# Patient Record
Sex: Male | Born: 1940 | Race: White | Hispanic: No | Smoking: Never smoker
Health system: Southern US, Community
[De-identification: ages and names within clinical notes are randomized; demographics above are authoritative.]

## PROBLEM LIST (undated history)

## (undated) DIAGNOSIS — N1831 Chronic kidney disease, stage 3a: Secondary | ICD-10-CM

## (undated) DIAGNOSIS — D485 Neoplasm of uncertain behavior of skin: Secondary | ICD-10-CM

## (undated) DIAGNOSIS — G40909 Epilepsy, unspecified, not intractable, without status epilepticus: Secondary | ICD-10-CM

## (undated) DIAGNOSIS — L03032 Cellulitis of left toe: Secondary | ICD-10-CM

## (undated) DIAGNOSIS — S46919A Strain of unspecified muscle, fascia and tendon at shoulder and upper arm level, unspecified arm, initial encounter: Secondary | ICD-10-CM

## (undated) DIAGNOSIS — E1140 Type 2 diabetes mellitus with diabetic neuropathy, unspecified: Secondary | ICD-10-CM

## (undated) DIAGNOSIS — E7800 Pure hypercholesterolemia, unspecified: Secondary | ICD-10-CM

## (undated) DIAGNOSIS — S239XXA Sprain of unspecified parts of thorax, initial encounter: Secondary | ICD-10-CM

## (undated) DIAGNOSIS — M1990 Unspecified osteoarthritis, unspecified site: Secondary | ICD-10-CM

## (undated) DIAGNOSIS — N183 Chronic kidney disease, stage 3 (moderate): Secondary | ICD-10-CM

## (undated) DIAGNOSIS — I10 Essential (primary) hypertension: Secondary | ICD-10-CM

## (undated) DIAGNOSIS — R0602 Shortness of breath: Secondary | ICD-10-CM

## (undated) DIAGNOSIS — N200 Calculus of kidney: Secondary | ICD-10-CM

## (undated) DIAGNOSIS — N393 Stress incontinence (female) (male): Secondary | ICD-10-CM

## (undated) DIAGNOSIS — M549 Dorsalgia, unspecified: Secondary | ICD-10-CM

## (undated) DIAGNOSIS — Z8546 Personal history of malignant neoplasm of prostate: Secondary | ICD-10-CM

## (undated) DIAGNOSIS — I2510 Atherosclerotic heart disease of native coronary artery without angina pectoris: Secondary | ICD-10-CM

## (undated) DIAGNOSIS — N3941 Urge incontinence: Secondary | ICD-10-CM

## (undated) DIAGNOSIS — C679 Malignant neoplasm of bladder, unspecified: Secondary | ICD-10-CM

## (undated) DIAGNOSIS — K219 Gastro-esophageal reflux disease without esophagitis: Secondary | ICD-10-CM

## (undated) DIAGNOSIS — S139XXA Sprain of joints and ligaments of unspecified parts of neck, initial encounter: Secondary | ICD-10-CM

## (undated) DIAGNOSIS — G4730 Sleep apnea, unspecified: Secondary | ICD-10-CM

## (undated) DIAGNOSIS — L600 Ingrowing nail: Secondary | ICD-10-CM

## (undated) HISTORY — DX: Atherosclerotic heart disease of native coronary artery without angina pectoris: I25.10

## (undated) HISTORY — DX: Cellulitis of left toe: L03.032

## (undated) HISTORY — DX: Chronic kidney disease, stage 3 (moderate): N18.3

## (undated) HISTORY — PX: URETEROSCOPY WITH HOLMIUM LASER LITHOTRIPSY: SHX6645

## (undated) HISTORY — DX: Malignant neoplasm of bladder, unspecified: C67.9

## (undated) HISTORY — DX: Ingrowing nail: L60.0

## (undated) HISTORY — DX: Pure hypercholesterolemia, unspecified: E78.00

## (undated) HISTORY — DX: Sprain of joints and ligaments of unspecified parts of neck, initial encounter: S13.9XXA

## (undated) HISTORY — DX: Sleep apnea, unspecified: G47.30

## (undated) HISTORY — DX: Type 2 diabetes mellitus with diabetic neuropathy, unspecified: E11.40

## (undated) HISTORY — DX: Essential (primary) hypertension: I10

## (undated) HISTORY — DX: Neoplasm of uncertain behavior of skin: D48.5

## (undated) HISTORY — DX: Personal history of malignant neoplasm of prostate: Z85.46

## (undated) HISTORY — DX: Calculus of kidney: N20.0

## (undated) HISTORY — DX: Sprain of unspecified parts of thorax, initial encounter: S23.9XXA

## (undated) HISTORY — DX: Stress incontinence (female) (male): N39.3

## (undated) HISTORY — PX: TRANSURETHRAL RESECTION OF BLADDER TUMOR WITH GYRUS (TURBT-GYRUS): SHX6458

## (undated) HISTORY — DX: Dorsalgia, unspecified: M54.9

## (undated) HISTORY — DX: Epilepsy, unspecified, not intractable, without status epilepticus: G40.909

## (undated) HISTORY — DX: Strain of unspecified muscle, fascia and tendon at shoulder and upper arm level, unspecified arm, initial encounter: S46.919A

## (undated) HISTORY — DX: Shortness of breath: R06.02

## (undated) HISTORY — DX: Urge incontinence: N39.41

---

## 2002-11-30 HISTORY — PX: PROSTATECTOMY: SHX69

## 2004-11-30 HISTORY — PX: CARDIAC SURGERY: SHX584

## 2014-11-30 HISTORY — PX: REPLACEMENT TOTAL KNEE: SUR1224

## 2017-05-17 ENCOUNTER — Ambulatory Visit (INDEPENDENT_AMBULATORY_CARE_PROVIDER_SITE_OTHER): Payer: Medicare Other | Admitting: Podiatry

## 2017-05-17 DIAGNOSIS — E1142 Type 2 diabetes mellitus with diabetic polyneuropathy: Secondary | ICD-10-CM

## 2017-05-17 DIAGNOSIS — M2041 Other hammer toe(s) (acquired), right foot: Secondary | ICD-10-CM

## 2017-05-17 DIAGNOSIS — M2042 Other hammer toe(s) (acquired), left foot: Secondary | ICD-10-CM

## 2017-05-17 NOTE — Progress Notes (Signed)
Subjective:     Patient ID: Elijah Oliver, male   DOB: [birth year], 76 y.o.   MRN: [id number]  HPI this patient presents the office for an evaluation of his diabetic feet. This patient is interested in obtaining diabetic footgear. He says he has moved from [place] and it is time for him to receive a new pair of diabetic shoes   Review of Systems     Objective:   Physical Exam GENERAL APPEARANCE: Alert, conversant. Appropriately groomed. No acute distress.  VASCULAR: Pedal pulses are  palpable at  [facility] and PT bilateral.  Capillary refill time is immediate to all digits,  Normal temperature gradient.  Digital hair growth is present bilateral  NEUROLOGIC: sensation is absent  to 5.07 monofilament at 5/5 sites bilateral.  Light touch is absent  bilateral, Muscle strength normal.  MUSCULOSKELETAL: acceptable muscle strength, tone and stability bilateral.  Hallux malleus  B/L  Hammer toes  B/l  DERMATOLOGIC: skin color, texture, and turgor are within normal limits.  No preulcerative lesions or ulcers  are seen, no interdigital maceration noted.  No open lesions present.  Digital nails are asymptomatic. No drainage noted.      Assessment:    Diabetic neuropathy      Plan:     Initial exam tissue was examined and is to obtain diabetic shoes. Patient is diagnosed with TPN and hammertoes and hallux malleus.  Patient to be called when the shoes arrive   Gardiner Barefoot DPM

## 2017-06-17 ENCOUNTER — Ambulatory Visit (INDEPENDENT_AMBULATORY_CARE_PROVIDER_SITE_OTHER): Payer: Medicare Other | Admitting: Podiatry

## 2017-06-17 DIAGNOSIS — M2041 Other hammer toe(s) (acquired), right foot: Secondary | ICD-10-CM | POA: Diagnosis not present

## 2017-06-17 DIAGNOSIS — M2042 Other hammer toe(s) (acquired), left foot: Secondary | ICD-10-CM | POA: Diagnosis not present

## 2017-06-17 DIAGNOSIS — E1142 Type 2 diabetes mellitus with diabetic polyneuropathy: Secondary | ICD-10-CM | POA: Diagnosis not present

## 2017-06-17 NOTE — Progress Notes (Signed)
Patient ID: Elijah Oliver, male   DOB: [birth year], 75 y.o.   MRN: [id number] Patient presents for diabetic shoe pick up, shoes are tried on for good fit.  Patient received 1 Pair and 3 pairs custom molded diabetic inserts.  Verbal and written break in and wear instructions given.  Patient will follow up for scheduled routine care  This patient presents the office to pick up his diabetic shoes.   GENERAL APPEARANCE: Alert, conversant. Appropriately groomed. No acute distress.  VASCULAR: Pedal pulses are  palpable at  [facility] and PT bilateral.  Capillary refill time is immediate to all digits,  Normal temperature gradient.  Digital hair growth is present bilateral  NEUROLOGIC: sensation is normal to 5.07 monofilament at 5/5 sites bilateral.  Light touch is intact bilateral, Muscle strength normal.  MUSCULOSKELETAL: acceptable muscle strength, tone and stability bilateral.  Hallux malleus  Hammer toes  B/L  DERMATOLOGIC: skin color, texture, and turgor are within normal limits.  No preulcerative lesions or ulcers  are seen, no interdigital maceration noted.  No open lesions present.  Digital nails are asymptomatic. No drainage noted.  DPN  Hallux malleus  Hammer toes  ROV  Dispense shoes.  Patient presents today and was dispensed 0ne pair ( two units) of medically necessary extra depth shoes with three pair( six units) of custom molded multiple density inserts. The shoes and the inserts are fitted to the patients ' feet and are noted to fit well and are free of defect.  Length and width of the shoes are also acceptable.  Patient was given written and verbal  instructions for wearing.  If any concerns arrive with the shoes or inserts, the patient is to call the office.Patient is to follow up with doctor in six weeks.   Gardiner Barefoot DPM

## 2017-06-17 NOTE — Patient Instructions (Signed)

## 2017-07-02 DIAGNOSIS — I25810 Atherosclerosis of coronary artery bypass graft(s) without angina pectoris: Secondary | ICD-10-CM | POA: Insufficient documentation

## 2017-07-02 DIAGNOSIS — G40909 Epilepsy, unspecified, not intractable, without status epilepticus: Secondary | ICD-10-CM

## 2017-07-02 DIAGNOSIS — Z8546 Personal history of malignant neoplasm of prostate: Secondary | ICD-10-CM

## 2017-07-02 DIAGNOSIS — E7800 Pure hypercholesterolemia, unspecified: Secondary | ICD-10-CM

## 2017-07-02 DIAGNOSIS — E1140 Type 2 diabetes mellitus with diabetic neuropathy, unspecified: Secondary | ICD-10-CM

## 2017-07-02 DIAGNOSIS — E669 Obesity, unspecified: Secondary | ICD-10-CM | POA: Insufficient documentation

## 2017-07-02 HISTORY — DX: Epilepsy, unspecified, not intractable, without status epilepticus: G40.909

## 2017-07-02 HISTORY — DX: Type 2 diabetes mellitus with diabetic neuropathy, unspecified: E11.40

## 2017-07-02 HISTORY — DX: Personal history of malignant neoplasm of prostate: Z85.46

## 2017-07-02 HISTORY — DX: Pure hypercholesterolemia, unspecified: E78.00

## 2017-07-06 DIAGNOSIS — N183 Chronic kidney disease, stage 3 unspecified: Secondary | ICD-10-CM | POA: Insufficient documentation

## 2017-07-06 HISTORY — DX: Chronic kidney disease, stage 3 unspecified: N18.30

## 2017-07-13 DIAGNOSIS — R569 Unspecified convulsions: Secondary | ICD-10-CM | POA: Insufficient documentation

## 2017-07-22 ENCOUNTER — Other Ambulatory Visit: Payer: Self-pay

## 2017-07-22 DIAGNOSIS — R32 Unspecified urinary incontinence: Secondary | ICD-10-CM

## 2017-07-23 ENCOUNTER — Encounter: Payer: Self-pay | Admitting: Urology

## 2017-07-23 ENCOUNTER — Ambulatory Visit (INDEPENDENT_AMBULATORY_CARE_PROVIDER_SITE_OTHER): Payer: Medicare Other | Admitting: Urology

## 2017-07-23 ENCOUNTER — Other Ambulatory Visit
Admission: RE | Admit: 2017-07-23 | Discharge: 2017-07-23 | Disposition: A | Discharge status: Home / Self Care | Payer: Medicare Other | Source: Ambulatory Visit | Attending: Urology | Admitting: Urology

## 2017-07-23 VITALS — BP 143/62 | HR 65 | Ht 70.0 in | Wt 214.0 lb

## 2017-07-23 DIAGNOSIS — R3915 Urgency of urination: Secondary | ICD-10-CM

## 2017-07-23 DIAGNOSIS — M7989 Other specified soft tissue disorders: Secondary | ICD-10-CM | POA: Insufficient documentation

## 2017-07-23 DIAGNOSIS — N393 Stress incontinence (female) (male): Secondary | ICD-10-CM

## 2017-07-23 DIAGNOSIS — Z8546 Personal history of malignant neoplasm of prostate: Secondary | ICD-10-CM

## 2017-07-23 DIAGNOSIS — S46919A Strain of unspecified muscle, fascia and tendon at shoulder and upper arm level, unspecified arm, initial encounter: Secondary | ICD-10-CM | POA: Insufficient documentation

## 2017-07-23 DIAGNOSIS — S239XXA Sprain of unspecified parts of thorax, initial encounter: Secondary | ICD-10-CM | POA: Insufficient documentation

## 2017-07-23 DIAGNOSIS — I2510 Atherosclerotic heart disease of native coronary artery without angina pectoris: Secondary | ICD-10-CM | POA: Insufficient documentation

## 2017-07-23 DIAGNOSIS — E119 Type 2 diabetes mellitus without complications: Secondary | ICD-10-CM | POA: Insufficient documentation

## 2017-07-23 DIAGNOSIS — N3941 Urge incontinence: Secondary | ICD-10-CM

## 2017-07-23 DIAGNOSIS — R32 Unspecified urinary incontinence: Secondary | ICD-10-CM

## 2017-07-23 DIAGNOSIS — Z8551 Personal history of malignant neoplasm of bladder: Secondary | ICD-10-CM

## 2017-07-23 DIAGNOSIS — N200 Calculus of kidney: Secondary | ICD-10-CM

## 2017-07-23 DIAGNOSIS — L03032 Cellulitis of left toe: Secondary | ICD-10-CM

## 2017-07-23 DIAGNOSIS — R3129 Other microscopic hematuria: Secondary | ICD-10-CM | POA: Diagnosis not present

## 2017-07-23 DIAGNOSIS — H00019 Hordeolum externum unspecified eye, unspecified eyelid: Secondary | ICD-10-CM | POA: Insufficient documentation

## 2017-07-23 DIAGNOSIS — Z87442 Personal history of urinary calculi: Secondary | ICD-10-CM | POA: Diagnosis not present

## 2017-07-23 DIAGNOSIS — D485 Neoplasm of uncertain behavior of skin: Secondary | ICD-10-CM

## 2017-07-23 DIAGNOSIS — I10 Essential (primary) hypertension: Secondary | ICD-10-CM

## 2017-07-23 DIAGNOSIS — S139XXA Sprain of joints and ligaments of unspecified parts of neck, initial encounter: Secondary | ICD-10-CM

## 2017-07-23 DIAGNOSIS — M549 Dorsalgia, unspecified: Secondary | ICD-10-CM

## 2017-07-23 DIAGNOSIS — L600 Ingrowing nail: Secondary | ICD-10-CM | POA: Insufficient documentation

## 2017-07-23 DIAGNOSIS — R0602 Shortness of breath: Secondary | ICD-10-CM | POA: Insufficient documentation

## 2017-07-23 DIAGNOSIS — G4730 Sleep apnea, unspecified: Secondary | ICD-10-CM | POA: Insufficient documentation

## 2017-07-23 DIAGNOSIS — C679 Malignant neoplasm of bladder, unspecified: Secondary | ICD-10-CM | POA: Insufficient documentation

## 2017-07-23 HISTORY — DX: Urge incontinence: N39.41

## 2017-07-23 HISTORY — DX: Calculus of kidney: N20.0

## 2017-07-23 HISTORY — DX: Neoplasm of uncertain behavior of skin: D48.5

## 2017-07-23 HISTORY — DX: Sprain of joints and ligaments of unspecified parts of neck, initial encounter: S13.9XXA

## 2017-07-23 HISTORY — DX: Ingrowing nail: L60.0

## 2017-07-23 HISTORY — DX: Shortness of breath: R06.02

## 2017-07-23 HISTORY — DX: Stress incontinence (female) (male): N39.3

## 2017-07-23 HISTORY — DX: Sleep apnea, unspecified: G47.30

## 2017-07-23 HISTORY — DX: Cellulitis of left toe: L03.032

## 2017-07-23 HISTORY — DX: Sprain of unspecified parts of thorax, initial encounter: S23.9XXA

## 2017-07-23 HISTORY — DX: Dorsalgia, unspecified: M54.9

## 2017-07-23 HISTORY — DX: Atherosclerotic heart disease of native coronary artery without angina pectoris: I25.10

## 2017-07-23 HISTORY — DX: Malignant neoplasm of bladder, unspecified: C67.9

## 2017-07-23 HISTORY — DX: Strain of unspecified muscle, fascia and tendon at shoulder and upper arm level, unspecified arm, initial encounter: S46.919A

## 2017-07-23 HISTORY — DX: Essential (primary) hypertension: I10

## 2017-07-23 LAB — URINALYSIS, COMPLETE (UACMP) WITH MICROSCOPIC
Bacteria, UA: NONE SEEN
Bilirubin Urine: NEGATIVE
Glucose, UA: NEGATIVE mg/dL
KETONES UR: NEGATIVE mg/dL
LEUKOCYTES UA: NEGATIVE
NITRITE: NEGATIVE
PROTEIN: 30 mg/dL — AB
SQUAMOUS EPITHELIAL / LPF: NONE SEEN
Specific Gravity, Urine: 1.02 (ref 1.005–1.030)
pH: 5.5 (ref 5.0–8.0)

## 2017-07-23 LAB — BLADDER SCAN AMB NON-IMAGING

## 2017-07-23 NOTE — Progress Notes (Signed)
07/23/2017 8:33 PM   Elijah Oliver 16-Jan-1941 [id number]  Referring provider: Dion Body, MD [facility] [place], [place] [address]  Chief Complaint  Patient presents with  . Urinary Incontinence    New Patient    HPI: 76 year old male is establishing care after moving back to [place]. He is originally from [place] but moved to [place] 30 years ago for his job. He since retired and his wife passed away.   History of prostate cancer Personal history of prostate cancer Prostatectomy 2004, open radical.  No recurrence.  PSA 07/06/17 in 0.01.  SUI AUS placed 2008, revised 08 to double cuff.   Works OK, wears 5-6 ppd but changes frequently for hygiene reasons  Urinary urgency Previously on oxybutynin and transitioned to myrbetriq 50 mg.  Ran out of medication for over a week and did not notice much difference in his symptoms. Worried about cost of medication.  History of kidney stones ESWL x 3 (2013, 2013, 2015) No recent flank pain.  No stones on CT from 11/2016.  History of bladder cancer TgTa TCC 01/2012 incidental at the time of stone surgery No recurrence, last cysto 01/2016.  CKD Baseline Cr 1.6  History of urosepsis Recent hospital admission 11/2016 from E. Coli CT scan abd/ pelvis w/o contrast 11/2016 describes "fuzziness" in left UJP on report, unable to visualize scan.  No repeat imaging.    Erectile dysfuction Baseline severe ED Not interested in treatment at this time   Microscopic hematuria  Incidental microscopic hematuria today on UA  PMH: Past Medical History:  Diagnosis Date  . Backache 07/23/2017  . Benign essential hypertension 07/23/2017  . Bladder cancer ([place]) 07/23/2017  . Cellulitis of great toe of left foot 07/23/2017  . CKD (chronic kidney disease) stage 3, GFR 30-59 ml/min 07/06/2017  . 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   Overview:  Sequential left internal mammary to the mid LAD and distal  LAD, SVG to the second obtuse marginal and SVG to the right coronary artery. June 24th, 2006 Dr. Jerrye Noble  . History of prostate cancer 07/02/2017  . Ingrowing nail with infection 07/23/2017  . 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 Neck sprain 07/23/2017  . Neoplasm of uncertain behavior of skin 07/23/2017  . Nephrolithiasis 07/23/2017  . Pure hypercholesterolemia 07/02/2017  . Seizure disorder ([place]) 07/02/2017  . Shoulder strain 07/23/2017  . Sleep apnea 07/23/2017  . SOB (shortness of breath) 07/23/2017  . Thoracic sprain 07/23/2017  . Type 2 diabetes mellitus with diabetic neuropathy, without long-term current use of insulin ([place]) 07/02/2017  . Urge incontinence of urine 07/23/2017    Surgical History: Past Surgical History:  Procedure Laterality Date  . CARDIAC SURGERY  2006  . PROSTATECTOMY  2004  . REPLACEMENT TOTAL KNEE  2016  . TRANSURETHRAL RESECTION OF BLADDER TUMOR WITH GYRUS (TURBT-GYRUS)  2010?  Marland Kitchen URETEROSCOPY WITH HOLMIUM LASER LITHOTRIPSY  2010?    Home Medications:  Allergies as of 07/23/2017   No Known Allergies     Medication List       Accurate as of 07/23/17  8:33 PM. Always use your most recent med list.          aspirin 81 MG chewable tablet Chew by mouth.   atorvastatin 20 MG tablet Commonly known as:  LIPITOR for prevention of cerebrovascular accident. TAKE 1 TABLET DAILY   atorvastatin 40 MG tablet Commonly known as:  LIPITOR Take 40 mg by mouth daily.   carvedilol 3.125  MG tablet Commonly known as:  COREG TAKE 1 TABLET (3.125 MG TOTAL) BY MOUTH 2 (TWO) TIMES DAILY.   levETIRAcetam 500 MG tablet Commonly known as:  KEPPRA Take by mouth.   losartan 25 MG tablet Commonly known as:  COZAAR Take 25 mg by mouth daily.   mirabegron ER 50 MG Tb24 tablet Commonly known as:  MYRBETRIQ Take by mouth.            Discharge Care Instructions        Start     Ordered   07/23/17 0000  BLADDER SCAN AMB NON-IMAGING     07/23/17 1439       Allergies: No Known Allergies  Family History: Family History  Problem Relation Age of Onset  . Tuberculosis Father   . Prostate cancer Brother     Social History:  reports that he has never smoked. He has never used smokeless tobacco. He reports that he does not drink alcohol or use drugs.  ROS: UROLOGY Frequent Urination?: No Hard to postpone urination?: No Burning/pain with urination?: No Get up at night to urinate?: No Leakage of urine?: Yes Urine stream starts and stops?: No Trouble starting stream?: No Do you have to strain to urinate?: No Blood in urine?: No Urinary tract infection?: Yes Sexually transmitted disease?: No Injury to kidneys or bladder?: No Painful intercourse?: No Weak stream?: No Erection problems?: Yes Penile pain?: No  Gastrointestinal Nausea?: No Vomiting?: No Indigestion/heartburn?: No Diarrhea?: No Constipation?: No  Constitutional Fever: No Night sweats?: No Weight loss?: No Fatigue?: No  Skin Skin rash/lesions?: No Itching?: No  Eyes Blurred vision?: No Double vision?: No  Ears/Nose/Throat Sore throat?: No Sinus problems?: No  Hematologic/Lymphatic Swollen glands?: No Easy bruising?: No  Cardiovascular Leg swelling?: No Chest pain?: No  Respiratory Cough?: No Shortness of breath?: No  Endocrine Excessive thirst?: No  Musculoskeletal Back pain?: No Joint pain?: No  Neurological Headaches?: No Dizziness?: No  Psychologic Depression?: No Anxiety?: No  Physical Exam: BP (!) 143/62   Pulse 65   Ht 5\' 10"  (1.778 m)   Wt 214 lb (97.1 kg)   BMI 30.71 kg/m   Constitutional:  Alert and oriented, No acute distress. HEENT: [place] AT, moist mucus membranes.  Trachea midline, no masses. Cardiovascular: No clubbing, cyanosis, or edema. Respiratory: Normal respiratory effort, no increased work of breathing. GI: Abdomen is soft, nontender, nondistended, no abdominal masses GU: No CVA tenderness.  Skin: No  rashes, bruises or suspicious lesions. Neurologic: Grossly intact, no focal deficits, moving all 4 extremities. Psychiatric: Normal mood and affect.  Laboratory Data: Labs from care everywhere reviewed- 07/06/17  Urinalysis Component     Latest Ref Rng & Units 07/23/2017  Color, Urine     YELLOW YELLOW  Appearance     CLEAR CLEAR  Specific Gravity, Urine     1.005 - 1.030 1.020  pH     5.0 - 8.0 5.5  Glucose     NEGATIVE mg/dL NEGATIVE  Hgb urine dipstick     NEGATIVE SMALL (A)  Bilirubin Urine     NEGATIVE NEGATIVE  Ketones, ur     NEGATIVE mg/dL NEGATIVE  Protein     NEGATIVE mg/dL 30 (A)  Nitrite     NEGATIVE NEGATIVE  Leukocytes, UA     NEGATIVE NEGATIVE  Squamous Epithelial / LPF     NONE SEEN NONE SEEN  WBC, UA     0 - 5 WBC/hpf 0-5  RBC / HPF  0 - 5 RBC/hpf 6-30  Bacteria, UA     NONE SEEN NONE SEEN   Pertinent Imaging: CT report from 11/2016 reviewed- requested images  Results for orders placed or performed in visit on 07/23/17  BLADDER SCAN AMB NON-IMAGING  Result Value Ref Range   Scan Result 3ml     Assessment & Plan:    1. History of prostate cancer NED, most recent PSA 0.01 06/2017 Annual PSA  2. Stress incontinence, male Bladder scan minimal, no evidence of overflow is contributing factor AUS functioning reasonably well - BLADDER SCAN AMB NON-IMAGING  3. Urinary urgency Currently on Mybetriq 50 mg Given recent episode of minimal change in urinary symptoms after 1 week of no meds, advised taking a break from this medication and keeping a log to see if this medication truly is effective May consider transition back to oxybutynin due to cost concern  4. History of kidney stones No stones on most recent cross-sectional CT scan 11/2016  5. History of bladder cancer Remote history of bladder cancer as above Overdue for cystoscopy, we'll schedule for in the near future Discussed report from most recent CT scan during admission for sepsis,  advised to obtain disc for review- ddx includes upper tract urothelial cancer versus infectious etiologies based on description, may need f/u imaging  6. Microscopic hematuria Incidental microscopic hematuria today, overdue for scope   F/u in 2 weeks for cysto, review CT scan  Hollice Espy, MD  [facility] [address], [place], [place] [address] [phone]  I spent 60 min with this patient of which greater than 50% was spent in counseling and coordination of care with the patient.   Extensive review of outside records from [place] on care everywhere.

## 2017-08-05 ENCOUNTER — Other Ambulatory Visit: Payer: Self-pay | Admitting: *Deleted

## 2017-08-05 DIAGNOSIS — Z8551 Personal history of malignant neoplasm of bladder: Secondary | ICD-10-CM

## 2017-08-06 ENCOUNTER — Encounter: Payer: Self-pay | Admitting: Urology

## 2017-08-06 ENCOUNTER — Other Ambulatory Visit
Admission: RE | Admit: 2017-08-06 | Discharge: 2017-08-06 | Disposition: A | Discharge status: Home / Self Care | Payer: Medicare Other | Source: Ambulatory Visit | Attending: Urology | Admitting: Urology

## 2017-08-06 ENCOUNTER — Ambulatory Visit (INDEPENDENT_AMBULATORY_CARE_PROVIDER_SITE_OTHER): Payer: Medicare Other | Admitting: Urology

## 2017-08-06 VITALS — BP 156/69 | HR 65 | Ht 70.0 in | Wt 217.0 lb

## 2017-08-06 DIAGNOSIS — Z8551 Personal history of malignant neoplasm of bladder: Secondary | ICD-10-CM | POA: Diagnosis present

## 2017-08-06 DIAGNOSIS — R3915 Urgency of urination: Secondary | ICD-10-CM

## 2017-08-06 LAB — URINALYSIS, COMPLETE (UACMP) WITH MICROSCOPIC
BACTERIA UA: NONE SEEN
Bilirubin Urine: NEGATIVE
GLUCOSE, UA: NEGATIVE mg/dL
KETONES UR: NEGATIVE mg/dL
LEUKOCYTES UA: NEGATIVE
Nitrite: NEGATIVE
PH: 5 (ref 5.0–8.0)
Protein, ur: 100 mg/dL — AB
Specific Gravity, Urine: 1.025 (ref 1.005–1.030)
Squamous Epithelial / LPF: NONE SEEN

## 2017-08-06 MED ORDER — OXYBUTYNIN CHLORIDE ER 15 MG PO TB24: 15.0000 mg | ORAL_TABLET | Freq: Every day | ORAL | 0 refills | Status: DC

## 2017-08-06 MED ORDER — LIDOCAINE HCL 2 % EX GEL
1.0000 "application " | Freq: Once | CUTANEOUS | Status: AC
  Administered 2017-08-06: 1 via URETHRAL

## 2017-08-06 MED ORDER — CIPROFLOXACIN HCL 500 MG PO TABS
500.0000 mg | ORAL_TABLET | Freq: Once | ORAL | Status: AC
  Administered 2017-08-06: 500 mg via ORAL

## 2017-08-06 NOTE — Progress Notes (Signed)
   08/06/17  CC:  Chief Complaint  Patient presents with  . Cysto    HPI: 76 year old patient with a history of bladder cancer who presents today for cystoscopy for surveillance purposes. Additionally, he has a history of prostatectomy status post tandem cuff artificial urinary sphincter. Further discussion today, it sounds like he does not cycle his cough to void and is incontinent despite leaving his cuff chronically inflated.  Additionally today, on her last visit we discussed stopping Mybetriq to see if there is any significant effect and he did experience worsening urgency, frequency, and incontinence. Since resuming the medication, he's had more bladder control. He would like to try oxybutynin as an alternative due to cost concerns.  See previous note for details.  Blood pressure (!) 156/69, pulse 65, height 5\' 10"  (1.778 m), weight 217 lb (98.4 kg). NED. A&Ox3.   No respiratory distress   Abd soft, NT, ND Normal phallus with bilateral descended testicles  Cystoscopy Procedure Note  Patient identification was confirmed, informed consent was obtained, and patient was prepped using Betadine solution.  Lidocaine jelly was administered per urethral meatus.    Preoperative abx where received prior to procedure.     Pre-Procedure: - Inspection reveals a normal caliber ureteral meatus.  Procedure: The flexible cystoscope was introduced without difficulty - No urethral strictures/lesions are present.  There is a tandem cuff sphincter in his bulbar urethra. Upon cycling the cuff, the urethra did open more patently to allow the scope to pass. I did observe the cuff cycle successfully multiple times. No urethral erosions appreciated. - Surgically absent prostate  - Normal bladder neck - Bilateral ureteral orifices identified - Bladder mucosa  reveals no ulcers, tumors, or lesions - No bladder stones - No trabeculation  Retroflexion unremarkable   Post-Procedure: - Patient  tolerated the procedure well  Assessment/ Plan:  1. History of bladder cancer S/p negative surveillance cystoscopy We'll continue annual screening - ciprofloxacin (CIPRO) tablet 500 mg; Take 1 tablet (500 mg total) by mouth once. - lidocaine (XYLOCAINE) 2 % jelly 1 application; Place 1 application into the urethra once.  2. Urinary urgency/ stress incontinence Interested in trying oxybutynin in lieu of Mybetriq for cost concerns, will call in prescription Recheck at next visit Cuff cycles appropriately- attempted AUS teaching today but due to neuropathy and dexterity, unable to personally cycle the cuff Appears to be emptying bladder fairly well despite this- check PVR next visit  Return in about 1 month (around 09/05/2017) for f/u CT scan (will bring disk), PVR.  Hollice Espy, MD

## 2017-09-01 ENCOUNTER — Other Ambulatory Visit: Payer: Self-pay

## 2017-09-01 DIAGNOSIS — N3281 Overactive bladder: Secondary | ICD-10-CM

## 2017-09-01 MED ORDER — OXYBUTYNIN CHLORIDE ER 15 MG PO TB24: 15.0000 mg | ORAL_TABLET | Freq: Every day | ORAL | 0 refills | Status: DC

## 2017-09-10 ENCOUNTER — Ambulatory Visit (INDEPENDENT_AMBULATORY_CARE_PROVIDER_SITE_OTHER): Payer: Medicare Other | Admitting: Urology

## 2017-09-10 ENCOUNTER — Encounter: Payer: Self-pay | Admitting: Urology

## 2017-09-10 VITALS — BP 149/74 | HR 67 | Ht 71.0 in | Wt 215.0 lb

## 2017-09-10 DIAGNOSIS — N3941 Urge incontinence: Secondary | ICD-10-CM

## 2017-09-10 DIAGNOSIS — R3129 Other microscopic hematuria: Secondary | ICD-10-CM

## 2017-09-10 DIAGNOSIS — R3915 Urgency of urination: Secondary | ICD-10-CM | POA: Diagnosis not present

## 2017-09-10 DIAGNOSIS — Z8546 Personal history of malignant neoplasm of prostate: Secondary | ICD-10-CM

## 2017-09-10 DIAGNOSIS — Z87442 Personal history of urinary calculi: Secondary | ICD-10-CM | POA: Diagnosis not present

## 2017-09-10 DIAGNOSIS — N393 Stress incontinence (female) (male): Secondary | ICD-10-CM

## 2017-09-10 DIAGNOSIS — Z8551 Personal history of malignant neoplasm of bladder: Secondary | ICD-10-CM | POA: Diagnosis not present

## 2017-09-10 LAB — BLADDER SCAN AMB NON-IMAGING

## 2017-09-10 NOTE — Progress Notes (Signed)
09/10/2017 2:05 PM   Elijah Oliver July 05, 1941 [id number]  Referring provider: Dion Body, MD [facility] [place], [place] [address]  Chief Complaint  Patient presents with  . History of Bladder Cancer    CTscan results and PVR    HPI: 76 year old male with multiple GU issues who returns today to f/u CT scan.  History of prostate cancer Personal history of prostate cancer Prostatectomy 2004, open radical.  No recurrence.  PSA 07/06/17 in 0.01.  SUI AUS placed 2008, revised 08 to double cuff.   Works OK, wears up to 5-6 ppd but changes frequently for hygiene reasons More active he is the more he leaks  Reports he was never taught how to cycle the cuff and voids through the cuff without difficulty, leaks.  Unable to compress scrotal pump due to dexterity and arthritis.  Urinary urgency Improved on myrbetriq 50 mg Tried interval off of medication which was worse Oxybutynin not as good as mybetriq 50 mg, requests resuming this medication  History of kidney stones ESWL x 3 (2013, 2013, 2015) No recent flank pain.  No stones on CT from 11/2016.  History of bladder cancer TgTa TCC 01/2012 incidental at the time of stone surgery No recurrence, last cysto 07/2017, annual surviellence  CKD Baseline Cr 1.6  History of urosepsis Recent hospital admission 11/2016 from E. Coli CT scan abd/ pelvis w/o contrast 11/2016 describes "fuzziness" in left UJP  --> reviewed scan personally today, very subtile in setting of active infection  Erectile dysfuction Baseline severe ED Not interested in treatment at this time   Microscopic hematuria  Incidental microscopic hematuria today on UA S/p cysto and review of CT abd/ pelvis as above   PMH: Past Medical History:  Diagnosis Date  . Backache 07/23/2017  . Benign essential hypertension 07/23/2017  . Bladder cancer ([place]) 07/23/2017  . Cellulitis of great toe of left foot 07/23/2017  . CKD (chronic  kidney disease) stage 3, GFR 30-59 ml/min ([place]) 07/06/2017  . 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   Overview:  Sequential left internal mammary to the mid LAD and distal LAD, SVG to the second obtuse marginal and SVG to the right coronary artery. June 24th, 2006 Dr. Jerrye Noble  . History of prostate cancer 07/02/2017  . Ingrowing nail with infection 07/23/2017  . 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 Neck sprain 07/23/2017  . Neoplasm of uncertain behavior of skin 07/23/2017  . Nephrolithiasis 07/23/2017  . Pure hypercholesterolemia 07/02/2017  . Seizure disorder ([place]) 07/02/2017  . Shoulder strain 07/23/2017  . Sleep apnea 07/23/2017  . SOB (shortness of breath) 07/23/2017  . Thoracic sprain 07/23/2017  . Type 2 diabetes mellitus with diabetic neuropathy, without long-term current use of insulin ([place]) 07/02/2017  . Urge incontinence of urine 07/23/2017    Surgical History: Past Surgical History:  Procedure Laterality Date  . CARDIAC SURGERY  2006  . PROSTATECTOMY  2004  . REPLACEMENT TOTAL KNEE  2016  . TRANSURETHRAL RESECTION OF BLADDER TUMOR WITH GYRUS (TURBT-GYRUS)  2010?  Marland Kitchen URETEROSCOPY WITH HOLMIUM LASER LITHOTRIPSY  2010?    Home Medications:  Allergies as of 09/10/2017   No Known Allergies     Medication List       Accurate as of 09/10/17  2:05 PM. Always use your most recent med list.          aspirin 81 MG chewable tablet Chew by mouth.   atorvastatin 20 MG tablet Commonly known as:  LIPITOR for  prevention of cerebrovascular accident. TAKE 1 TABLET DAILY   atorvastatin 40 MG tablet Commonly known as:  LIPITOR Take 40 mg by mouth daily.   carvedilol 3.125 MG tablet Commonly known as:  COREG TAKE 1 TABLET (3.125 MG TOTAL) BY MOUTH 2 (TWO) TIMES DAILY.   levETIRAcetam 500 MG tablet Commonly known as:  KEPPRA Take 500 mg by mouth 4 (four) times daily.   losartan 25 MG tablet Commonly known as:  COZAAR Take 25 mg by mouth daily.   mirabegron ER 50 MG Tb24  tablet Commonly known as:  MYRBETRIQ Take by mouth.   oxybutynin 15 MG 24 hr tablet Commonly known as:  DITROPAN XL Take 1 tablet (15 mg total) by mouth daily.       Allergies: No Known Allergies  Family History: Family History  Problem Relation Age of Onset  . Tuberculosis Father   . Prostate cancer Brother   . Kidney cancer Neg Hx   . Bladder Cancer Neg Hx     Social History:  reports that he has never smoked. He has never used smokeless tobacco. He reports that he does not drink alcohol or use drugs.  ROS: UROLOGY Frequent Urination?: No Hard to postpone urination?: No Burning/pain with urination?: No Get up at night to urinate?: No Leakage of urine?: No Urine stream starts and stops?: No Trouble starting stream?: No Do you have to strain to urinate?: No Blood in urine?: No Urinary tract infection?: No Sexually transmitted disease?: No Injury to kidneys or bladder?: No Painful intercourse?: No Weak stream?: No Erection problems?: No Penile pain?: No  Gastrointestinal Nausea?: No Vomiting?: No Indigestion/heartburn?: No Diarrhea?: No Constipation?: No  Constitutional Fever: No Night sweats?: No Weight loss?: No Fatigue?: No  Skin Skin rash/lesions?: No Itching?: No  Eyes Blurred vision?: No Double vision?: No  Ears/Nose/Throat Sore throat?: No Sinus problems?: No  Hematologic/Lymphatic Swollen glands?: No Easy bruising?: No  Cardiovascular Leg swelling?: No Chest pain?: No  Respiratory Cough?: No Shortness of breath?: No  Endocrine Excessive thirst?: No  Musculoskeletal Back pain?: No Joint pain?: No  Neurological Headaches?: No Dizziness?: No  Psychologic Depression?: No Anxiety?: No  Physical Exam: BP (!) 149/74   Pulse 67   Ht 5\' 11"  (1.803 m)   Wt 215 lb (97.5 kg)   BMI 29.99 kg/m   Constitutional:  Alert and oriented, No acute distress. HEENT: [place] AT, moist mucus membranes.  Trachea midline, no  masses. Cardiovascular: No clubbing, cyanosis, or edema. Respiratory: Normal respiratory effort, no increased work of breathing. GI: Abdomen is soft, nontender, nondistended, no abdominal masses GU: No CVA tenderness.  Skin: No rashes, bruises or suspicious lesions. Neurologic: Grossly intact, no focal deficits, moving all 4 extremities. Psychiatric: Normal mood and affect.  Laboratory Data: Labs from care everywhere reviewed- 07/06/17  Urinalysis Component     Latest Ref Rng & Units 07/23/2017  Color, Urine     YELLOW YELLOW  Appearance     CLEAR CLEAR  Specific Gravity, Urine     1.005 - 1.030 1.020  pH     5.0 - 8.0 5.5  Glucose     NEGATIVE mg/dL NEGATIVE  Hgb urine dipstick     NEGATIVE SMALL (A)  Bilirubin Urine     NEGATIVE NEGATIVE  Ketones, ur     NEGATIVE mg/dL NEGATIVE  Protein     NEGATIVE mg/dL 30 (A)  Nitrite     NEGATIVE NEGATIVE  Leukocytes, UA     NEGATIVE NEGATIVE  Squamous Epithelial / LPF     NONE SEEN NONE SEEN  WBC, UA     0 - 5 WBC/hpf 0-5  RBC / HPF     0 - 5 RBC/hpf 6-30  Bacteria, UA     NONE SEEN NONE SEEN   Pertinent Imaging: CT report from 11/2016 reviewed- Is brought today which was examined personally. There is a very subtle enhancement of the left collecting system which appears to be consistent with his infection in the setting of urosepsis at the time.   Results for orders placed or performed during the hospital encounter of 08/06/17  Urinalysis, Complete w Microscopic  Result Value Ref Range   Color, Urine YELLOW YELLOW   APPearance CLEAR CLEAR   Specific Gravity, Urine 1.025 1.005 - 1.030   pH 5.0 5.0 - 8.0   Glucose, UA NEGATIVE NEGATIVE mg/dL   Hgb urine dipstick SMALL (A) NEGATIVE   Bilirubin Urine NEGATIVE NEGATIVE   Ketones, ur NEGATIVE NEGATIVE mg/dL   Protein, ur 100 (A) NEGATIVE mg/dL   Nitrite NEGATIVE NEGATIVE   Leukocytes, UA NEGATIVE NEGATIVE   Squamous Epithelial / LPF NONE SEEN NONE SEEN   WBC, UA 0-5 0 - 5  WBC/hpf   RBC / HPF 0-5 0 - 5 RBC/hpf   Bacteria, UA NONE SEEN NONE SEEN    Assessment & Plan:    1. History of prostate cancer NED, most recent PSA 0.01 06/2017 Annual PSA  2. Stress incontinence, male Bladder scan minimal, no evidence of overflow is contributing factor AUS functioning reasonably well but unable to use due to dexterity/ neuropathy Despite cuff not being used, able to void to completeness - BLADDER SCAN AMB NON-IMAGING  3. Urinary urgency Currently on Mybetriq 50 mg  4. History of kidney stones No stones on most recent cross-sectional CT scan 11/2016  5. History of bladder cancer Remote history of bladder cancer as above Cysto 07/2017 unremarkable CT scan reviewed today- most consistent with infection rathert han malignancy  6. Microscopic hematuria Incidental microscopic on first visit Cystoscopy up-to-date CT reviewed as above No delayed imaging but given renal function, unable to have IV contrast Offered bilateral retrograde pyelogram to complete workup which is most comprehensive, discussed risk and benefits of this. He is not interested in undergoing any surgical procedure at this time for multiple reasons. He understands that there is a risk for missed or undiagnosed upper tract urothelial cancer and is willing to accept this risk. This is discussed at length today.  Return in about 1 year (around 09/10/2018) for PSA/ cysto/ PVR.  Hollice Espy, MD  [facility] [address], [place], [place] [address] [phone]  I spent 25 min with this patient of which greater than 50% was spent in counseling and coordination of care with the patient.   Extensive review of outside records from [place] on care everywhere.

## 2017-12-09 ENCOUNTER — Other Ambulatory Visit: Payer: Self-pay

## 2017-12-09 ENCOUNTER — Encounter: Payer: Self-pay | Admitting: Urology

## 2017-12-09 MED ORDER — MIRABEGRON ER 50 MG PO TB24: 50.0000 mg | ORAL_TABLET | Freq: Every day | ORAL | 6 refills | Status: DC

## 2018-03-07 DIAGNOSIS — Z66 Do not resuscitate: Secondary | ICD-10-CM | POA: Insufficient documentation

## 2018-06-30 ENCOUNTER — Encounter: Payer: Self-pay | Admitting: Podiatry

## 2018-06-30 ENCOUNTER — Ambulatory Visit (INDEPENDENT_AMBULATORY_CARE_PROVIDER_SITE_OTHER): Payer: Medicare Other | Admitting: Podiatry

## 2018-06-30 DIAGNOSIS — M79675 Pain in left toe(s): Secondary | ICD-10-CM | POA: Diagnosis not present

## 2018-06-30 DIAGNOSIS — M79674 Pain in right toe(s): Secondary | ICD-10-CM

## 2018-06-30 DIAGNOSIS — M2041 Other hammer toe(s) (acquired), right foot: Secondary | ICD-10-CM

## 2018-06-30 DIAGNOSIS — B351 Tinea unguium: Secondary | ICD-10-CM

## 2018-06-30 DIAGNOSIS — M2042 Other hammer toe(s) (acquired), left foot: Secondary | ICD-10-CM

## 2018-06-30 DIAGNOSIS — E1142 Type 2 diabetes mellitus with diabetic polyneuropathy: Secondary | ICD-10-CM

## 2018-06-30 NOTE — Progress Notes (Signed)
Complaint:  Visit Type: Patient returns to my office for continued preventative foot care services. Complaint: Patient states" my nails have grown long and thick and become painful to walk and wear shoes" Patient has been diagnosed with DM with neuropathy. The patient presents for preventative foot care services. No changes to ROS.  Patient requests diabetic shoes today.  Podiatric Exam: Vascular: dorsalis pedis and posterior tibial pulses are palpable bilateral. Capillary return is immediate. Temperature gradient is WNL. Skin turgor WNL  Sensorium: Diminished  Semmes Weinstein monofilament test left foot.  Absent  LOPS right foot. Normal tactile sensation bilaterally. Nail Exam: Pt has thick disfigured discolored nails with subungual debris noted bilateral entire nail hallux through fifth toenails Ulcer Exam: There is no evidence of ulcer or pre-ulcerative changes or infection. Orthopedic Exam: Muscle tone and strength are WNL. No limitations in general ROM. No crepitus or effusions noted. Foot type and digits show no abnormalities. Midfoot DJD.  Hallux malleus  B/l.  Hammer toes  B/L. Skin: No Porokeratosis. No infection or ulcers  Diagnosis:  Onychomycosis, , Pain in right toe, pain in left toes  Diabetes with neuropathy.  Hallux malleus.  Hammer toes  B/l  Treatment & Plan Procedures and Treatment: Consent by patient was obtained for treatment procedures.   Debridement of mycotic and hypertrophic toenails, 1 through 5 bilateral and clearing of subungual debris. No ulceration, no infection noted. Patient does qualify forto diabetes weuropathy and hallux malleus and hammertoes.  Patient to make an appointment with Liliane Channel  to be measured for her shoes Return Visit-Office Procedure: Patient instructed to return to the office for a follow up visit 3 months for continued evaluation and treatment.    Gardiner Barefoot DPM

## 2018-07-06 ENCOUNTER — Ambulatory Visit: Payer: Medicare Other | Admitting: Orthotics

## 2018-07-06 DIAGNOSIS — E1142 Type 2 diabetes mellitus with diabetic polyneuropathy: Secondary | ICD-10-CM

## 2018-07-06 DIAGNOSIS — M2041 Other hammer toe(s) (acquired), right foot: Secondary | ICD-10-CM

## 2018-07-06 DIAGNOSIS — M2042 Other hammer toe(s) (acquired), left foot: Secondary | ICD-10-CM

## 2018-07-06 NOTE — Progress Notes (Signed)
Patient was measured for med necessity extra depth shoes (x2) w/ 3 pair (x6) custom f/o. Patient was measured w/ brannock to determine size and width.  Foam impression mold was achieved and deemed appropriate for fabrication of  cmfo.   See DPM chart notes for further documentation and dx codes for determination of medical necessity.  Appropriate forms will be sent to PCP to verify and sign off on medical necessity.  Patient chose [id number]

## 2018-08-10 ENCOUNTER — Ambulatory Visit: Payer: Medicare Other | Admitting: Orthotics

## 2018-08-10 DIAGNOSIS — M79674 Pain in right toe(s): Secondary | ICD-10-CM

## 2018-08-10 DIAGNOSIS — M2041 Other hammer toe(s) (acquired), right foot: Secondary | ICD-10-CM

## 2018-08-10 DIAGNOSIS — B351 Tinea unguium: Secondary | ICD-10-CM

## 2018-08-10 DIAGNOSIS — E1142 Type 2 diabetes mellitus with diabetic polyneuropathy: Secondary | ICD-10-CM

## 2018-08-10 DIAGNOSIS — M79675 Pain in left toe(s): Secondary | ICD-10-CM

## 2018-08-10 DIAGNOSIS — M2042 Other hammer toe(s) (acquired), left foot: Secondary | ICD-10-CM

## 2018-08-10 NOTE — Progress Notes (Signed)

## 2018-08-23 ENCOUNTER — Other Ambulatory Visit: Payer: Self-pay | Admitting: Sports Medicine

## 2018-08-23 DIAGNOSIS — G8929 Other chronic pain: Secondary | ICD-10-CM

## 2018-08-23 DIAGNOSIS — M25512 Pain in left shoulder: Principal | ICD-10-CM

## 2018-09-01 ENCOUNTER — Ambulatory Visit (INDEPENDENT_AMBULATORY_CARE_PROVIDER_SITE_OTHER): Payer: Medicare Other | Admitting: Podiatry

## 2018-09-01 ENCOUNTER — Encounter: Payer: Self-pay | Admitting: Podiatry

## 2018-09-01 DIAGNOSIS — M79675 Pain in left toe(s): Secondary | ICD-10-CM

## 2018-09-01 DIAGNOSIS — M79674 Pain in right toe(s): Secondary | ICD-10-CM

## 2018-09-01 DIAGNOSIS — B351 Tinea unguium: Secondary | ICD-10-CM | POA: Diagnosis not present

## 2018-09-01 DIAGNOSIS — M2041 Other hammer toe(s) (acquired), right foot: Secondary | ICD-10-CM | POA: Diagnosis not present

## 2018-09-01 DIAGNOSIS — E1142 Type 2 diabetes mellitus with diabetic polyneuropathy: Secondary | ICD-10-CM

## 2018-09-01 DIAGNOSIS — M2042 Other hammer toe(s) (acquired), left foot: Secondary | ICD-10-CM

## 2018-09-01 NOTE — Progress Notes (Signed)
Complaint:  Visit Type: Patient returns to my office for continued preventative foot care services. Complaint: Patient states" my nails have grown long and thick and become painful to walk and wear shoes" Patient has been diagnosed with DM with neuropathy. The patient presents for preventative foot care services. No changes to ROS.  Patient requests diabetic shoes today.  Podiatric Exam: Vascular: dorsalis pedis and posterior tibial pulses are palpable bilateral. Capillary return is immediate. Temperature gradient is WNL. Skin turgor WNL  Sensorium: Diminished  Semmes Weinstein monofilament test left foot.  Absent  LOPS right foot. Normal tactile sensation bilaterally. Nail Exam: Pt has thick disfigured discolored nails with subungual debris noted bilateral entire nail hallux through fifth toenails Ulcer Exam: There is no evidence of ulcer or pre-ulcerative changes or infection. Orthopedic Exam: Muscle tone and strength are WNL. No limitations in general ROM. No crepitus or effusions noted. Foot type and digits show no abnormalities. Midfoot DJD.  Hallux malleus  B/l.  Hammer toes  B/L. Skin: No Porokeratosis. No infection or ulcers  Diagnosis:  Onychomycosis, , Pain in right toe, pain in left toes  Diabetes with neuropathy.  Hallux malleus.  Hammer toes  B/l  Treatment & Plan Procedures and Treatment: Consent by patient was obtained for treatment procedures.   Debridement of mycotic and hypertrophic toenails, 1 through 5 bilateral and clearing of subungual debris. No ulceration, no infection noted. Patient does qualify forto diabetes weuropathy and hallux malleus and hammertoes.   Return Visit-Office Procedure: Patient instructed to return to the office for a follow up visit 3 months for continued evaluation and treatment.    Gardiner Barefoot DPM

## 2018-09-08 ENCOUNTER — Ambulatory Visit
Admission: RE | Admit: 2018-09-08 | Discharge: 2018-09-08 | Disposition: A | Discharge status: Home / Self Care | Payer: Medicare Other | Source: Ambulatory Visit | Attending: Sports Medicine | Admitting: Sports Medicine

## 2018-09-08 DIAGNOSIS — M25512 Pain in left shoulder: Secondary | ICD-10-CM

## 2018-09-08 DIAGNOSIS — M7542 Impingement syndrome of left shoulder: Secondary | ICD-10-CM | POA: Insufficient documentation

## 2018-09-08 DIAGNOSIS — S43432A Superior glenoid labrum lesion of left shoulder, initial encounter: Secondary | ICD-10-CM | POA: Diagnosis not present

## 2018-09-08 DIAGNOSIS — G8929 Other chronic pain: Secondary | ICD-10-CM

## 2018-09-08 DIAGNOSIS — M24112 Other articular cartilage disorders, left shoulder: Secondary | ICD-10-CM | POA: Diagnosis not present

## 2018-09-08 DIAGNOSIS — X58XXXA Exposure to other specified factors, initial encounter: Secondary | ICD-10-CM | POA: Insufficient documentation

## 2018-09-08 DIAGNOSIS — M7552 Bursitis of left shoulder: Secondary | ICD-10-CM | POA: Insufficient documentation

## 2018-09-16 ENCOUNTER — Ambulatory Visit (INDEPENDENT_AMBULATORY_CARE_PROVIDER_SITE_OTHER): Payer: Medicare Other | Admitting: Urology

## 2018-09-16 ENCOUNTER — Other Ambulatory Visit
Admission: RE | Admit: 2018-09-16 | Discharge: 2018-09-16 | Disposition: A | Discharge status: Home / Self Care | Payer: Medicare Other | Source: Ambulatory Visit | Attending: Urology | Admitting: Urology

## 2018-09-16 ENCOUNTER — Other Ambulatory Visit: Payer: Self-pay

## 2018-09-16 VITALS — BP 161/71 | HR 62 | Ht 71.0 in | Wt 224.0 lb

## 2018-09-16 DIAGNOSIS — N393 Stress incontinence (female) (male): Secondary | ICD-10-CM | POA: Diagnosis not present

## 2018-09-16 DIAGNOSIS — Z8551 Personal history of malignant neoplasm of bladder: Secondary | ICD-10-CM

## 2018-09-16 DIAGNOSIS — N3281 Overactive bladder: Secondary | ICD-10-CM

## 2018-09-16 DIAGNOSIS — Z8546 Personal history of malignant neoplasm of prostate: Secondary | ICD-10-CM

## 2018-09-16 LAB — URINALYSIS, COMPLETE (UACMP) WITH MICROSCOPIC
BILIRUBIN URINE: NEGATIVE
Bacteria, UA: NONE SEEN
Glucose, UA: NEGATIVE mg/dL
Ketones, ur: NEGATIVE mg/dL
Leukocytes, UA: NEGATIVE
NITRITE: NEGATIVE
PH: 5.5 (ref 5.0–8.0)
Protein, ur: 100 mg/dL — AB
SPECIFIC GRAVITY, URINE: 1.02 (ref 1.005–1.030)
Squamous Epithelial / LPF: NONE SEEN (ref 0–5)

## 2018-09-16 NOTE — Progress Notes (Signed)
   09/16/18  CC:  Chief Complaint  Patient presents with  . Cysto    history bladder cancer     Cystoscopy Procedure Note  Patient identification was confirmed, informed consent was obtained, and patient was prepped using Betadine solution.  Lidocaine jelly was administered per urethral meatus.    Procedure: The flexible cystoscope was introduced without difficulty - No urethral strictures/lesions are present.  There is a tandem cuff sphincter in his bulbar urethra. Upon cycling the cuff, the urethra did open more patently to allow the scope to pass. No urethral erosions appreciated. - Surgically absent prostate  - Normal bladder neck - Bilateral ureteral orifices identified - Bladder mucosa  reveals no ulcers, tumors, or lesions - No bladder stones - No trabeculation  Retroflexion unremarkable   Post-Procedure: - Patient tolerated the procedure well   Post-Procedure: - Patient tolerated the procedure well   Hollice Espy, MD

## 2018-09-16 NOTE — Progress Notes (Signed)
09/16/2018 1:26 PM   Elijah Oliver August 21, 1941 [id number]  Referring provider: Dion Body, MD [facility] [place], [place] [address]  Chief Complaint  Patient presents with  . Cysto    history bladder cancer     HPI: 77 year old male with multiple GU issues who returns for routine annual follow-up with cysto.    History of prostate cancer Personal history of prostate cancer Prostatectomy 2004, open radical.  No recurrence.  PSA 07/06/17 in 0.01.  PSA ordered and pending  SUI AUS placed 2008, revised 08 to double cuff.   Works OK, wears up to 5-6 ppd but changes frequently for hygiene reasons More active he is the more he leaks  Reports he was never taught how to cycle the cuff and voids through the cuff without difficulty, leaks.  Unable to compress scrotal pump due to dexterity and arthritis.   Urinary urgency Improved on myrbetriq 50 mg  Tried interval off of medication which was worse Oxybutynin not as good as mybetriq 50 mg Unchanged this visit  History of kidney stones ESWL x 3 (2013, 2013, 2015) No recent flank pain.  No stones on CT from 11/2016 No interval imaging No flank pain this year  History of bladder cancer TgTa TCC 01/2012 incidental at the time of stone surgery No recurrence, last cysto 05/2018, annual surviellence  CKD Baseline Cr 1.6 as of 05/30/2018  History of urosepsis Recent hospital admission 11/2016 from E. Coli  CT scan abd/ pelvis w/o contrast 11/2016 describes "fuzziness" in left UJP  --> reviewed scan personally today, very subtile in setting of active infection  Erectile dysfuction Baseline severe ED   PMH: Past Medical History:  Diagnosis Date  . Backache 07/23/2017  . Benign essential hypertension 07/23/2017  . Bladder cancer ([place]) 07/23/2017  . Cellulitis of great toe of left foot 07/23/2017  . CKD (chronic kidney disease) stage 3, GFR 30-59 ml/min ([place]) 07/06/2017  . Coronary  arteriosclerosis 07/23/2017   Overview:  Sequential left internal mammary to the mid LAD and distal LAD, SVG to the second obtuse marginal and SVG to the right coronary artery. June 24th, 2006 Dr. Jerrye Noble  . History of prostate cancer 07/02/2017  . Ingrowing nail with infection 07/23/2017  . 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 Neck sprain 07/23/2017  . Neoplasm of uncertain behavior of skin 07/23/2017  . Nephrolithiasis 07/23/2017  . Pure hypercholesterolemia 07/02/2017  . Seizure disorder ([place]) 07/02/2017  . Shoulder strain 07/23/2017  . Sleep apnea 07/23/2017  . SOB (shortness of breath) 07/23/2017  . Thoracic sprain 07/23/2017  . Type 2 diabetes mellitus with diabetic neuropathy, without long-term current use of insulin ([place]) 07/02/2017  . Urge incontinence of urine 07/23/2017    Surgical History: Past Surgical History:  Procedure Laterality Date  . CARDIAC SURGERY  2006  . PROSTATECTOMY  2004  . REPLACEMENT TOTAL KNEE  2016  . TRANSURETHRAL RESECTION OF BLADDER TUMOR WITH GYRUS (TURBT-GYRUS)  2010?  Marland Kitchen URETEROSCOPY WITH HOLMIUM LASER LITHOTRIPSY  2010?    Home Medications:  Allergies as of 09/16/2018      Reactions   Other Anaphylaxis   Oriental food- anaphylaxis      Medication List        Accurate as of 09/16/18 11:59 PM. Always use your most recent med list.          aspirin 81 MG chewable tablet Chew by mouth.   atorvastatin 40 MG tablet Commonly known as:  LIPITOR  Take 40 mg by mouth daily.   carvedilol 3.125 MG tablet Commonly known as:  COREG TAKE 1 TABLET (3.125 MG TOTAL) BY MOUTH 2 (TWO) TIMES DAILY.   CENTRUM SILVER tablet Take by mouth.   glimepiride 1 MG tablet Commonly known as:  AMARYL   levETIRAcetam 500 MG tablet Commonly known as:  KEPPRA Take 500 mg by mouth 4 (four) times daily.   losartan 25 MG tablet Commonly known as:  COZAAR Take 25 mg by mouth daily.   mirabegron ER 50 MG Tb24 tablet Commonly known as:  MYRBETRIQ Take 1 tablet  (50 mg total) by mouth daily.   omeprazole 20 MG capsule Commonly known as:  PRILOSEC Take by mouth.   VITAMIN D-1000 MAX ST 1000 units tablet Generic drug:  Cholecalciferol Take by mouth.       Allergies:  Allergies  Allergen Reactions  . Other Anaphylaxis    Oriental food- anaphylaxis    Family History: Family History  Problem Relation Age of Onset  . Tuberculosis Father   . Prostate cancer Brother   . Kidney cancer Neg Hx   . Bladder Cancer Neg Hx     Social History:  reports that he has never smoked. He has never used smokeless tobacco. He reports that he does not drink alcohol or use drugs.  ROS: 12 point ROS negative other than as per HPI  UROLOGY Frequent Urination?: No Hard to postpone urination?: No Burning/pain with urination?: No Get up at night to urinate?: No Leakage of urine?: Yes Urine stream starts and stops?: No Trouble starting stream?: No Do you have to strain to urinate?: No Blood in urine?: No Urinary tract infection?: No Sexually transmitted disease?: No Injury to kidneys or bladder?: No Painful intercourse?: No Weak stream?: No Erection problems?: No Penile pain?: No    Physical Exam: BP (!) 161/71   Pulse 62   Ht 5\' 11"  (1.803 m)   Wt 224 lb (101.6 kg)   BMI 31.24 kg/m   Constitutional:  Alert and oriented, No acute distress. HEENT: [place] AT, moist mucus membranes.  Trachea midline, no masses. Cardiovascular: No clubbing, cyanosis, or edema. Respiratory: Normal respiratory effort, no increased work of breathing. GI: Abdomen is soft, nontender, nondistended, no abdominal masses GU: No CVA tenderness, normal phallus Skin: No rashes, bruises or suspicious lesions. Neurologic: Grossly intact, no focal deficits, moving all 4 extremities. Psychiatric: Normal mood and affect.  Laboratory Data: Labs as above  Urinalysis Results for orders placed or performed during the hospital encounter of 09/16/18  PSA  Result Value Ref Range     Prostatic Specific Antigen 0.02 0.00 - 4.00 ng/mL  Urinalysis, Complete w Microscopic  Result Value Ref Range   Color, Urine YELLOW YELLOW   APPearance CLEAR CLEAR   Specific Gravity, Urine 1.020 1.005 - 1.030   pH 5.5 5.0 - 8.0   Glucose, UA NEGATIVE NEGATIVE mg/dL   Hgb urine dipstick SMALL (A) NEGATIVE   Bilirubin Urine NEGATIVE NEGATIVE   Ketones, ur NEGATIVE NEGATIVE mg/dL   Protein, ur 100 (A) NEGATIVE mg/dL   Nitrite NEGATIVE NEGATIVE   Leukocytes, UA NEGATIVE NEGATIVE   Squamous Epithelial / LPF NONE SEEN 0 - 5   WBC, UA 0-5 0 - 5 WBC/hpf   RBC / HPF 0-5 0 - 5 RBC/hpf   Bacteria, UA NONE SEEN NONE SEEN    Pertinent Imaging: No new interval imagin  Assessment & Plan:    1. History of bladder cancer Cystoscopy today NED  Continue annual surveillance cystoscopy  2. History of prostate cancer PSA pending today Recommend continued annual PSA checks  3. OAB (overactive bladder) Currently on Myrbetriq 50 mg, has enough to get him through November then concerned about cost May consider switching back to oxybutynin although this medication is inferior for cost reasons He will let us know  4. Stress incontinence, male Tandem cuff in place, able to leak/void through this without cycling cough Previous postvoid residuals minimal Creatinine is stable   Return in about 1 year (around 09/17/2019) for cysto.  Elijah Espy, MD  [facility] [address] [place], [place] [address] [phone]

## 2018-09-17 LAB — PSA: Prostatic Specific Antigen: 0.02 ng/mL (ref 0.00–4.00)

## 2018-09-19 ENCOUNTER — Telehealth: Payer: Self-pay | Admitting: Family Medicine

## 2018-09-19 NOTE — Telephone Encounter (Signed)
Patient notified

## 2018-09-19 NOTE — Telephone Encounter (Signed)
-----   Message from Hollice Espy, MD sent at 09/17/2018  1:33 PM EDT ----- PSA  Low/ stable  Hollice Espy, MD

## 2018-12-01 ENCOUNTER — Ambulatory Visit (INDEPENDENT_AMBULATORY_CARE_PROVIDER_SITE_OTHER): Payer: Medicare Other | Admitting: Podiatry

## 2018-12-01 ENCOUNTER — Encounter: Payer: Self-pay | Admitting: Podiatry

## 2018-12-01 DIAGNOSIS — M79674 Pain in right toe(s): Secondary | ICD-10-CM

## 2018-12-01 DIAGNOSIS — B351 Tinea unguium: Secondary | ICD-10-CM

## 2018-12-01 DIAGNOSIS — M79675 Pain in left toe(s): Secondary | ICD-10-CM

## 2018-12-01 DIAGNOSIS — R471 Dysarthria and anarthria: Secondary | ICD-10-CM | POA: Insufficient documentation

## 2018-12-01 DIAGNOSIS — E7849 Other hyperlipidemia: Secondary | ICD-10-CM | POA: Insufficient documentation

## 2018-12-01 DIAGNOSIS — C61 Malignant neoplasm of prostate: Secondary | ICD-10-CM | POA: Insufficient documentation

## 2018-12-01 DIAGNOSIS — M2042 Other hammer toe(s) (acquired), left foot: Secondary | ICD-10-CM

## 2018-12-01 DIAGNOSIS — I10 Essential (primary) hypertension: Secondary | ICD-10-CM | POA: Insufficient documentation

## 2018-12-01 DIAGNOSIS — L0390 Cellulitis, unspecified: Secondary | ICD-10-CM | POA: Insufficient documentation

## 2018-12-01 DIAGNOSIS — E1142 Type 2 diabetes mellitus with diabetic polyneuropathy: Secondary | ICD-10-CM

## 2018-12-01 DIAGNOSIS — M2041 Other hammer toe(s) (acquired), right foot: Secondary | ICD-10-CM

## 2018-12-01 NOTE — Progress Notes (Signed)
Complaint:  Visit Type: Patient returns to my office for continued preventative foot care services. Complaint: Patient states" my nails have grown long and thick and become painful to walk and wear shoes" Patient has been diagnosed with DM with neuropathy. The patient presents for preventative foot care services. No changes to ROS.    Podiatric Exam: Vascular: dorsalis pedis and posterior tibial pulses are palpable bilateral. Capillary return is immediate. Temperature gradient is WNL. Skin turgor WNL  Sensorium: Diminished  Semmes Weinstein monofilament test left foot.  Absent  LOPS right foot. Normal tactile sensation bilaterally. Nail Exam: Pt has thick disfigured discolored nails with subungual debris noted bilateral entire nail hallux through fifth toenails Ulcer Exam: There is no evidence of ulcer or pre-ulcerative changes or infection. Orthopedic Exam: Muscle tone and strength are WNL. No limitations in general ROM. No crepitus or effusions noted. Foot type and digits show no abnormalities. Midfoot DJD.  Hallux malleus  B/l.  Hammer toes  B/L. Skin: No Porokeratosis. No infection or ulcers  Diagnosis:  Onychomycosis, , Pain in right toe, pain in left toes  Diabetes with neuropathy.  Hallux malleus.  Hammer toes  B/l  Treatment & Plan Procedures and Treatment: Consent by patient was obtained for treatment procedures.   Debridement of mycotic and hypertrophic toenails, 1 through 5 bilateral and clearing of subungual debris. No ulceration, no infection .  Return Visit-Office Procedure: Patient instructed to return to the office for a follow up visit 3 months for continued evaluation and treatment.    Deysha Cartier DPM 

## 2019-03-02 ENCOUNTER — Ambulatory Visit: Payer: Medicare Other | Admitting: Podiatry

## 2019-04-06 ENCOUNTER — Other Ambulatory Visit: Payer: Self-pay

## 2019-04-06 ENCOUNTER — Encounter: Payer: Self-pay | Admitting: Podiatry

## 2019-04-06 ENCOUNTER — Ambulatory Visit (INDEPENDENT_AMBULATORY_CARE_PROVIDER_SITE_OTHER): Payer: Medicare Other | Admitting: Podiatry

## 2019-04-06 VITALS — Temp 98.3°F

## 2019-04-06 DIAGNOSIS — E1142 Type 2 diabetes mellitus with diabetic polyneuropathy: Secondary | ICD-10-CM

## 2019-04-06 DIAGNOSIS — B351 Tinea unguium: Secondary | ICD-10-CM | POA: Diagnosis not present

## 2019-04-06 DIAGNOSIS — M79675 Pain in left toe(s): Secondary | ICD-10-CM | POA: Diagnosis not present

## 2019-04-06 DIAGNOSIS — M79674 Pain in right toe(s): Secondary | ICD-10-CM | POA: Diagnosis not present

## 2019-04-06 NOTE — Progress Notes (Signed)
Complaint:  Visit Type: Patient returns to my office for continued preventative foot care services. Complaint: Patient states" my nails have grown long and thick and become painful to walk and wear shoes" Patient has been diagnosed with DM with neuropathy. The patient presents for preventative foot care services. No changes to ROS.    Podiatric Exam: Vascular: dorsalis pedis and posterior tibial pulses are palpable bilateral. Capillary return is immediate. Temperature gradient is WNL. Skin turgor WNL  Sensorium: Diminished  Semmes Weinstein monofilament test left foot.  Absent  LOPS right foot. Normal tactile sensation bilaterally. Nail Exam: Pt has thick disfigured discolored nails with subungual debris noted bilateral entire nail hallux through fifth toenails Ulcer Exam: There is no evidence of ulcer or pre-ulcerative changes or infection. Orthopedic Exam: Muscle tone and strength are WNL. No limitations in general ROM. No crepitus or effusions noted. Foot type and digits show no abnormalities. Midfoot DJD.  Hallux malleus  B/l.  Hammer toes  B/L. Skin: No Porokeratosis. No infection or ulcers  Diagnosis:  Onychomycosis, , Pain in right toe, pain in left toes  Diabetes with neuropathy.  Hallux malleus.  Hammer toes  B/l  Treatment & Plan Procedures and Treatment: Consent by patient was obtained for treatment procedures.   Debridement of mycotic and hypertrophic toenails, 1 through 5 bilateral and clearing of subungual debris. No ulceration, no infection .  Return Visit-Office Procedure: Patient instructed to return to the office for a follow up visit 3 months for continued evaluation and treatment.    Gardiner Barefoot DPM

## 2019-07-06 ENCOUNTER — Encounter: Payer: Self-pay | Admitting: Podiatry

## 2019-07-06 ENCOUNTER — Ambulatory Visit (INDEPENDENT_AMBULATORY_CARE_PROVIDER_SITE_OTHER): Payer: Medicare Other | Admitting: Podiatry

## 2019-07-06 ENCOUNTER — Other Ambulatory Visit: Payer: Self-pay

## 2019-07-06 VITALS — Temp 97.3°F

## 2019-07-06 DIAGNOSIS — M79674 Pain in right toe(s): Secondary | ICD-10-CM

## 2019-07-06 DIAGNOSIS — B351 Tinea unguium: Secondary | ICD-10-CM | POA: Diagnosis not present

## 2019-07-06 DIAGNOSIS — E1142 Type 2 diabetes mellitus with diabetic polyneuropathy: Secondary | ICD-10-CM

## 2019-07-06 DIAGNOSIS — M79675 Pain in left toe(s): Secondary | ICD-10-CM | POA: Diagnosis not present

## 2019-07-06 DIAGNOSIS — M2041 Other hammer toe(s) (acquired), right foot: Secondary | ICD-10-CM

## 2019-07-06 DIAGNOSIS — M2030 Hallux varus (acquired), unspecified foot: Secondary | ICD-10-CM

## 2019-07-06 DIAGNOSIS — M2042 Other hammer toe(s) (acquired), left foot: Secondary | ICD-10-CM

## 2019-07-06 NOTE — Progress Notes (Signed)
Complaint:  Visit Type: Patient returns to my office for continued preventative foot care services. Complaint: Patient states" my nails have grown long and thick and become painful to walk and wear shoes" Patient has been diagnosed with DM with neuropathy. The patient presents for preventative foot care services. No changes to ROS.    Podiatric Exam: Vascular: dorsalis pedis and posterior tibial pulses are palpable bilateral. Capillary return is immediate. Temperature gradient is WNL. Skin turgor WNL  Sensorium: Diminished  Semmes Weinstein monofilament test left foot.  Absent  LOPS right foot. Normal tactile sensation bilaterally. Nail Exam: Pt has thick disfigured discolored nails with subungual debris noted bilateral entire nail hallux through fifth toenails Ulcer Exam: There is no evidence of ulcer or pre-ulcerative changes or infection. Orthopedic Exam: Muscle tone and strength are WNL. No limitations in general ROM. No crepitus or effusions noted. Foot type and digits show no abnormalities. Midfoot DJD.  Hallux malleus  B/l.  Hammer toes  B/L. Skin: No Porokeratosis. No infection or ulcers  Diagnosis:  Onychomycosis, , Pain in right toe, pain in left toes  Diabetes with neuropathy.  Hallux malleus.  Hammer toes  B/l  Treatment & Plan Procedures and Treatment: Consent by patient was obtained for treatment procedures.   Debridement of mycotic and hypertrophic toenails, 1 through 5 bilateral and clearing of subungual debris. No ulceration, no infection . Patient desires new diabetic shoes for DPN and hallux malleus and hammer toes  B/L. Return Visit-Office Procedure: Patient instructed to return to the office for a follow up visit 3 months for continued evaluation and treatment.    Gardiner Barefoot DPM

## 2019-07-26 ENCOUNTER — Other Ambulatory Visit: Payer: Self-pay

## 2019-07-26 ENCOUNTER — Ambulatory Visit: Payer: Medicare Other | Admitting: Orthotics

## 2019-07-26 DIAGNOSIS — M2041 Other hammer toe(s) (acquired), right foot: Secondary | ICD-10-CM

## 2019-07-26 DIAGNOSIS — E1142 Type 2 diabetes mellitus with diabetic polyneuropathy: Secondary | ICD-10-CM

## 2019-07-26 DIAGNOSIS — M2042 Other hammer toe(s) (acquired), left foot: Secondary | ICD-10-CM

## 2019-07-26 DIAGNOSIS — M2030 Hallux varus (acquired), unspecified foot: Secondary | ICD-10-CM

## 2019-07-26 NOTE — Progress Notes (Signed)

## 2019-08-30 ENCOUNTER — Other Ambulatory Visit: Payer: Self-pay

## 2019-08-30 ENCOUNTER — Ambulatory Visit (INDEPENDENT_AMBULATORY_CARE_PROVIDER_SITE_OTHER): Payer: Medicare Other | Admitting: Orthotics

## 2019-08-30 DIAGNOSIS — E1140 Type 2 diabetes mellitus with diabetic neuropathy, unspecified: Secondary | ICD-10-CM | POA: Diagnosis not present

## 2019-08-30 DIAGNOSIS — M2042 Other hammer toe(s) (acquired), left foot: Secondary | ICD-10-CM | POA: Diagnosis not present

## 2019-08-30 DIAGNOSIS — M2041 Other hammer toe(s) (acquired), right foot: Secondary | ICD-10-CM

## 2019-08-30 DIAGNOSIS — M216X1 Other acquired deformities of right foot: Secondary | ICD-10-CM | POA: Diagnosis not present

## 2019-08-30 DIAGNOSIS — E1142 Type 2 diabetes mellitus with diabetic polyneuropathy: Secondary | ICD-10-CM

## 2019-08-30 DIAGNOSIS — M2030 Hallux varus (acquired), unspecified foot: Secondary | ICD-10-CM

## 2019-08-31 NOTE — Progress Notes (Signed)

## 2019-09-06 ENCOUNTER — Other Ambulatory Visit: Payer: Medicare Other | Admitting: Orthotics

## 2019-10-05 ENCOUNTER — Encounter: Payer: Self-pay | Admitting: Podiatry

## 2019-10-05 ENCOUNTER — Other Ambulatory Visit: Payer: Self-pay

## 2019-10-05 ENCOUNTER — Ambulatory Visit (INDEPENDENT_AMBULATORY_CARE_PROVIDER_SITE_OTHER): Payer: Medicare Other | Admitting: Podiatry

## 2019-10-05 DIAGNOSIS — B351 Tinea unguium: Secondary | ICD-10-CM | POA: Diagnosis not present

## 2019-10-05 DIAGNOSIS — M79674 Pain in right toe(s): Secondary | ICD-10-CM

## 2019-10-05 DIAGNOSIS — M79675 Pain in left toe(s): Secondary | ICD-10-CM | POA: Diagnosis not present

## 2019-10-05 DIAGNOSIS — E1142 Type 2 diabetes mellitus with diabetic polyneuropathy: Secondary | ICD-10-CM | POA: Diagnosis not present

## 2019-10-05 NOTE — Progress Notes (Signed)
Complaint:  Visit Type: Patient returns to my office for continued preventative foot care services. Complaint: Patient states" my nails have grown long and thick and become painful to walk and wear shoes" Patient has been diagnosed with DM with neuropathy. The patient presents for preventative foot care services. No changes to ROS.    Podiatric Exam: Vascular: dorsalis pedis and posterior tibial pulses are palpable bilateral. Capillary return is immediate. Temperature gradient is WNL. Skin turgor WNL  Sensorium: Diminished  Semmes Weinstein monofilament test left foot.  Absent  LOPS right foot. Normal tactile sensation bilaterally. Nail Exam: Pt has thick disfigured discolored nails with subungual debris noted bilateral entire nail hallux through fifth toenails Ulcer Exam: There is no evidence of ulcer or pre-ulcerative changes or infection. Orthopedic Exam: Muscle tone and strength are WNL. No limitations in general ROM. No crepitus or effusions noted. Foot type and digits show no abnormalities. Midfoot DJD.  Hallux malleus  B/l.  Hammer toes  B/L. Skin: No Porokeratosis. No infection or ulcers  Diagnosis:  Onychomycosis, , Pain in right toe, pain in left toes  Diabetes with neuropathy.  Hallux malleus.  Hammer toes  B/l  Treatment & Plan Procedures and Treatment: Consent by patient was obtained for treatment procedures.   Debridement of mycotic and hypertrophic toenails, 1 through 5 bilateral and clearing of subungual debris. No ulceration, no infection .  Return Visit-Office Procedure: Patient instructed to return to the office for a follow up visit 3 months for continued evaluation and treatment.    Ozil Stettler DPM 

## 2020-01-04 ENCOUNTER — Encounter: Payer: Self-pay | Admitting: Podiatry

## 2020-01-04 ENCOUNTER — Ambulatory Visit (INDEPENDENT_AMBULATORY_CARE_PROVIDER_SITE_OTHER): Payer: Medicare Other | Admitting: Podiatry

## 2020-01-04 ENCOUNTER — Other Ambulatory Visit: Payer: Self-pay

## 2020-01-04 DIAGNOSIS — M2041 Other hammer toe(s) (acquired), right foot: Secondary | ICD-10-CM

## 2020-01-04 DIAGNOSIS — E1142 Type 2 diabetes mellitus with diabetic polyneuropathy: Secondary | ICD-10-CM

## 2020-01-04 DIAGNOSIS — M2030 Hallux varus (acquired), unspecified foot: Secondary | ICD-10-CM

## 2020-01-04 DIAGNOSIS — M2042 Other hammer toe(s) (acquired), left foot: Secondary | ICD-10-CM

## 2020-01-04 DIAGNOSIS — M79674 Pain in right toe(s): Secondary | ICD-10-CM

## 2020-01-04 DIAGNOSIS — B351 Tinea unguium: Secondary | ICD-10-CM | POA: Diagnosis not present

## 2020-01-04 DIAGNOSIS — M79675 Pain in left toe(s): Secondary | ICD-10-CM

## 2020-01-04 NOTE — Progress Notes (Signed)
Complaint:  Visit Type: Patient returns to my office for continued preventative foot care services. Complaint: Patient states" my nails have grown long and thick and become painful to walk and wear shoes" Patient has been diagnosed with DM with neuropathy. The patient presents for preventative foot care services. No changes to ROS.    Podiatric Exam: Vascular: dorsalis pedis and posterior tibial pulses are palpable bilateral. Capillary return is immediate. Temperature gradient is WNL. Skin turgor WNL  Sensorium: Diminished  Semmes Weinstein monofilament test left foot.  Absent  LOPS right foot. Normal tactile sensation bilaterally. Nail Exam: Pt has thick disfigured discolored nails with subungual debris noted bilateral entire nail hallux through fifth toenails Ulcer Exam: There is no evidence of ulcer or pre-ulcerative changes or infection. Orthopedic Exam: Muscle tone and strength are WNL. No limitations in general ROM. No crepitus or effusions noted. Foot type and digits show no abnormalities. Midfoot DJD.  Hallux malleus  B/l.  Hammer toes  B/L. Skin: No Porokeratosis. No infection or ulcers  Diagnosis:  Onychomycosis, , Pain in right toe, pain in left toes  Diabetes with neuropathy.  Hallux malleus.  Hammer toes  B/l  Treatment & Plan Procedures and Treatment: Consent by patient was obtained for treatment procedures.   Debridement of mycotic and hypertrophic toenails, 1 through 5 bilateral and clearing of subungual debris. No ulceration, no infection .  Return Visit-Office Procedure: Patient instructed to return to the office for a follow up visit 3 months for continued evaluation and treatment.    Gardiner Barefoot DPM

## 2020-04-04 ENCOUNTER — Encounter: Payer: Self-pay | Admitting: Podiatry

## 2020-04-04 ENCOUNTER — Other Ambulatory Visit: Payer: Self-pay

## 2020-04-04 ENCOUNTER — Ambulatory Visit (INDEPENDENT_AMBULATORY_CARE_PROVIDER_SITE_OTHER): Payer: Medicare Other | Admitting: Podiatry

## 2020-04-04 VITALS — Temp 97.2°F

## 2020-04-04 DIAGNOSIS — M79674 Pain in right toe(s): Secondary | ICD-10-CM

## 2020-04-04 DIAGNOSIS — B351 Tinea unguium: Secondary | ICD-10-CM

## 2020-04-04 DIAGNOSIS — M2041 Other hammer toe(s) (acquired), right foot: Secondary | ICD-10-CM

## 2020-04-04 DIAGNOSIS — M2042 Other hammer toe(s) (acquired), left foot: Secondary | ICD-10-CM

## 2020-04-04 DIAGNOSIS — M2030 Hallux varus (acquired), unspecified foot: Secondary | ICD-10-CM

## 2020-04-04 DIAGNOSIS — M79675 Pain in left toe(s): Secondary | ICD-10-CM

## 2020-04-04 DIAGNOSIS — E1142 Type 2 diabetes mellitus with diabetic polyneuropathy: Secondary | ICD-10-CM

## 2020-04-04 NOTE — Progress Notes (Signed)
This patient returns to my office for at risk foot care.  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  This patient is unable to cut nails himself since the patient cannot reach his nails.These nails are painful walking and wearing shoes.  This patient presents for at risk foot care today. ? ?General Appearance  Alert, conversant and in no acute stress. ? ?Vascular  Dorsalis pedis and posterior tibial  pulses are palpable  bilaterally.  Capillary return is within normal limits  bilaterally. Temperature is within normal limits  bilaterally. ? ?Neurologic  Senn-Weinstein monofilament wire test diminished  bilaterally. Muscle power within normal limits bilaterally. ? ?Nails Thick disfigured discolored nails with subungual debris  from hallux to fifth toes bilaterally. No evidence of bacterial infection or drainage bilaterally. ? ?Orthopedic  No limitations of motion  feet .  No crepitus or effusions noted.  No bony pathology or digital deformities noted.  Hallux malleus  Hammer toes  B/L. ? ?Skin  normotropic skin with no porokeratosis noted bilaterally.  No signs of infections or ulcers noted.    ? ?Onychomycosis  Pain in right toes  Pain in left toes ? ?Consent was obtained for treatment procedures.   Mechanical debridement of nails 1-5  bilaterally performed with a nail nipper.  Filed with dremel without incident.   ? ?Return office visit   3 months                   Told patient to return for periodic foot care and evaluation due to potential at risk complications. ? ? ?Vaun Hyndman DPM  ?

## 2020-05-09 ENCOUNTER — Other Ambulatory Visit: Payer: Self-pay | Admitting: *Deleted

## 2020-05-09 DIAGNOSIS — Z8551 Personal history of malignant neoplasm of bladder: Secondary | ICD-10-CM

## 2020-05-09 NOTE — Progress Notes (Signed)
   05/10/20   CC:  Chief Complaint  Patient presents with  . Cysto    HPI: Elijah Oliver is a 79 y.o. M with multiple GU issues who returns for routine annual follow-up for overdue cysto.  History of prostate cancer Personal history of prostate cancer Prostatectomy 2004, open radical. No recurrence. PSA 0.01 in 5/21.  SUI AUS placed 2008, revised 08 to double cuff.  Cuff currently permanently deactivated given his inability to use his hands and cycle cuff More active he is the more he leaks  Urinary urgency Stable onmyrbetriq 50 mg  Tried interval off of medication which was worse Oxybutynin not as good as mybetriq 50 mg  History of kidney stones ESWL x 3 (2013, 2013, 2015) No recent flank pain.  No stones on CT from 11/2016 No interval imaging No flank pain this year  History of bladder cancer TgTa TCC 01/2012 incidental at the time of stone surgery No recurrence, last cysto7/2019, annual surviellence  CKD Baseline Cr 1.5   History of urosepsis Recent hospital admission 11/2016 from [facility]  CT scan abd/ pelvis w/o contrast 11/2016 describes "fuzziness" in left UJP--> reviewed scanpersonally today, very subtile insetting of active infection  Erectile dysfuction Baseline severe ED  Vitals:   05/10/20 1331  BP: (!) 159/72  Pulse: 64   NED. A&Ox3.   No respiratory distress   Abd soft, NT, ND Normal phallus with bilateral descended testicles  Cystoscopy Procedure Note  Patient identification was confirmed, informed consent was obtained, and patient was prepped using Betadine solution.  Lidocaine jelly was administered per urethral meatus.     Pre-Procedure: - Inspection reveals a normal caliber ureteral meatus.  Cuff was checked prior to introducing the scope and appeared to be permanently deactivated in position.  Procedure: The flexible cystoscope was introduced without difficulty - No urethral strictures/lesions are present.There is a  tandem cuff sphincter in his bulbar urethra. . No urethral erosions appreciated. -Surgically absentprostate  -Normalbladder neck - Bilateral ureteral orifices identified - Bladder mucosa reveals no ulcers, tumors, or lesions - No bladder stones - No trabeculation  Retroflexion unremarkable.   Post-Procedure: - Patient tolerated the procedure well  Assessment/ Plan:  1. History of bladder cancer Cystoscopy today NED Continue annual surveillance cystoscopy  2. History of prostate cancer PSA up to date  PSA 0.01 in 5/21  3. OAB (overactive bladder) Currently on Myrbetriq 50 mg  4. Microscopic hematuria Incidental today  Given his hx of bladder cancer and questionable UPJ area in the past, will obtain CTU Will call w/ CTU results   F/u annual cysto  I, Nethusan Sivanesan, am acting as a scribe for Dr. Hollice Espy,  I have reviewed the above documentation for accuracy and completeness, and I agree with the above.   Hollice Espy, MD

## 2020-05-10 ENCOUNTER — Ambulatory Visit (INDEPENDENT_AMBULATORY_CARE_PROVIDER_SITE_OTHER): Payer: Medicare Other | Admitting: Urology

## 2020-05-10 ENCOUNTER — Other Ambulatory Visit: Payer: Self-pay

## 2020-05-10 ENCOUNTER — Other Ambulatory Visit
Admission: RE | Admit: 2020-05-10 | Discharge: 2020-05-10 | Disposition: A | Discharge status: Home / Self Care | Payer: Medicare Other | Attending: Urology | Admitting: Urology

## 2020-05-10 ENCOUNTER — Other Ambulatory Visit: Payer: Medicare Other | Admitting: Urology

## 2020-05-10 ENCOUNTER — Encounter: Payer: Self-pay | Admitting: Urology

## 2020-05-10 VITALS — BP 159/72 | HR 64 | Ht 71.0 in | Wt 220.0 lb

## 2020-05-10 DIAGNOSIS — Z8551 Personal history of malignant neoplasm of bladder: Secondary | ICD-10-CM

## 2020-05-10 DIAGNOSIS — R3129 Other microscopic hematuria: Secondary | ICD-10-CM

## 2020-05-10 LAB — URINALYSIS, COMPLETE (UACMP) WITH MICROSCOPIC
Bacteria, UA: NONE SEEN
Bilirubin Urine: NEGATIVE
Glucose, UA: NEGATIVE mg/dL
Ketones, ur: NEGATIVE mg/dL
Leukocytes,Ua: NEGATIVE
Nitrite: NEGATIVE
Protein, ur: 30 mg/dL — AB
Specific Gravity, Urine: 1.02 (ref 1.005–1.030)
Squamous Epithelial / HPF: NONE SEEN (ref 0–5)
WBC, UA: NONE SEEN WBC/hpf (ref 0–5)
pH: 5.5 (ref 5.0–8.0)

## 2020-05-10 MED ORDER — LIDOCAINE HCL URETHRAL/MUCOSAL 2 % EX GEL
1.0000 "application " | Freq: Once | CUTANEOUS | Status: AC
  Administered 2020-05-10: 1 via URETHRAL

## 2020-07-04 ENCOUNTER — Other Ambulatory Visit: Payer: Self-pay

## 2020-07-04 DIAGNOSIS — R3129 Other microscopic hematuria: Secondary | ICD-10-CM

## 2020-07-05 ENCOUNTER — Other Ambulatory Visit
Admission: RE | Admit: 2020-07-05 | Discharge: 2020-07-05 | Disposition: A | Discharge status: Home / Self Care | Payer: Medicare Other | Source: Ambulatory Visit | Attending: Urology | Admitting: Urology

## 2020-07-05 ENCOUNTER — Ambulatory Visit
Admission: RE | Admit: 2020-07-05 | Discharge: 2020-07-05 | Disposition: A | Discharge status: Home / Self Care | Payer: Medicare Other | Source: Ambulatory Visit | Attending: Urology | Admitting: Urology

## 2020-07-05 ENCOUNTER — Other Ambulatory Visit: Payer: Self-pay

## 2020-07-05 DIAGNOSIS — Z8551 Personal history of malignant neoplasm of bladder: Secondary | ICD-10-CM | POA: Diagnosis not present

## 2020-07-05 DIAGNOSIS — R3129 Other microscopic hematuria: Secondary | ICD-10-CM

## 2020-07-05 LAB — CREATININE, SERUM
Creatinine, Ser: 1.52 mg/dL — ABNORMAL HIGH (ref 0.61–1.24)
GFR calc Af Amer: 50 mL/min — ABNORMAL LOW (ref >60)
GFR calc non Af Amer: 44 mL/min — ABNORMAL LOW (ref >60)

## 2020-07-05 MED ORDER — IOHEXOL 300 MG/ML  SOLN
100.0000 mL | Freq: Once | INTRAMUSCULAR | Status: AC | PRN
  Administered 2020-07-05: 100 mL via INTRAVENOUS

## 2020-07-08 ENCOUNTER — Other Ambulatory Visit: Payer: Self-pay

## 2020-07-08 ENCOUNTER — Encounter: Payer: Self-pay | Admitting: Podiatry

## 2020-07-08 ENCOUNTER — Ambulatory Visit (INDEPENDENT_AMBULATORY_CARE_PROVIDER_SITE_OTHER): Payer: Medicare Other | Admitting: Podiatry

## 2020-07-08 DIAGNOSIS — M2041 Other hammer toe(s) (acquired), right foot: Secondary | ICD-10-CM | POA: Diagnosis not present

## 2020-07-08 DIAGNOSIS — M2042 Other hammer toe(s) (acquired), left foot: Secondary | ICD-10-CM

## 2020-07-08 DIAGNOSIS — B351 Tinea unguium: Secondary | ICD-10-CM

## 2020-07-08 DIAGNOSIS — M79675 Pain in left toe(s): Secondary | ICD-10-CM

## 2020-07-08 DIAGNOSIS — M2030 Hallux varus (acquired), unspecified foot: Secondary | ICD-10-CM

## 2020-07-08 DIAGNOSIS — E1142 Type 2 diabetes mellitus with diabetic polyneuropathy: Secondary | ICD-10-CM | POA: Diagnosis not present

## 2020-07-08 DIAGNOSIS — M79674 Pain in right toe(s): Secondary | ICD-10-CM

## 2020-07-08 NOTE — Progress Notes (Signed)
This patient returns to my office for at risk foot care.  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  This patient is unable to cut nails himself since the patient cannot reach his nails.These nails are painful walking and wearing shoes.  This patient presents for at risk foot care today.  General Appearance  Alert, conversant and in no acute stress.  Vascular  Dorsalis pedis and posterior tibial  pulses are palpable  bilaterally.  Capillary return is within normal limits  bilaterally. Temperature is within normal limits  bilaterally.  Neurologic  Senn-Weinstein monofilament wire test diminished  bilaterally. Muscle power within normal limits bilaterally.  Nails Thick disfigured discolored nails with subungual debris  from hallux to fifth toes bilaterally. No evidence of bacterial infection or drainage bilaterally.  Orthopedic  No limitations of motion  feet .  No crepitus or effusions noted.  No bony pathology or digital deformities noted.  Hallux malleus  Hammer toes  B/L.  Skin  normotropic skin with no porokeratosis noted bilaterally.  No signs of infections or ulcers noted.     Onychomycosis  Pain in right toes  Pain in left toes  Consent was obtained for treatment procedures.   Mechanical debridement of nails 1-5  bilaterally performed with a nail nipper.  Filed with dremel without incident. 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   Return office visit   3 months                   Told patient to return for periodic foot care and evaluation due to potential at risk complications.   Gardiner Barefoot DPM

## 2020-07-10 ENCOUNTER — Other Ambulatory Visit: Payer: Self-pay

## 2020-07-10 ENCOUNTER — Ambulatory Visit: Payer: Medicare Other | Admitting: Orthotics

## 2020-07-10 DIAGNOSIS — E1142 Type 2 diabetes mellitus with diabetic polyneuropathy: Secondary | ICD-10-CM

## 2020-07-10 DIAGNOSIS — M2030 Hallux varus (acquired), unspecified foot: Secondary | ICD-10-CM

## 2020-07-10 DIAGNOSIS — B351 Tinea unguium: Secondary | ICD-10-CM

## 2020-07-10 DIAGNOSIS — M79674 Pain in right toe(s): Secondary | ICD-10-CM

## 2020-07-10 NOTE — Progress Notes (Signed)

## 2020-07-19 ENCOUNTER — Telehealth: Payer: Self-pay | Admitting: *Deleted

## 2020-07-19 DIAGNOSIS — K869 Disease of pancreas, unspecified: Secondary | ICD-10-CM

## 2020-07-19 NOTE — Telephone Encounter (Addendum)
Left patient a VM to return call.  ----- Message from Hollice Espy, MD sent at 07/19/2020  9:38 AM EDT ----- There is a question about a possible lesion on the tail of the pancreas.  Because we don't have access to your previous imaging, it is difficult to tell whether this was there or not.  The radiologist has recommended pursuing an MRI to get a better idea of what this might be.  Please order abdomen MRI with and without contrast and if there is a lesion, we'll get him to the appropriate provider to manage this.    Hollice Espy, MD

## 2020-07-22 NOTE — Telephone Encounter (Signed)
Patient left vmail returning your call 

## 2020-07-22 NOTE — Telephone Encounter (Signed)
Patient informed, voiced understanding. MRI ordered.

## 2020-08-21 ENCOUNTER — Ambulatory Visit
Admission: RE | Admit: 2020-08-21 | Discharge: 2020-08-21 | Disposition: A | Discharge status: Home / Self Care | Payer: Medicare Other | Source: Ambulatory Visit | Attending: Urology | Admitting: Urology

## 2020-08-21 ENCOUNTER — Other Ambulatory Visit: Payer: Self-pay

## 2020-08-21 DIAGNOSIS — K869 Disease of pancreas, unspecified: Secondary | ICD-10-CM | POA: Insufficient documentation

## 2020-08-21 MED ORDER — GADOBUTROL 1 MMOL/ML IV SOLN
10.0000 mL | Freq: Once | INTRAVENOUS | Status: AC | PRN
  Administered 2020-08-21: 10 mL via INTRAVENOUS

## 2020-08-30 ENCOUNTER — Telehealth: Payer: Self-pay | Admitting: Urology

## 2020-08-30 DIAGNOSIS — R1900 Intra-abdominal and pelvic swelling, mass and lump, unspecified site: Secondary | ICD-10-CM

## 2020-08-30 DIAGNOSIS — K869 Disease of pancreas, unspecified: Secondary | ICD-10-CM

## 2020-08-30 NOTE — Telephone Encounter (Signed)
Called patient to discuss MRI findings.  Reviewed the radiologist, recommends repeat MRI in 3 months.  Patient is absolutely agreeable with this plan.  Order was placed.  Please help schedule.  Hollice Espy, MD

## 2020-10-14 ENCOUNTER — Other Ambulatory Visit: Payer: Self-pay

## 2020-10-14 ENCOUNTER — Encounter: Payer: Self-pay | Admitting: Podiatry

## 2020-10-14 ENCOUNTER — Ambulatory Visit (INDEPENDENT_AMBULATORY_CARE_PROVIDER_SITE_OTHER): Payer: Medicare Other | Admitting: Podiatry

## 2020-10-14 DIAGNOSIS — M79674 Pain in right toe(s): Secondary | ICD-10-CM

## 2020-10-14 DIAGNOSIS — B351 Tinea unguium: Secondary | ICD-10-CM

## 2020-10-14 DIAGNOSIS — M79675 Pain in left toe(s): Secondary | ICD-10-CM | POA: Diagnosis not present

## 2020-10-14 DIAGNOSIS — E1142 Type 2 diabetes mellitus with diabetic polyneuropathy: Secondary | ICD-10-CM | POA: Diagnosis not present

## 2020-10-14 NOTE — Progress Notes (Signed)
This patient returns to my office for at risk foot care.  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  This patient is unable to cut nails himself since the patient cannot reach his nails.These nails are painful walking and wearing shoes.  This patient presents for at risk foot care today. ? ?General Appearance  Alert, conversant and in no acute stress. ? ?Vascular  Dorsalis pedis and posterior tibial  pulses are palpable  bilaterally.  Capillary return is within normal limits  bilaterally. Temperature is within normal limits  bilaterally. ? ?Neurologic  Senn-Weinstein monofilament wire test diminished  bilaterally. Muscle power within normal limits bilaterally. ? ?Nails Thick disfigured discolored nails with subungual debris  from hallux to fifth toes bilaterally. No evidence of bacterial infection or drainage bilaterally. ? ?Orthopedic  No limitations of motion  feet .  No crepitus or effusions noted.  No bony pathology or digital deformities noted.  Hallux malleus  Hammer toes  B/L. ? ?Skin  normotropic skin with no porokeratosis noted bilaterally.  No signs of infections or ulcers noted.    ? ?Onychomycosis  Pain in right toes  Pain in left toes ? ?Consent was obtained for treatment procedures.   Mechanical debridement of nails 1-5  bilaterally performed with a nail nipper.  Filed with dremel without incident.   ? ?Return office visit   3 months                   Told patient to return for periodic foot care and evaluation due to potential at risk complications. ? ? ?Gabby Rackers DPM  ?

## 2020-10-31 ENCOUNTER — Telehealth: Payer: Self-pay

## 2020-10-31 NOTE — Telephone Encounter (Signed)
LVM for pt to call back to sched appt for diabetic shoe pick up.

## 2020-11-13 ENCOUNTER — Ambulatory Visit: Payer: Medicare Other | Admitting: Orthotics

## 2020-11-13 ENCOUNTER — Other Ambulatory Visit: Payer: Self-pay

## 2020-11-20 ENCOUNTER — Other Ambulatory Visit: Payer: Medicare Other

## 2020-11-20 DIAGNOSIS — Z20822 Contact with and (suspected) exposure to covid-19: Secondary | ICD-10-CM

## 2020-11-22 LAB — NOVEL CORONAVIRUS, NAA: SARS-CoV-2, NAA: NOT DETECTED

## 2020-11-22 LAB — SARS-COV-2, NAA 2 DAY TAT

## 2020-12-11 ENCOUNTER — Other Ambulatory Visit: Payer: Self-pay

## 2020-12-11 ENCOUNTER — Ambulatory Visit
Admission: RE | Admit: 2020-12-11 | Discharge: 2020-12-11 | Disposition: A | Discharge status: Home / Self Care | Payer: Medicare Other | Source: Ambulatory Visit | Attending: Urology | Admitting: Urology

## 2020-12-11 DIAGNOSIS — R1900 Intra-abdominal and pelvic swelling, mass and lump, unspecified site: Secondary | ICD-10-CM | POA: Diagnosis not present

## 2020-12-11 MED ORDER — GADOBUTROL 1 MMOL/ML IV SOLN
10.0000 mL | Freq: Once | INTRAVENOUS | Status: AC | PRN
  Administered 2020-12-11: 10 mL via INTRAVENOUS

## 2020-12-13 ENCOUNTER — Telehealth: Payer: Self-pay | Admitting: *Deleted

## 2020-12-13 DIAGNOSIS — K869 Disease of pancreas, unspecified: Secondary | ICD-10-CM

## 2020-12-13 DIAGNOSIS — Z8551 Personal history of malignant neoplasm of bladder: Secondary | ICD-10-CM

## 2020-12-13 NOTE — Telephone Encounter (Addendum)
Informed patient, he does not remember the facility if he had previous images. He will call back at a later date to decide if he wants further imaging in 6 months or if he can locate prior images.   ----- Message from Hollice Espy, MD sent at 12/13/2020  1:52 PM EST ----- Please let this patient know that his abdominal MRI looks fine, the pancreas looks a little bit atrophic but okay.  The radiologist did however recommend that if Mr. Hart was able to get his hands on the actual images done at the outside hospital previously, and he could compare it and no further imaging would be needed in the future.  He could bring [place] a disc which we get uploaded to the system for comparison.  Alternatively, we could repeat another MRI in 6 months.  Hollice Espy, MD

## 2020-12-13 NOTE — Telephone Encounter (Signed)
Patient contacted previous hospital, he did not have any further imaging. He would like to proceed with 6 month follow up MRI. Placed order.

## 2020-12-13 NOTE — Addendum Note (Signed)
Addended by: Verlene Mayer A on: 12/13/2020 03:21 PM   Modules accepted: Orders

## 2021-01-16 ENCOUNTER — Ambulatory Visit (INDEPENDENT_AMBULATORY_CARE_PROVIDER_SITE_OTHER): Payer: Medicare Other | Admitting: Podiatry

## 2021-01-16 ENCOUNTER — Encounter: Payer: Self-pay | Admitting: Podiatry

## 2021-01-16 ENCOUNTER — Other Ambulatory Visit: Payer: Self-pay

## 2021-01-16 DIAGNOSIS — M2030 Hallux varus (acquired), unspecified foot: Secondary | ICD-10-CM

## 2021-01-16 DIAGNOSIS — M79674 Pain in right toe(s): Secondary | ICD-10-CM | POA: Diagnosis not present

## 2021-01-16 DIAGNOSIS — M2041 Other hammer toe(s) (acquired), right foot: Secondary | ICD-10-CM

## 2021-01-16 DIAGNOSIS — E1142 Type 2 diabetes mellitus with diabetic polyneuropathy: Secondary | ICD-10-CM | POA: Diagnosis not present

## 2021-01-16 DIAGNOSIS — M2042 Other hammer toe(s) (acquired), left foot: Secondary | ICD-10-CM

## 2021-01-16 DIAGNOSIS — B351 Tinea unguium: Secondary | ICD-10-CM | POA: Diagnosis not present

## 2021-01-16 DIAGNOSIS — M79675 Pain in left toe(s): Secondary | ICD-10-CM | POA: Diagnosis not present

## 2021-01-16 NOTE — Progress Notes (Signed)
This patient returns to my office for at risk foot care.  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  This patient is unable to cut nails himself since the patient cannot reach his nails.These nails are painful walking and wearing shoes.  This patient presents for at risk foot care today. ? ?General Appearance  Alert, conversant and in no acute stress. ? ?Vascular  Dorsalis pedis and posterior tibial  pulses are palpable  bilaterally.  Capillary return is within normal limits  bilaterally. Temperature is within normal limits  bilaterally. ? ?Neurologic  Senn-Weinstein monofilament wire test diminished  bilaterally. Muscle power within normal limits bilaterally. ? ?Nails Thick disfigured discolored nails with subungual debris  from hallux to fifth toes bilaterally. No evidence of bacterial infection or drainage bilaterally. ? ?Orthopedic  No limitations of motion  feet .  No crepitus or effusions noted.  No bony pathology or digital deformities noted.  Hallux malleus  Hammer toes  B/L. ? ?Skin  normotropic skin with no porokeratosis noted bilaterally.  No signs of infections or ulcers noted.    ? ?Onychomycosis  Pain in right toes  Pain in left toes ? ?Consent was obtained for treatment procedures.   Mechanical debridement of nails 1-5  bilaterally performed with a nail nipper.  Filed with dremel without incident.   ? ?Return office visit   3 months                   Told patient to return for periodic foot care and evaluation due to potential at risk complications. ? ? ?Waymon Laser DPM  ?

## 2021-02-10 DIAGNOSIS — R011 Cardiac murmur, unspecified: Secondary | ICD-10-CM | POA: Insufficient documentation

## 2021-04-17 ENCOUNTER — Ambulatory Visit (INDEPENDENT_AMBULATORY_CARE_PROVIDER_SITE_OTHER): Payer: Medicare Other | Admitting: Podiatry

## 2021-04-17 ENCOUNTER — Other Ambulatory Visit: Payer: Self-pay

## 2021-04-17 ENCOUNTER — Encounter: Payer: Self-pay | Admitting: Podiatry

## 2021-04-17 DIAGNOSIS — B351 Tinea unguium: Secondary | ICD-10-CM | POA: Diagnosis not present

## 2021-04-17 DIAGNOSIS — M79675 Pain in left toe(s): Secondary | ICD-10-CM | POA: Diagnosis not present

## 2021-04-17 DIAGNOSIS — M79674 Pain in right toe(s): Secondary | ICD-10-CM | POA: Diagnosis not present

## 2021-04-17 DIAGNOSIS — E1142 Type 2 diabetes mellitus with diabetic polyneuropathy: Secondary | ICD-10-CM

## 2021-04-17 DIAGNOSIS — M2030 Hallux varus (acquired), unspecified foot: Secondary | ICD-10-CM

## 2021-04-17 NOTE — Progress Notes (Signed)
This patient returns to my office for at risk foot care.  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  This patient is unable to cut nails himself since the patient cannot reach his nails.These nails are painful walking and wearing shoes.  This patient presents for at risk foot care today. ? ?General Appearance  Alert, conversant and in no acute stress. ? ?Vascular  Dorsalis pedis and posterior tibial  pulses are palpable  bilaterally.  Capillary return is within normal limits  bilaterally. Temperature is within normal limits  bilaterally. ? ?Neurologic  Senn-Weinstein monofilament wire test diminished  bilaterally. Muscle power within normal limits bilaterally. ? ?Nails Thick disfigured discolored nails with subungual debris  from hallux to fifth toes bilaterally. No evidence of bacterial infection or drainage bilaterally. ? ?Orthopedic  No limitations of motion  feet .  No crepitus or effusions noted.  No bony pathology or digital deformities noted.  Hallux malleus  Hammer toes  B/L. ? ?Skin  normotropic skin with no porokeratosis noted bilaterally.  No signs of infections or ulcers noted.    ? ?Onychomycosis  Pain in right toes  Pain in left toes ? ?Consent was obtained for treatment procedures.   Mechanical debridement of nails 1-5  bilaterally performed with a nail nipper.  Filed with dremel without incident.   ? ?Return office visit   3 months                   Told patient to return for periodic foot care and evaluation due to potential at risk complications. ? ? ?Giovannina Mun DPM  ?

## 2021-04-22 ENCOUNTER — Ambulatory Visit: Payer: Medicare Other

## 2021-04-23 ENCOUNTER — Ambulatory Visit: Payer: Medicare Other | Attending: Family Medicine

## 2021-04-23 ENCOUNTER — Other Ambulatory Visit: Payer: Self-pay

## 2021-04-23 DIAGNOSIS — R2681 Unsteadiness on feet: Secondary | ICD-10-CM

## 2021-04-23 DIAGNOSIS — M6281 Muscle weakness (generalized): Secondary | ICD-10-CM | POA: Diagnosis present

## 2021-04-23 DIAGNOSIS — R2689 Other abnormalities of gait and mobility: Secondary | ICD-10-CM | POA: Diagnosis present

## 2021-04-23 NOTE — Therapy (Signed)
[place] MAIN [facility] SERVICES [address] [place], [place], [address] Phone: [phone]   Fax:  [phone]  Physical Therapy Evaluation  Patient Details  Name: Elijah Oliver MRN: [id number] Date of Birth: 06-06-1941 Referring Provider (PT): Dion Body, MD   Encounter Date: 04/23/2021   PT End of Session - 04/23/21 0949    Visit Number 1    Number of Visits 25    Date for PT Re-Evaluation 07/16/21    Authorization Type eval performed 7/85/8850, cert for 2/77/4128    PT Start Time 0804    PT Stop Time 0905    PT Time Calculation (min) 61 min    Equipment Utilized During Treatment Gait belt    Activity Tolerance Patient tolerated treatment well    Behavior During Therapy [facility] for tasks assessed/performed           Past Medical History:  Diagnosis Date  . Backache 07/23/2017  . Benign essential hypertension 07/23/2017  . Bladder cancer ([place]) 07/23/2017  . Cellulitis of great toe of left foot 07/23/2017  . CKD (chronic kidney disease) stage 3, GFR 30-59 ml/min ([place]) 07/06/2017  . 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   Overview:  Sequential left internal mammary to the mid LAD and distal LAD, SVG to the second obtuse marginal and SVG to the right coronary artery. June 24th, 2006 Dr. Jerrye Noble  . History of prostate cancer 07/02/2017  . Ingrowing nail with infection 07/23/2017  . 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 Neck sprain 07/23/2017  . Neoplasm of uncertain behavior of skin 07/23/2017  . Nephrolithiasis 07/23/2017  . Pure hypercholesterolemia 07/02/2017  . Seizure disorder ([place]) 07/02/2017  . Shoulder strain 07/23/2017  . Sleep apnea 07/23/2017  . SOB (shortness of breath) 07/23/2017  . Thoracic sprain 07/23/2017  . Type 2 diabetes mellitus with diabetic neuropathy, without long-term current use of insulin ([place]) 07/02/2017  . Urge incontinence of urine 07/23/2017    Past Surgical History:  Procedure Laterality Date  . CARDIAC  SURGERY  2006  . PROSTATECTOMY  2004  . REPLACEMENT TOTAL KNEE  2016  . TRANSURETHRAL RESECTION OF BLADDER TUMOR WITH GYRUS (TURBT-GYRUS)  2010?  Marland Kitchen URETEROSCOPY WITH HOLMIUM LASER LITHOTRIPSY  2010?    There were no vitals filed for this visit.    Subjective Assessment - 04/23/21 0759    Subjective Pt presents to exam without an AD. Pt reports his main concern is his standing balance. He states he is embarrassed to walk in front of others because he is concerned people will think he is intoxicated. Pt reports he has neuropathy and impaired sensation in BLEs. He says he goes to the doctor to have toenails clipped d/t DMII. Pt reports he does not use an AD and states, "as long as I can hold onto something I'm ok."  Pt is unsure exactly when his balance problems began. He reports he thinks it might have started around four years ago when he moved back to [place] from [place] after his wife passed from [place]. The pt denies dizziness. He reports he also has neuropathy in hands, he has history of OA and knee replacement. He reports some difficulty with vision stating he has "slight cataracts, but not enough to do anything about," and that he has "monovision." He lives alone but has family nearby. He is retired. He reports he is indep with ADLs, but sits down in shower d/t fear of falls. He says he has difficulty lifting things over his  head and balancing.    Pertinent History Pt is a pleasant 80 y/o male presenting to PT without an AD and with c/o impaired balance and gait disturbance that started over 4 years ago after his wife passed away from [place]. Per chart pt PMH includes: HTN, HLD, obesity, functional incontinence, pure hypercholesteroliemia, DMII, seizure disorder, CAD involving coronary bypass graft of native heart without angina pectoris (2006), hx of prostate CA, hx bladder CA, CKD, sleep apnea, SOB, knee replacement, per pt he has neuropathy in hands and feet    Limitations Standing;Walking;Lifting;House  hold activities;Writing   writing affected by neuropathy in hands   How long can you sit comfortably? not affected    How long can you stand comfortably? a couple minutes, must hold onto something    How long can you walk comfortably? avoids walking around people, afraid of being bumped into and falling    Diagnostic tests pt reports no recent diagnostic testing, had knee surgery in 2016    Patient Stated Goals Wants to be able to stand without feeling "wobbly" and to improve his walking.    Currently in Pain? No/denies              [facility] PT Assessment - 04/23/21 0814      Assessment   Medical Diagnosis balance problems    Referring Provider (PT) Dion Body, MD    Onset Date/Surgical Date --   4+ years ago   Hand Dominance Right    Next MD Visit July    Prior Therapy PT for knee after knee replacement      Precautions   Precautions None      Restrictions   Weight Bearing Restrictions No      Balance Screen   Has the patient fallen in the past 6 months No    Has the patient had a decrease in activity level because of a fear of falling?  Yes    Is the patient reluctant to leave their home because of a fear of falling?  No      Home Social worker Private residence    Living Arrangements Alone    Available Help at Discharge Family    Type of Home Other(Comment)    Home Access Other (comment)   lip to enter, town home   Technical brewer of Steps 0    Entrance Stairs-Rails None    Home Layout Two level    Alternate Level Stairs-Number of Steps 18    Alternate Level Stairs-Rails Left    Home Equipment Shower seat      Prior Function   Level of Independence Independent   has doctor cut his toenails because he diabetic   Vocation Retired    Leisure Doing things with people, church, digitizing records      Cognition   Overall Cognitive Status Within Functional Limits for tasks assessed      Standardized Balance Assessment   Standardized  Balance Assessment Berg Balance Test;Dynamic Gait Index      Berg Balance Test   Sit to Stand Able to stand without using hands and stabilize independently    Standing Unsupported Able to stand 2 minutes with supervision    Sitting with Back Unsupported but Feet Supported on Floor or Stool Able to sit safely and securely 2 minutes    Stand to Sit Sits safely with minimal use of hands    Transfers Able to transfer safely, minor use of hands  Standing Unsupported with Eyes Closed Needs help to keep from falling    Standing Unsupported with Feet Together Able to place feet together independently but unable to hold for 30 seconds    From Standing, Reach Forward with Outstretched Arm Can reach forward >12 cm safely (5")    From Standing Position, Pick up Object from Floor Able to pick up shoe, needs supervision    From Standing Position, Turn to Look Behind Over each Shoulder Looks behind one side only/other side shows less weight shift    Turn 360 Degrees Needs close supervision or verbal cueing    Standing Unsupported, Alternately Place Feet on Step/Stool Able to complete >2 steps/needs minimal assist    Standing Unsupported, One Foot in Front Able to take small step independently and hold 30 seconds    Standing on One Leg Unable to try or needs assist to prevent fall    Total Score 34      Dynamic Gait Index   Level Surface Normal    Change in Gait Speed Mild Impairment    Gait with Horizontal Head Turns Normal    Gait with Vertical Head Turns Normal    Gait and Pivot Turn Severe Impairment    Step Over Obstacle Normal    Step Around Obstacles Mild Impairment    Steps Mild Impairment    Total Score 18              SUBJECTIVE Chief complaint: Onset: Pt is unsure of onset. He reports after wife past away from [place] and he moved back to [place] 4+ years ago Recent changes in overall health/medication: No Directional pattern for falls: No falls Red flags (bowel/bladder changes, saddle  paresthesia, personal history of cancer, chills/fever, night sweats, unrelenting pain) Negative   OBJECTIVE  MUSCULOSKELETAL: Tremor: Absent Bulk: Normal Tone: Normal, no clonus  Posture Thoracic kyphosis, forward head posture, reduction of lordosis in lumbar spine  Gait Pt reports he does not use AD. Pt staggers occassionally when walking, posterior weight-shift on B heels, crouched posture, decreased hip ext/hip flexion throughout gait cycle, reduced B arm-swing, reduction in hip horizontal plane motion.      Strength R/L LE strength grossly 4+/5 B, decreases found in B hip flexors,B hip IR/ER, and B DF, B knee flexors   NEUROLOGICAL:  Mental Status Patient is oriented to person, place and time.  Recent memory is intact.  Remote memory is intact.  Attention span and concentration are intact.  Expressive speech is intact.  Patient's fund of knowledge is within normal limits for educational level.   Sensation Reports impaired BLE sensation, but still able to feel light touch in LEs Pt reports predominantly has issue in feet and hands  Coordination WNL   Coordination/Cerebellar Heel to Shin: WNL Rapid alternating movements: WNL   FUNCTIONAL OUTCOME MEASURES   Results Comments  BERG 34/56 Fall risk, in need of intervention  DGI 18/24   5TSTS 12.4 seconds seconds   10 Meter Gait Speed 0.86 m/s Below normative values for full community ambulation  ABC Scale 66.87 %     FOTO: 59  POSTURAL CONTROL TESTS   Modified Clinical Test of Sensory Interaction for Balance    (CTSIB):  CONDITION TIME STRATEGY SWAY  Eyes open, firm surface 30 seconds ankle Some sway  Eyes closed, firm surface Unable ankle Yes, require UE support, min a  Eyes open, foam surface 30 seconds ankle yes  Eyes closed, foam surface unable ankle Yes, requires UE and min  a    ASSESSMENT Clinical Impression: Pt is a pleasant 80 year-old male referred for difficulty with balance and gait and  mobility abnormality. PT examination reveals strength, gait, ROM, perceived functional mobility and balance deficits as evidenced by performance on Berg, DGI, 10MWT, MMT and FOTO. Pt also with impairments in LE sensation. Pt will benefit from skilled PT services to address deficits in balance, strength, gait and ROM in order to decrease fall risk and improve QOL.    PLAN Next Visit: initiate balance exercises, strengthening HEP HEP: to be issued next session    Objective measurements completed on examination: See above findings.       PT Education - 04/23/21 0949    Education Details Examination findings, goals for therapy, POC    Person(s) Educated Patient    Methods Explanation    Comprehension Verbalized understanding            PT Short Term Goals - 04/23/21 7829      PT SHORT TERM GOAL #1   Title Pt will be independent with HEP in order to improve strength and balance in order to decrease fall risk and improve function at home and work.    Baseline 5/25: to be initiated next session    Time 6    Period Weeks    Status New    Target Date 06/04/21             PT Long Term Goals - 04/23/21 0952      PT LONG TERM GOAL #1   Title Patient will increase Berg Balance score by > 6 points to demonstrate decreased fall risk during functional activities.    Baseline 5/25: 34/56    Time 12    Period Weeks    Status New    Target Date 07/16/21      PT LONG TERM GOAL #2   Title Pt will improve DGI by at least 3 points in order to demonstrate clinically significant improvement in balance and decreased risk for falls.    Baseline 5/25: 18/24    Time 12    Period Weeks    Status New    Target Date 07/16/21      PT LONG TERM GOAL #3   Title Pt will improve ABC by at least 13% in order to demonstrate clinically significant improvement in balance confidence.    Baseline 5/25: 66.87    Time 12    Period Weeks    Status New    Target Date 07/16/21      PT LONG TERM GOAL  #4   Title Patient will increase 10 meter walk test to >1.18m/s as to improve gait speed for better community ambulation and to reduce fall risk.    Baseline 5/25: 0.86 m/s    Time 12    Period Weeks    Status New    Target Date 07/16/21      PT LONG TERM GOAL #5   Title Patient will increase FOTO score to equal to or greater than 69  to demonstrate statistically significant improvement in mobility and quality of life.    Baseline 5/25: 59    Time 12    Period Weeks    Status New    Target Date 07/16/21                  Plan - 04/23/21 0827    Clinical Impression Statement Pt is a pleasant 80 year-old male referred for difficulty with  balance and gait and mobility abnormality. PT examination reveals strength, gait, ROM, perceived functional mobility and balance deficits as evidenced by performance on Berg, DGI, 10MWT, MMT and FOTO. Pt also with impairments in LE sensation. Pt will benefit from skilled PT services to address deficits in balance, strength, gait and ROM in order to decrease fall risk and improve QOL.    Personal Factors and Comorbidities Age;Fitness;Time since onset of injury/illness/exacerbation;Comorbidity 1;Comorbidity 2;Comorbidity 3+    Comorbidities pertinent per chart/pt: DMII, impaired sensation LEs, OA with hx of knee replacement, neuropathy, HTN    Examination-Activity Limitations Bathing;Carry;Lift;Stand;Locomotion Level;Transfers;Hygiene/Grooming;Reach Overhead;Stairs;Bend;Dressing;Squat    Examination-Participation Restrictions Community Activity;Shop;Volunteer;Cleaning;Yard Work;Church;Laundry;Meal Prep    Stability/Clinical Decision Making Stable/Uncomplicated    Clinical Decision Making Low    Rehab Potential Good    PT Frequency 2x / week    PT Duration 12 weeks    PT Treatment/Interventions ADLs/Self Care Home Management;Moist Heat;Cryotherapy;DME Instruction;Gait training;Stair training;Functional mobility training;Therapeutic  activities;Therapeutic exercise;Balance training;Neuromuscular re-education;Patient/family education;Orthotic Fit/Training;Manual techniques;Passive range of motion;Energy conservation;Splinting;Dry needling;Taping;Visual/perceptual remediation/compensation;Biofeedback    PT Next Visit Plan Initiate balance and strength training, HEP    PT Home Exercise Plan to be issues next visit    Consulted and Agree with Plan of Care Patient           Patient will benefit from skilled therapeutic intervention in order to improve the following deficits and impairments:  Abnormal gait,Decreased activity tolerance,Improper body mechanics,Impaired sensation,Decreased balance,Decreased coordination,Decreased mobility,Difficulty walking,Impaired vision/preception,Postural dysfunction,Decreased strength,Decreased range of motion,Decreased endurance,Hypomobility,Impaired flexibility,Pain (Pt with OA reports sometimes has pain)  Visit Diagnosis: Unsteadiness on feet  Other abnormalities of gait and mobility  Muscle weakness (generalized)     Problem List Patient Active Problem List   Diagnosis Date Noted  . 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 Adenocarcinoma of prostate ([place]) 12/01/2018  . Cellulitis 12/01/2018  . Dysarthria 12/01/2018  . HTN (hypertension) 12/01/2018  . Other hyperlipidemia 12/01/2018  . DNR (do not resuscitate) 03/07/2018  . Urge incontinence of urine 07/23/2017  . Thoracic sprain 07/23/2017  . Stye 07/23/2017  . SOB (shortness of breath) 07/23/2017  . Sleep apnea 07/23/2017  . Shoulder strain 07/23/2017  . Nephrolithiasis 07/23/2017  . Neoplasm of uncertain behavior of skin 07/23/2017  . Neck sprain 07/23/2017  . 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 Leg swelling 07/23/2017  . Ingrowing nail with infection 07/23/2017  . 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  . DM (diabetes mellitus) ([place]) 07/23/2017  . Bladder cancer ([place]) 07/23/2017  . Benign essential hypertension 07/23/2017   . Backache 07/23/2017  . Cellulitis of great toe of left foot 07/23/2017  . Seizures ([place]) 07/13/2017  . CKD (chronic kidney disease) stage 3, GFR 30-59 ml/min ([facility]) 07/06/2017  . Type 2 diabetes mellitus with diabetic neuropathy, without long-term current use of insulin ([place]) 07/02/2017  . Seizure disorder ([place]) 07/02/2017  . Pure hypercholesterolemia 07/02/2017  . Obesity (BMI 30.0-34.9) 07/02/2017  . History of prostate cancer 07/02/2017  . Coronary artery disease involving coronary bypass graft of native heart without angina pectoris 07/02/2017   Ricard Dillon PT, DPT 04/23/2021, 10:04 AM  [place] MAIN [facility] SERVICES [address] [place], [place], [address] Phone: [phone]   Fax:  [phone]  Name: Elijah Oliver MRN: [id number] Date of Birth: 1941-04-05

## 2021-04-30 ENCOUNTER — Ambulatory Visit: Payer: Medicare Other | Attending: Family Medicine

## 2021-04-30 ENCOUNTER — Other Ambulatory Visit: Payer: Self-pay

## 2021-04-30 DIAGNOSIS — R278 Other lack of coordination: Secondary | ICD-10-CM | POA: Diagnosis present

## 2021-04-30 DIAGNOSIS — R2681 Unsteadiness on feet: Secondary | ICD-10-CM | POA: Insufficient documentation

## 2021-04-30 DIAGNOSIS — R2689 Other abnormalities of gait and mobility: Secondary | ICD-10-CM | POA: Diagnosis present

## 2021-04-30 DIAGNOSIS — M6281 Muscle weakness (generalized): Secondary | ICD-10-CM | POA: Diagnosis present

## 2021-04-30 NOTE — Therapy (Signed)
[place] MAIN [facility] SERVICES [address] [place], [place], [address] Phone: [phone]   Fax:  [phone]  Physical Therapy Treatment  Patient Details  Name: Elijah Oliver MRN: [id number] Date of Birth: [birth year] Referring Provider (PT): Dion Body, MD   Encounter Date: 04/30/2021   PT End of Session - 04/30/21 0934    Visit Number 2    Number of Visits 25    Date for PT Re-Evaluation 07/16/21    Authorization Type eval performed 0/08/2724, cert for 3/66/4403    PT Start Time 0848    PT Stop Time 0928    PT Time Calculation (min) 40 min    Equipment Utilized During Treatment Gait belt    Activity Tolerance Patient tolerated treatment well    Behavior During Therapy [facility] for tasks assessed/performed           Past Medical History:  Diagnosis Date  . Backache 07/23/2017  . Benign essential hypertension 07/23/2017  . Bladder cancer ([place]) 07/23/2017  . Cellulitis of great toe of left foot 07/23/2017  . CKD (chronic kidney disease) stage 3, GFR 30-59 ml/min ([place]) 07/06/2017  . 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   Overview:  Sequential left internal mammary to the mid LAD and distal LAD, SVG to the second obtuse marginal and SVG to the right coronary artery. June 24th, 2006 Dr. Jerrye Noble  . History of prostate cancer 07/02/2017  . Ingrowing nail with infection 07/23/2017  . 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 Neck sprain 07/23/2017  . Neoplasm of uncertain behavior of skin 07/23/2017  . Nephrolithiasis 07/23/2017  . Pure hypercholesterolemia 07/02/2017  . Seizure disorder ([place]) 07/02/2017  . Shoulder strain 07/23/2017  . Sleep apnea 07/23/2017  . SOB (shortness of breath) 07/23/2017  . Thoracic sprain 07/23/2017  . Type 2 diabetes mellitus with diabetic neuropathy, without long-term current use of insulin ([place]) 07/02/2017  . Urge incontinence of urine 07/23/2017    Past Surgical History:  Procedure Laterality Date  . CARDIAC  SURGERY  2006  . PROSTATECTOMY  2004  . REPLACEMENT TOTAL KNEE  2016  . TRANSURETHRAL RESECTION OF BLADDER TUMOR WITH GYRUS (TURBT-GYRUS)  2010?  Marland Kitchen URETEROSCOPY WITH HOLMIUM LASER LITHOTRIPSY  2010?    There were no vitals filed for this visit.   Subjective Assessment - 04/30/21 0935    Subjective Pt reports no pain today. Pt reports no falls or near falls since last session. Pt reports no changes in medications.    Pertinent History Pt is a pleasant 80 y/o male presenting to PT without an AD and with c/o impaired balance and gait disturbance that started over 4 years ago after his wife passed away from [place]. Per chart pt PMH includes: HTN, HLD, obesity, functional incontinence, pure hypercholesteroliemia, DMII, seizure disorder, CAD involving coronary bypass graft of native heart without angina pectoris (2006), hx of prostate CA, hx bladder CA, CKD, sleep apnea, SOB, knee replacement, per pt he has neuropathy in hands and feet    Limitations Standing;Walking;Lifting;House hold activities;Writing   writing affected by neuropathy in hands   How long can you sit comfortably? not affected    How long can you stand comfortably? a couple minutes, must hold onto something    How long can you walk comfortably? avoids walking around people, afraid of being bumped into and falling    Diagnostic tests pt reports no recent diagnostic testing, had knee surgery in 2016    Patient Stated Goals Wants to be  able to stand without feeling "wobbly" and to improve his walking.    Currently in Pain? No/denies          TREATMENT  Neuro Re-Education: CGA-min assist provided for the following  Walking with vertical head turns - 4x  Walking with horizontal head turns with and without reading letters, numbers, symbols placed on walls - 4x  Tandem walking in // bars forward/backward - 10x; intermittent UE support forward, UUE support backward  At support bar: SLB - 4x30 sec, intermittent UE support  Tandem  stance - 2x30 sec, intermittent UE support; rates medium  Cone taps 3x10 each LE- pt rates medium; pt greater difficulty with LLE eccentric control   Standing on airex pad: 60 sec for each, pt uses intermittent to UUE support,  WBOS EC NBOS NBOS EC  On airex- WBOS weight shifts forward/backward and lateral - 10x each direction; intermittent UE support to three finger support; rates difficult   PT educated pt on HEP  Access Code: [email or web address] URL: [email or web address] Date: 04/30/2021 Prepared by: Ricard Dillon  Exercises Single Leg Stance with Support - 1 x daily - 7 x weekly - 2 sets - 2 reps - 30 hold Standing Tandem Balance with Counter Support - 1 x daily - 7 x weekly - 2 sets - 2 reps - 30 hold  Pt educated throughout in the form of demo/VC/TC to facilitate improved movement at target joints and correct muscle activation with exercises. The pt exhibits good carryover within session after cuing.    PT Education - 04/30/21 0935    Education Details HEP (see note)    Person(s) Educated Patient    Methods Explanation;Demonstration;Verbal cues;Handout    Comprehension Verbalized understanding;Returned demonstration            PT Short Term Goals - 04/23/21 9678      PT SHORT TERM GOAL #1   Title Pt will be independent with HEP in order to improve strength and balance in order to decrease fall risk and improve function at home and work.    Baseline 5/25: to be initiated next session    Time 6    Period Weeks    Status New    Target Date 06/04/21             PT Long Term Goals - 04/23/21 0952      PT LONG TERM GOAL #1   Title Patient will increase Berg Balance score by > 6 points to demonstrate decreased fall risk during functional activities.    Baseline 5/25: 34/56    Time 12    Period Weeks    Status New    Target Date 07/16/21      PT LONG TERM GOAL #2   Title Pt will improve DGI by at least 3 points in order to demonstrate clinically  significant improvement in balance and decreased risk for falls.    Baseline 5/25: 18/24    Time 12    Period Weeks    Status New    Target Date 07/16/21      PT LONG TERM GOAL #3   Title Pt will improve ABC by at least 13% in order to demonstrate clinically significant improvement in balance confidence.    Baseline 5/25: 66.87    Time 12    Period Weeks    Status New    Target Date 07/16/21      PT LONG TERM GOAL #4   Title Patient will increase 10 meter walk  test to >1.26m/s as to improve gait speed for better community ambulation and to reduce fall risk.    Baseline 5/25: 0.86 m/s    Time 12    Period Weeks    Status New    Target Date 07/16/21      PT LONG TERM GOAL #5   Title Patient will increase FOTO score to equal to or greater than 69  to demonstrate statistically significant improvement in mobility and quality of life.    Baseline 5/25: 59    Time 12    Period Weeks    Status New    Target Date 07/16/21                 Plan - 04/30/21 0931    Clinical Impression Statement Pt challeneged today with all static balance tasks and with dynamic balance tasks involving tandem walking, horizontal head turns, and horizontal head turns with dual task. Pt required intermittent UE support to UUE throughout and PT provided CGA-min assist to help pt maintain balance. PT issued HEP (see note), and reviewed frequency, reps, sets, and duration with pt as well as safety precautions with exercises. Pt verbalized understanding and was able to demo exercises with good technique. The pt will benefit from further skilled PT to improve BLE strength and balance to decrease fall risk.    Personal Factors and Comorbidities Age;Fitness;Time since onset of injury/illness/exacerbation;Comorbidity 1;Comorbidity 2;Comorbidity 3+    Comorbidities pertinent per chart/pt: DMII, impaired sensation LEs, OA with hx of knee replacement, neuropathy, HTN    Examination-Activity Limitations  Bathing;Carry;Lift;Stand;Locomotion Level;Transfers;Hygiene/Grooming;Reach Overhead;Stairs;Bend;Dressing;Squat    Examination-Participation Restrictions Community Activity;Shop;Volunteer;Cleaning;Yard Work;Church;Laundry;Meal Prep    Stability/Clinical Decision Making Stable/Uncomplicated    Rehab Potential Good    PT Frequency 2x / week    PT Duration 12 weeks    PT Treatment/Interventions ADLs/Self Care Home Management;Moist Heat;Cryotherapy;DME Instruction;Gait training;Stair training;Functional mobility training;Therapeutic activities;Therapeutic exercise;Balance training;Neuromuscular re-education;Patient/family education;Orthotic Fit/Training;Manual techniques;Passive range of motion;Energy conservation;Splinting;Dry needling;Taping;Visual/perceptual remediation/compensation;Biofeedback    PT Next Visit Plan LE strengthening, ankle rockers, gait mechanics    PT Home Exercise Plan to be issues next visit    Consulted and Agree with Plan of Care Patient           Patient will benefit from skilled therapeutic intervention in order to improve the following deficits and impairments:  Abnormal gait,Decreased activity tolerance,Improper body mechanics,Impaired sensation,Decreased balance,Decreased coordination,Decreased mobility,Difficulty walking,Impaired vision/preception,Postural dysfunction,Decreased strength,Decreased range of motion,Decreased endurance,Hypomobility,Impaired flexibility,Pain (Pt with OA reports sometimes has pain)  Visit Diagnosis: Other abnormalities of gait and mobility  Unsteadiness on feet  Muscle weakness (generalized)     Problem List Patient Active Problem List   Diagnosis Date Noted  . 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 Adenocarcinoma of prostate ([place]) 12/01/2018  . Cellulitis 12/01/2018  . Dysarthria 12/01/2018  . HTN (hypertension) 12/01/2018  . Other hyperlipidemia 12/01/2018  . DNR (do not resuscitate) 03/07/2018  . Urge incontinence of urine 07/23/2017   . Thoracic sprain 07/23/2017  . Stye 07/23/2017  . SOB (shortness of breath) 07/23/2017  . Sleep apnea 07/23/2017  . Shoulder strain 07/23/2017  . Nephrolithiasis 07/23/2017  . Neoplasm of uncertain behavior of skin 07/23/2017  . Neck sprain 07/23/2017  . 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 Leg swelling 07/23/2017  . Ingrowing nail with infection 07/23/2017  . 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  . DM (diabetes mellitus) ([place]) 07/23/2017  . Bladder cancer ([place]) 07/23/2017  . Benign essential hypertension 07/23/2017  . Backache 07/23/2017  . Cellulitis of great toe  of left foot 07/23/2017  . Seizures ([place]) 07/13/2017  . CKD (chronic kidney disease) stage 3, GFR 30-59 ml/min ([facility]) 07/06/2017  . Type 2 diabetes mellitus with diabetic neuropathy, without long-term current use of insulin ([place]) 07/02/2017  . Seizure disorder ([place]) 07/02/2017  . Pure hypercholesterolemia 07/02/2017  . Obesity (BMI 30.0-34.9) 07/02/2017  . History of prostate cancer 07/02/2017  . Coronary artery disease involving coronary bypass graft of native heart without angina pectoris 07/02/2017   Ricard Dillon PT, DPT 04/30/2021, 9:36 AM  [place] MAIN [facility] SERVICES [address] [place], [place], [address] Phone: [phone]   Fax:  [phone]  Name: AMAY MIJANGOS MRN: [id number] Date of Birth: August 29, 1941

## 2021-05-02 ENCOUNTER — Ambulatory Visit: Payer: Medicare Other

## 2021-05-02 ENCOUNTER — Other Ambulatory Visit: Payer: Self-pay

## 2021-05-02 DIAGNOSIS — M6281 Muscle weakness (generalized): Secondary | ICD-10-CM

## 2021-05-02 DIAGNOSIS — R278 Other lack of coordination: Secondary | ICD-10-CM

## 2021-05-02 DIAGNOSIS — R2689 Other abnormalities of gait and mobility: Secondary | ICD-10-CM

## 2021-05-02 DIAGNOSIS — R2681 Unsteadiness on feet: Secondary | ICD-10-CM

## 2021-05-02 NOTE — Therapy (Signed)
[place] MAIN [facility] SERVICES [address] [place], [place], [address] Phone: [phone]   Fax:  [phone]  Physical Therapy Treatment  Patient Details  Name: Elijah Oliver MRN: [id number] Date of Birth: 05/27/1941 Referring Provider (PT): Dion Body, MD   Encounter Date: 05/02/2021   PT End of Session - 05/02/21 1034    Visit Number 3    Number of Visits 25    Date for PT Re-Evaluation 07/16/21    Authorization Type eval performed 2/53/6644, cert for 0/34/7425    PT Start Time 0931    PT Stop Time 1017    PT Time Calculation (min) 46 min    Equipment Utilized During Treatment Gait belt    Activity Tolerance Patient tolerated treatment well    Behavior During Therapy [facility] for tasks assessed/performed           Past Medical History:  Diagnosis Date  . Backache 07/23/2017  . Benign essential hypertension 07/23/2017  . Bladder cancer ([place]) 07/23/2017  . Cellulitis of great toe of left foot 07/23/2017  . CKD (chronic kidney disease) stage 3, GFR 30-59 ml/min ([place]) 07/06/2017  . 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   Overview:  Sequential left internal mammary to the mid LAD and distal LAD, SVG to the second obtuse marginal and SVG to the right coronary artery. June 24th, 2006 Dr. Jerrye Noble  . History of prostate cancer 07/02/2017  . Ingrowing nail with infection 07/23/2017  . 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 Neck sprain 07/23/2017  . Neoplasm of uncertain behavior of skin 07/23/2017  . Nephrolithiasis 07/23/2017  . Pure hypercholesterolemia 07/02/2017  . Seizure disorder ([place]) 07/02/2017  . Shoulder strain 07/23/2017  . Sleep apnea 07/23/2017  . SOB (shortness of breath) 07/23/2017  . Thoracic sprain 07/23/2017  . Type 2 diabetes mellitus with diabetic neuropathy, without long-term current use of insulin ([place]) 07/02/2017  . Urge incontinence of urine 07/23/2017    Past Surgical History:  Procedure Laterality Date  . CARDIAC  SURGERY  2006  . PROSTATECTOMY  2004  . REPLACEMENT TOTAL KNEE  2016  . TRANSURETHRAL RESECTION OF BLADDER TUMOR WITH GYRUS (TURBT-GYRUS)  2010?  Marland Kitchen URETEROSCOPY WITH HOLMIUM LASER LITHOTRIPSY  2010?    There were no vitals filed for this visit.   Subjective Assessment - 05/02/21 0931    Subjective Pt reports no LOB or any changes since last appointment.    Pertinent History Pt is a pleasant 80 y/o male presenting to PT without an AD and with c/o impaired balance and gait disturbance that started over 4 years ago after his wife passed away from [place]. Per chart pt PMH includes: HTN, HLD, obesity, functional incontinence, pure hypercholesteroliemia, DMII, seizure disorder, CAD involving coronary bypass graft of native heart without angina pectoris (2006), hx of prostate CA, hx bladder CA, CKD, sleep apnea, SOB, knee replacement, per pt he has neuropathy in hands and feet    Limitations Standing;Walking;Lifting;House hold activities;Writing   writing affected by neuropathy in hands   How long can you sit comfortably? not affected    How long can you stand comfortably? a couple minutes, must hold onto something    How long can you walk comfortably? avoids walking around people, afraid of being bumped into and falling    Diagnostic tests pt reports no recent diagnostic testing, had knee surgery in 2016    Patient Stated Goals Wants to be able to stand without feeling "wobbly" and to improve his walking.  Therex:  Hip flexion marches -  2.5# AW on BLEs - 2 sets of 15 reps, progressed to 5# AW for 2 sets of 15 reps Standing knee flexion with 5# AWs on BLEs - 3x15 B  Multiple attempts at standing heel raises, difficulty with technique (pt rocking/using momentum, difficulty isolating movement), modified to seated heel raises with improved technique - 2x20 BLEs  Seated LAQ with 5# AW - 3x15; pt rates exercise as "medium"  Standing hip abduction with 5# AW - 3x12 BLEs. Difficulty  maintaining knee ext.   ANKLE ROM: PF40 deg B   Neuro Re-Education: in // bars CGA assist provided for the following  Ankle rockers on rocker board - x multiple reps. Frequent cuing for technique/demo. Pt difficulty with isolating PF.   Seated rocker board, isolating one LE at a time for PF, improvement in technique - 2x15 each LE  Standing, NBOS forward/backward weight-shifts, emphasis on anterior weight-shift as pt struggles with anterior weight-shift and tends to lose balance posteriorly (often excessive shift to heels). Use of // bar for posterior, external TC and hedgehogs placed under toes for anterior, external TC. Pt technique significantly improves as well as postural stability.   NBOS EC 2x30 sec   NBOS EO horizontal, vertical head turns - 2x30 sec rounds.   Additional round of NBOS with vertical head turns with hedgehogs placed under toes to encourage ant. Weight-shift (pt instructed to scan for LE placement as he looks down) - 2x30 sec. Pt has greater difficulty with vertical head turns compared to horizontal. .   Semi-tandem stance - 2x30 sec, intermittent UE support       Pt educated throughout in the form of demo/VC/TC to facilitate improved movement at target joints and correct muscle activation with exercises. The pt exhibits good carryover within session after cuing.     PT Education - 05/02/21 1033    Education Details Exercise technique, body mechanics with standing therex    Person(s) Educated Patient    Methods Explanation;Demonstration;Verbal cues    Comprehension Verbalized understanding;Returned demonstration            PT Short Term Goals - 04/23/21 0952      PT SHORT TERM GOAL #1   Title Pt will be independent with HEP in order to improve strength and balance in order to decrease fall risk and improve function at home and work.    Baseline 5/25: to be initiated next session    Time 6    Period Weeks    Status New    Target Date 06/04/21              PT Long Term Goals - 04/23/21 0952      PT LONG TERM GOAL #1   Title Patient will increase Berg Balance score by > 6 points to demonstrate decreased fall risk during functional activities.    Baseline 5/25: 34/56    Time 12    Period Weeks    Status New    Target Date 07/16/21      PT LONG TERM GOAL #2   Title Pt will improve DGI by at least 3 points in order to demonstrate clinically significant improvement in balance and decreased risk for falls.    Baseline 5/25: 18/24    Time 12    Period Weeks    Status New    Target Date 07/16/21      PT LONG TERM GOAL #3   Title Pt will improve ABC by  at least 13% in order to demonstrate clinically significant improvement in balance confidence.    Baseline 5/25: 66.87    Time 12    Period Weeks    Status New    Target Date 07/16/21      PT LONG TERM GOAL #4   Title Patient will increase 10 meter walk test to >1.57m/s as to improve gait speed for better community ambulation and to reduce fall risk.    Baseline 5/25: 0.86 m/s    Time 12    Period Weeks    Status New    Target Date 07/16/21      PT LONG TERM GOAL #5   Title Patient will increase FOTO score to equal to or greater than 69  to demonstrate statistically significant improvement in mobility and quality of life.    Baseline 5/25: 59    Time 12    Period Weeks    Status New    Target Date 07/16/21                 Plan - 05/02/21 1034    Clinical Impression Statement Pt highly motivated to participate in session. Pt tolerates all therex introduced well without reports of fatigue. However, pt does have difficulty with technique with heel raises and standing hip abduction, where pt struggles to maintain knee extension with both exercises. Pt then performed seated LAQ without fatigue but did require some cuing for technique. Pt also struggled with plantarflexion with ankle rockers. PF ROM taken and WFL. Pt difficulties with technique likely due to sensory  impairment in LEs, reduced proprioception and motor control. When external TC added during exercise pt LE positioning improved as well as postural stability. Pt will benefit from further skilled PT to improve BLE strength and balance to decrease fall risk.    Personal Factors and Comorbidities Age;Fitness;Time since onset of injury/illness/exacerbation;Comorbidity 1;Comorbidity 2;Comorbidity 3+    Comorbidities pertinent per chart/pt: DMII, impaired sensation LEs, OA with hx of knee replacement, neuropathy, HTN    Examination-Activity Limitations Bathing;Carry;Lift;Stand;Locomotion Level;Transfers;Hygiene/Grooming;Reach Overhead;Stairs;Bend;Dressing;Squat    Examination-Participation Restrictions Community Activity;Shop;Volunteer;Cleaning;Yard Work;Church;Laundry;Meal Prep    Stability/Clinical Decision Making Stable/Uncomplicated    Rehab Potential Good    PT Frequency 2x / week    PT Duration 12 weeks    PT Treatment/Interventions ADLs/Self Care Home Management;Moist Heat;Cryotherapy;DME Instruction;Gait training;Stair training;Functional mobility training;Therapeutic activities;Therapeutic exercise;Balance training;Neuromuscular re-education;Patient/family education;Orthotic Fit/Training;Manual techniques;Passive range of motion;Energy conservation;Splinting;Dry needling;Taping;Visual/perceptual remediation/compensation;Biofeedback    PT Next Visit Plan LE strengthening, ankle rockers, gait mechanics    PT Home Exercise Plan no changes    Consulted and Agree with Plan of Care Patient           Patient will benefit from skilled therapeutic intervention in order to improve the following deficits and impairments:  Abnormal gait,Decreased activity tolerance,Improper body mechanics,Impaired sensation,Decreased balance,Decreased coordination,Decreased mobility,Difficulty walking,Impaired vision/preception,Postural dysfunction,Decreased strength,Decreased range of motion,Decreased  endurance,Hypomobility,Impaired flexibility,Pain (Pt with OA reports sometimes has pain)  Visit Diagnosis: Unsteadiness on feet  Other abnormalities of gait and mobility  Muscle weakness (generalized)  Other lack of coordination     Problem List Patient Active Problem List   Diagnosis Date Noted  . 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 Adenocarcinoma of prostate ([place]) 12/01/2018  . Cellulitis 12/01/2018  . Dysarthria 12/01/2018  . HTN (hypertension) 12/01/2018  . Other hyperlipidemia 12/01/2018  . DNR (do not resuscitate) 03/07/2018  . Urge incontinence of urine 07/23/2017  . Thoracic sprain 07/23/2017  . Stye 07/23/2017  . SOB (shortness of  breath) 07/23/2017  . Sleep apnea 07/23/2017  . Shoulder strain 07/23/2017  . Nephrolithiasis 07/23/2017  . Neoplasm of uncertain behavior of skin 07/23/2017  . Neck sprain 07/23/2017  . 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 Leg swelling 07/23/2017  . Ingrowing nail with infection 07/23/2017  . 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  . DM (diabetes mellitus) ([place]) 07/23/2017  . Bladder cancer ([place]) 07/23/2017  . Benign essential hypertension 07/23/2017  . Backache 07/23/2017  . Cellulitis of great toe of left foot 07/23/2017  . Seizures ([place]) 07/13/2017  . CKD (chronic kidney disease) stage 3, GFR 30-59 ml/min ([facility]) 07/06/2017  . Type 2 diabetes mellitus with diabetic neuropathy, without long-term current use of insulin ([place]) 07/02/2017  . Seizure disorder ([place]) 07/02/2017  . Pure hypercholesterolemia 07/02/2017  . Obesity (BMI 30.0-34.9) 07/02/2017  . History of prostate cancer 07/02/2017  . Coronary artery disease involving coronary bypass graft of native heart without angina pectoris 07/02/2017   Ricard Dillon PT, DPT 05/02/2021, 10:55 AM  [place] MAIN [facility] SERVICES [address] [place], [place], [address] Phone: [phone]   Fax:  [phone]  Name: Elijah Oliver MRN: [id number] Date  of Birth: 21-Sep-1941

## 2021-05-06 ENCOUNTER — Other Ambulatory Visit: Payer: Self-pay

## 2021-05-06 ENCOUNTER — Ambulatory Visit: Payer: Medicare Other

## 2021-05-06 DIAGNOSIS — M6281 Muscle weakness (generalized): Secondary | ICD-10-CM

## 2021-05-06 DIAGNOSIS — R2689 Other abnormalities of gait and mobility: Secondary | ICD-10-CM

## 2021-05-06 DIAGNOSIS — R2681 Unsteadiness on feet: Secondary | ICD-10-CM

## 2021-05-06 NOTE — Therapy (Signed)
[place] MAIN [facility] SERVICES [address] [place], [place], [address] Phone: [phone]   Fax:  [phone]  Physical Therapy Treatment  Patient Details  Name: Elijah Oliver MRN: [id number] Date of Birth: 24-Dec-1940 Referring Provider (PT): Dion Body, MD   Encounter Date: 05/06/2021   PT End of Session - 05/06/21 0936    Visit Number 4    Number of Visits 25    Date for PT Re-Evaluation 07/16/21    Authorization Type eval performed 6/54/6503, cert for 5/46/5681    PT Start Time 0847    PT Stop Time 0930    PT Time Calculation (min) 43 min    Equipment Utilized During Treatment Gait belt    Activity Tolerance Patient tolerated treatment well    Behavior During Therapy [facility] for tasks assessed/performed           Past Medical History:  Diagnosis Date  . Backache 07/23/2017  . Benign essential hypertension 07/23/2017  . Bladder cancer ([place]) 07/23/2017  . Cellulitis of great toe of left foot 07/23/2017  . CKD (chronic kidney disease) stage 3, GFR 30-59 ml/min ([place]) 07/06/2017  . 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   Overview:  Sequential left internal mammary to the mid LAD and distal LAD, SVG to the second obtuse marginal and SVG to the right coronary artery. June 24th, 2006 Dr. Jerrye Noble  . History of prostate cancer 07/02/2017  . Ingrowing nail with infection 07/23/2017  . 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 Neck sprain 07/23/2017  . Neoplasm of uncertain behavior of skin 07/23/2017  . Nephrolithiasis 07/23/2017  . Pure hypercholesterolemia 07/02/2017  . Seizure disorder ([place]) 07/02/2017  . Shoulder strain 07/23/2017  . Sleep apnea 07/23/2017  . SOB (shortness of breath) 07/23/2017  . Thoracic sprain 07/23/2017  . Type 2 diabetes mellitus with diabetic neuropathy, without long-term current use of insulin ([place]) 07/02/2017  . Urge incontinence of urine 07/23/2017    Past Surgical History:  Procedure Laterality Date  . CARDIAC  SURGERY  2006  . PROSTATECTOMY  2004  . REPLACEMENT TOTAL KNEE  2016  . TRANSURETHRAL RESECTION OF BLADDER TUMOR WITH GYRUS (TURBT-GYRUS)  2010?  Marland Kitchen URETEROSCOPY WITH HOLMIUM LASER LITHOTRIPSY  2010?    There were no vitals filed for this visit.   Subjective Assessment - 05/06/21 0846    Subjective Pt reports no changes since last appointment. Pt reports no pain today.    Pertinent History Pt is a pleasant 80 y/o male presenting to PT without an AD and with c/o impaired balance and gait disturbance that started over 4 years ago after his wife passed away from [place]. Per chart pt PMH includes: HTN, HLD, obesity, functional incontinence, pure hypercholesteroliemia, DMII, seizure disorder, CAD involving coronary bypass graft of native heart without angina pectoris (2006), hx of prostate CA, hx bladder CA, CKD, sleep apnea, SOB, knee replacement, per pt he has neuropathy in hands and feet    Limitations Standing;Walking;Lifting;House hold activities;Writing   writing affected by neuropathy in hands   How long can you sit comfortably? not affected    How long can you stand comfortably? a couple minutes, must hold onto something    How long can you walk comfortably? avoids walking around people, afraid of being bumped into and falling    Diagnostic tests pt reports no recent diagnostic testing, had knee surgery in 2016    Patient Stated Goals Wants to be able to stand without feeling "wobbly" and to improve  his walking.    Currently in Pain? No/denies           TREATMENT  Therex:  Seated heel raises with 5# AW 2x15, and without weight 1x20    Seated LAQ with 5# AW - 2x20- pt rates exercise as "medium"  STS 2x10; pt loses balance posteriorly into chair/poor eccentric control and increased weight-shift on back of heels.   Standing hip abduction with 5# AW at support bar - 2x10 BLEs. Difficulty maintaining knee ext.     Neuro Re-Education: in // bars CGA assist provided for the following    Standing on airex: -Ankle rockers - 2x15; frequent VC for technique. Cuing "push toes down onto pad," intermittent UE support -Standing WBOS - 2x30 sec - intermittent UE support - NBOS - 2x30 sec - intermittent UE support -Semi-tandem stance - 2x30 sec each LE, must have at least one finger on bar for support  Hedgehog cone taps to progress SLB - 2x15 each LE. Decreased postural stability with RLE as stance leg.  Stepping forward/backward and side-to-side over orange hurdle - 15-20x for each, difficulty clearing hurdle, poor postural control/hip and ankle strategies   LE PF isolation exercise one LE on half-foam performing PF - 15x each LE   Access Code: [email or web address] URL: [email or web address] Date: 05/06/2021 Prepared by: Ricard Dillon  Exercises Seated Heel Raise - 1 x daily - 4 x weekly - 3 sets - 15 reps       Pt educated throughout in the form of demo/VC/TC to facilitate improved movement at target joints and correct muscle activation with exercises. The pt exhibits fair-good carryover within session after cuing.    PT Education - 05/06/21 0939    Education Details exercise technique, body mechanics with PF exercise    Person(s) Educated Patient    Methods Explanation;Demonstration;Tactile cues    Comprehension Verbalized understanding;Returned demonstration            PT Short Term Goals - 04/23/21 2979      PT SHORT TERM GOAL #1   Title Pt will be independent with HEP in order to improve strength and balance in order to decrease fall risk and improve function at home and work.    Baseline 5/25: to be initiated next session    Time 6    Period Weeks    Status New    Target Date 06/04/21             PT Long Term Goals - 04/23/21 0952      PT LONG TERM GOAL #1   Title Patient will increase Berg Balance score by > 6 points to demonstrate decreased fall risk during functional activities.    Baseline 5/25: 34/56    Time 12    Period Weeks    Status  New    Target Date 07/16/21      PT LONG TERM GOAL #2   Title Pt will improve DGI by at least 3 points in order to demonstrate clinically significant improvement in balance and decreased risk for falls.    Baseline 5/25: 18/24    Time 12    Period Weeks    Status New    Target Date 07/16/21      PT LONG TERM GOAL #3   Title Pt will improve ABC by at least 13% in order to demonstrate clinically significant improvement in balance confidence.    Baseline 5/25: 66.87    Time 12    Period Weeks  Status New    Target Date 07/16/21      PT LONG TERM GOAL #4   Title Patient will increase 10 meter walk test to >1.89m/s as to improve gait speed for better community ambulation and to reduce fall risk.    Baseline 5/25: 0.86 m/s    Time 12    Period Weeks    Status New    Target Date 07/16/21      PT LONG TERM GOAL #5   Title Patient will increase FOTO score to equal to or greater than 69  to demonstrate statistically significant improvement in mobility and quality of life.    Baseline 5/25: 59    Time 12    Period Weeks    Status New    Target Date 07/16/21                 Plan - 05/06/21 0933    Clinical Impression Statement Pt with improved ability to perform ankle PF with LE isolation ankle-rocker exercise in standing. He still struggles with controlled TKE. Pt also exhibits poor reactive postural control with forward/backward and lateral stepping over hurdle. Pt reports he has shoe-inserts for diabetic footwear but is not wearing them today. Pt will benefit from further skilled PT to improve BLE strength, gait mechanics and balance to decrease fall risk and increase ease with functional mobility.    Personal Factors and Comorbidities Age;Fitness;Time since onset of injury/illness/exacerbation;Comorbidity 1;Comorbidity 2;Comorbidity 3+    Comorbidities pertinent per chart/pt: DMII, impaired sensation LEs, OA with hx of knee replacement, neuropathy, HTN    Examination-Activity  Limitations Bathing;Carry;Lift;Stand;Locomotion Level;Transfers;Hygiene/Grooming;Reach Overhead;Stairs;Bend;Dressing;Squat    Examination-Participation Restrictions Community Activity;Shop;Volunteer;Cleaning;Yard Work;Church;Laundry;Meal Prep    Stability/Clinical Decision Making Stable/Uncomplicated    Rehab Potential Good    PT Frequency 2x / week    PT Duration 12 weeks    PT Treatment/Interventions ADLs/Self Care Home Management;Moist Heat;Cryotherapy;DME Instruction;Gait training;Stair training;Functional mobility training;Therapeutic activities;Therapeutic exercise;Balance training;Neuromuscular re-education;Patient/family education;Orthotic Fit/Training;Manual techniques;Passive range of motion;Energy conservation;Splinting;Dry needling;Taping;Visual/perceptual remediation/compensation;Biofeedback    PT Next Visit Plan LE strengthening, ankle rockers, gait mechanics    PT Home Exercise Plan added seated PF exercise    Consulted and Agree with Plan of Care Patient           Patient will benefit from skilled therapeutic intervention in order to improve the following deficits and impairments:  Abnormal gait,Decreased activity tolerance,Improper body mechanics,Impaired sensation,Decreased balance,Decreased coordination,Decreased mobility,Difficulty walking,Impaired vision/preception,Postural dysfunction,Decreased strength,Decreased range of motion,Decreased endurance,Hypomobility,Impaired flexibility,Pain (Pt with OA reports sometimes has pain)  Visit Diagnosis: Unsteadiness on feet  Other abnormalities of gait and mobility  Muscle weakness (generalized)     Problem List Patient Active Problem List   Diagnosis Date Noted  . 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 Adenocarcinoma of prostate ([place]) 12/01/2018  . Cellulitis 12/01/2018  . Dysarthria 12/01/2018  . HTN (hypertension) 12/01/2018  . Other hyperlipidemia 12/01/2018  . DNR (do not resuscitate) 03/07/2018  . Urge incontinence of  urine 07/23/2017  . Thoracic sprain 07/23/2017  . Stye 07/23/2017  . SOB (shortness of breath) 07/23/2017  . Sleep apnea 07/23/2017  . Shoulder strain 07/23/2017  . Nephrolithiasis 07/23/2017  . Neoplasm of uncertain behavior of skin 07/23/2017  . Neck sprain 07/23/2017  . 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 Leg swelling 07/23/2017  . Ingrowing nail with infection 07/23/2017  . 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  . DM (diabetes mellitus) ([place]) 07/23/2017  . Bladder cancer ([place]) 07/23/2017  . Benign essential hypertension 07/23/2017  . Backache 07/23/2017  .  Cellulitis of great toe of left foot 07/23/2017  . Seizures ([place]) 07/13/2017  . CKD (chronic kidney disease) stage 3, GFR 30-59 ml/min ([facility]) 07/06/2017  . Type 2 diabetes mellitus with diabetic neuropathy, without long-term current use of insulin ([place]) 07/02/2017  . Seizure disorder ([place]) 07/02/2017  . Pure hypercholesterolemia 07/02/2017  . Obesity (BMI 30.0-34.9) 07/02/2017  . History of prostate cancer 07/02/2017  . Coronary artery disease involving coronary bypass graft of native heart without angina pectoris 07/02/2017   Ricard Dillon PT, DPT 05/06/2021, 9:46 AM  [place] MAIN [facility] SERVICES [address] [place], [place], [address] Phone: [phone]   Fax:  [phone]  Name: Elijah Oliver MRN: [id number] Date of Birth: 10/11/1941

## 2021-05-08 ENCOUNTER — Other Ambulatory Visit: Payer: Self-pay

## 2021-05-08 ENCOUNTER — Ambulatory Visit: Payer: Medicare Other

## 2021-05-08 DIAGNOSIS — R2681 Unsteadiness on feet: Secondary | ICD-10-CM

## 2021-05-08 DIAGNOSIS — M6281 Muscle weakness (generalized): Secondary | ICD-10-CM

## 2021-05-08 DIAGNOSIS — R2689 Other abnormalities of gait and mobility: Secondary | ICD-10-CM | POA: Diagnosis not present

## 2021-05-08 DIAGNOSIS — R278 Other lack of coordination: Secondary | ICD-10-CM

## 2021-05-08 NOTE — Therapy (Signed)
[place] MAIN [facility] SERVICES [address] [place], [place], [address] Phone: [phone]   Fax:  [phone]  Physical Therapy Treatment  Patient Details  Name: Elijah Oliver MRN: [id number] Date of Birth: [birth year] Referring Provider (PT): Dion Body, MD   Encounter Date: 05/08/2021   PT End of Session - 05/08/21 0854     Visit Number 5    Number of Visits 25    Date for PT Re-Evaluation 07/16/21    Authorization Type Traditional Medicare A&B    Authorization Time Period eval performed 03/15/6062, cert for 0/16/0109    PT Start Time 0846    PT Stop Time 0926    PT Time Calculation (min) 40 min    Equipment Utilized During Treatment Gait belt    Activity Tolerance Patient tolerated treatment well;No increased pain    Behavior During Therapy [facility] for tasks assessed/performed             Past Medical History:  Diagnosis Date   Backache 07/23/2017   Benign essential hypertension 07/23/2017   Bladder cancer ([place]) 07/23/2017   Cellulitis of great toe of left foot 07/23/2017   CKD (chronic kidney disease) stage 3, GFR 30-59 ml/min ([place]) 07/06/2017   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   Overview:  Sequential left internal mammary to the mid LAD and distal LAD, SVG to the second obtuse marginal and SVG to the right coronary artery. June 24th, 2006 Dr. 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 07/23/2017   Neck sprain 07/23/2017   Neoplasm of uncertain behavior of skin 07/23/2017   Nephrolithiasis 07/23/2017   Pure hypercholesterolemia 07/02/2017   Seizure disorder ([place]) 07/02/2017   Shoulder strain 07/23/2017   Sleep apnea 07/23/2017   SOB (shortness of breath) 07/23/2017   Thoracic sprain 07/23/2017   Type 2 diabetes mellitus with diabetic neuropathy, without long-term current use of insulin ([place]) 07/02/2017   Urge incontinence of urine 07/23/2017    Past  Surgical History:  Procedure Laterality Date   CARDIAC SURGERY  2006   PROSTATECTOMY  2004   REPLACEMENT TOTAL KNEE  2016   TRANSURETHRAL RESECTION OF BLADDER TUMOR WITH GYRUS (TURBT-GYRUS)  2010?   URETEROSCOPY WITH HOLMIUM LASER LITHOTRIPSY  2010?    There were no vitals filed for this visit.   Subjective Assessment - 05/08/21 0854     Subjective Pt doing well in general today, no updates, no pain. HEP still quite a challenge.    Pertinent History Pt is a pleasant 80 y/o male presenting to PT without an AD and with c/o impaired balance and gait disturbance that started over 4 years ago after his wife passed away from [place]. Per chart pt PMH includes: HTN, HLD, obesity, functional incontinence, pure hypercholesteroliemia, DMII, seizure disorder, CAD involving coronary bypass graft of native heart without angina pectoris (2006), hx of prostate CA, hx bladder CA, CKD, sleep apnea, SOB, knee replacement, per pt he has neuropathy in hands and feet    Currently in Pain? No/denies             INTERVENTION THIS DATE:  overground gait-balance training c 5lbAW  -5 laps side-stepping -5 laps fwd/retro  -20x 180 degree turns, cues to increase speed over time -hands free STS from chair 1x10  -hands free STS from chair+airex 1x10 eyes closed   *unable to correct mechanics, multifactorial -BUE forward reach from chair to teach forward weight  shift x10 -hands free STS from chair 1x10 with forward leaning         PT Short Term Goals - 04/23/21 9357       PT SHORT TERM GOAL #1   Title Pt will be independent with HEP in order to improve strength and balance in order to decrease fall risk and improve function at home and work.    Baseline 5/25: to be initiated next session    Time 6    Period Weeks    Status New    Target Date 06/04/21               PT Long Term Goals - 04/23/21 0952       PT LONG TERM GOAL #1   Title Patient will increase Berg Balance score by > 6 points to  demonstrate decreased fall risk during functional activities.    Baseline 5/25: 34/56    Time 12    Period Weeks    Status New    Target Date 07/16/21      PT LONG TERM GOAL #2   Title Pt will improve DGI by at least 3 points in order to demonstrate clinically significant improvement in balance and decreased risk for falls.    Baseline 5/25: 18/24    Time 12    Period Weeks    Status New    Target Date 07/16/21      PT LONG TERM GOAL #3   Title Pt will improve ABC by at least 13% in order to demonstrate clinically significant improvement in balance confidence.    Baseline 5/25: 66.87    Time 12    Period Weeks    Status New    Target Date 07/16/21      PT LONG TERM GOAL #4   Title Patient will increase 10 meter walk test to >1.10m/s as to improve gait speed for better community ambulation and to reduce fall risk.    Baseline 5/25: 0.86 m/s    Time 12    Period Weeks    Status New    Target Date 07/16/21      PT LONG TERM GOAL #5   Title Patient will increase FOTO score to equal to or greater than 69  to demonstrate statistically significant improvement in mobility and quality of life.    Baseline 5/25: 59    Time 12    Period Weeks    Status New    Target Date 07/16/21                   Plan - 05/08/21 0858     Clinical Impression Statement Continued with current plan of care as laid out in evaluation and recent prior sessions. Extensive time spent on seated weight shift during transfers, eventually much improved, as is his balance/trunk control in transfers. Pt remains motivated to advance progress toward goals. Rest breaks provided as needed, pt quick to ask when needed. Pt does require varying levels of assistance and cuing for completion of exercises for correct form and sometimes due to pain/weakness. Pt continues to demonstrate progress toward goals AEB progression of some interventions this date either in volume or intensity.   Personal Factors and  Comorbidities Age;Fitness;Time since onset of injury/illness/exacerbation;Comorbidity 1;Comorbidity 2;Comorbidity 3+    Comorbidities pertinent per chart/pt: DMII, impaired sensation LEs, OA with hx of knee replacement, neuropathy, HTN    Examination-Activity Limitations Bathing;Carry;Lift;Stand;Locomotion Level;Transfers;Hygiene/Grooming;Reach Overhead;Stairs;Bend;Dressing;Squat    Examination-Participation Restrictions Community Activity;Shop;Volunteer;Cleaning;Yard Work;Church;Laundry;Meal Prep  Stability/Clinical Decision Making Stable/Uncomplicated    Clinical Decision Making Low    Rehab Potential Good    PT Frequency 2x / week    PT Duration 12 weeks    PT Treatment/Interventions ADLs/Self Care Home Management;Moist Heat;Cryotherapy;DME Instruction;Gait training;Stair training;Functional mobility training;Therapeutic activities;Therapeutic exercise;Balance training;Neuromuscular re-education;Patient/family education;Orthotic Fit/Training;Manual techniques;Passive range of motion;Energy conservation;Splinting;Dry needling;Taping;Visual/perceptual remediation/compensation;Biofeedback    PT Next Visit Plan Continues with strengthening and gait based balance and motor control training.    PT Home Exercise Plan No updates today    Consulted and Agree with Plan of Care Patient             Patient will benefit from skilled therapeutic intervention in order to improve the following deficits and impairments:  Abnormal gait, Decreased activity tolerance, Improper body mechanics, Impaired sensation, Decreased balance, Decreased coordination, Decreased mobility, Difficulty walking, Impaired vision/preception, Postural dysfunction, Decreased strength, Decreased range of motion, Decreased endurance, Hypomobility, Impaired flexibility, Pain  Visit Diagnosis: Other abnormalities of gait and mobility  Unsteadiness on feet  Muscle weakness (generalized)  Other lack of  coordination     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   Adenocarcinoma of prostate ([place]) 12/01/2018   Cellulitis 12/01/2018   Dysarthria 12/01/2018   HTN (hypertension) 12/01/2018   Other hyperlipidemia 12/01/2018   DNR (do not resuscitate) 03/07/2018   Urge incontinence of urine 07/23/2017   Thoracic sprain 07/23/2017   Stye 07/23/2017   SOB (shortness of breath) 07/23/2017   Sleep apnea 07/23/2017   Shoulder strain 07/23/2017   Nephrolithiasis 07/23/2017   Neoplasm of uncertain behavior of skin 07/23/2017   Neck sprain 07/23/2017   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 07/23/2017   Leg swelling 07/23/2017   Ingrowing nail with infection 07/23/2017   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   DM (diabetes mellitus) ([place]) 07/23/2017   Bladder cancer ([place]) 07/23/2017   Benign essential hypertension 07/23/2017   Backache 07/23/2017   Cellulitis of great toe of left foot 07/23/2017   Seizures ([place]) 07/13/2017   CKD (chronic kidney disease) stage 3, GFR 30-59 ml/min ([place]) 07/06/2017   Type 2 diabetes mellitus with diabetic neuropathy, without long-term current use of insulin ([place]) 07/02/2017   Seizure disorder ([place]) 07/02/2017   Pure hypercholesterolemia 07/02/2017   Obesity (BMI 30.0-34.9) 07/02/2017   History of prostate cancer 07/02/2017   Coronary artery disease involving coronary bypass graft of native heart without angina pectoris 07/02/2017   9:28 AM, 05/08/21 Etta Grandchild, PT, DPT Physical Therapist - [facility]  Outpatient Physical Therapy- [place] [phone]     [place] C 05/08/2021, 9:01 AM  [place] MAIN [facility] SERVICES [address] [place], [place], [address] Phone: [phone]   Fax:  [phone]  Name: LETICIA MCDIARMID MRN: [id number] Date of Birth: 1941/02/26

## 2021-05-12 ENCOUNTER — Other Ambulatory Visit: Payer: Self-pay

## 2021-05-12 ENCOUNTER — Ambulatory Visit: Payer: Medicare Other

## 2021-05-12 DIAGNOSIS — M6281 Muscle weakness (generalized): Secondary | ICD-10-CM

## 2021-05-12 DIAGNOSIS — R2681 Unsteadiness on feet: Secondary | ICD-10-CM

## 2021-05-12 DIAGNOSIS — R2689 Other abnormalities of gait and mobility: Secondary | ICD-10-CM

## 2021-05-12 DIAGNOSIS — R278 Other lack of coordination: Secondary | ICD-10-CM

## 2021-05-12 NOTE — Therapy (Signed)
[place] MAIN [facility] SERVICES [address] [place], [place], [address] Phone: [phone]   Fax:  [phone]  Physical Therapy Treatment  Patient Details  Name: Elijah Oliver MRN: [id number] Date of Birth: August 31, 1941 Referring Provider (PT): Dion Body, MD   Encounter Date: 05/12/2021   PT End of Session - 05/12/21 0807     Visit Number 6    Number of Visits 25    Date for PT Re-Evaluation 07/16/21    Authorization Type Traditional Medicare A&B    Authorization Time Period eval performed 9/83/3825, cert for 0/53/9767    PT Start Time 0803    PT Stop Time 0846    PT Time Calculation (min) 43 min    Equipment Utilized During Treatment Gait belt    Activity Tolerance Patient tolerated treatment well    Behavior During Therapy [facility] for tasks assessed/performed             Past Medical History:  Diagnosis Date   Backache 07/23/2017   Benign essential hypertension 07/23/2017   Bladder cancer ([place]) 07/23/2017   Cellulitis of great toe of left foot 07/23/2017   CKD (chronic kidney disease) stage 3, GFR 30-59 ml/min ([place]) 07/06/2017   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   Overview:  Sequential left internal mammary to the mid LAD and distal LAD, SVG to the second obtuse marginal and SVG to the right coronary artery. June 24th, 2006 Dr. 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 07/23/2017   Neck sprain 07/23/2017   Neoplasm of uncertain behavior of skin 07/23/2017   Nephrolithiasis 07/23/2017   Pure hypercholesterolemia 07/02/2017   Seizure disorder ([place]) 07/02/2017   Shoulder strain 07/23/2017   Sleep apnea 07/23/2017   SOB (shortness of breath) 07/23/2017   Thoracic sprain 07/23/2017   Type 2 diabetes mellitus with diabetic neuropathy, without long-term current use of insulin ([place]) 07/02/2017   Urge incontinence of urine 07/23/2017    Past Surgical History:   Procedure Laterality Date   CARDIAC SURGERY  2006   PROSTATECTOMY  2004   REPLACEMENT TOTAL KNEE  2016   TRANSURETHRAL RESECTION OF BLADDER TUMOR WITH GYRUS (TURBT-GYRUS)  2010?   URETEROSCOPY WITH HOLMIUM LASER LITHOTRIPSY  2010?    There were no vitals filed for this visit.   Subjective Assessment - 05/12/21 0804     Subjective Pt reports on Friday he got second COVID booster shot. He says he was unable to perform HEP because he was so tired.    Pertinent History Pt is a pleasant 80 y/o male presenting to PT without an AD and with c/o impaired balance and gait disturbance that started over 4 years ago after his wife passed away from [place]. Per chart pt PMH includes: HTN, HLD, obesity, functional incontinence, pure hypercholesteroliemia, DMII, seizure disorder, CAD involving coronary bypass graft of native heart without angina pectoris (2006), hx of prostate CA, hx bladder CA, CKD, sleep apnea, SOB, knee replacement, per pt he has neuropathy in hands and feet    Currently in Pain? No/denies             TREATMENT   Therex   STS with forward reach and hedgehog placed in front of forefoot for cuing 2x12; pt rates medium. He shows improvement with reps.  Standing hip abduction with 5# AW at support bar - 1x15, 1x20 BLEs. Difficulty maintaining knee ext.  Seated LAQ with 5# AW -  2x20- pt rates exercise easy    Neuro Re-Education: in // bars CGA assist provided for the following   Isolated ankle rocker on rocker board 20x each LE for DF and PF B LE ankle rocker on rockerboard 15x  Standing isolated ankle rocker on rocker board - 1x15 each LE. Frequent hands-on assist from PT for technique and block at knee to emphasize movement at ankle.   Limits of stability forward/backward, side-to-side - 10x each direction. Pt most challenged with forward/backward.  Semi tandem stance - 60 sec B  Seated ankle ROM isolate PF/DF 20x B  Pt educated throughout in the form of demo/VC/TC to  facilitate improved movement at target joints and correct muscle activation with exercises. The pt exhibits fair-good carryover within session after cuing.     PT Short Term Goals - 04/23/21 5852       PT SHORT TERM GOAL #1   Title Pt will be independent with HEP in order to improve strength and balance in order to decrease fall risk and improve function at home and work.    Baseline 5/25: to be initiated next session    Time 6    Period Weeks    Status New    Target Date 06/04/21               PT Long Term Goals - 04/23/21 0952       PT LONG TERM GOAL #1   Title Patient will increase Berg Balance score by > 6 points to demonstrate decreased fall risk during functional activities.    Baseline 5/25: 34/56    Time 12    Period Weeks    Status New    Target Date 07/16/21      PT LONG TERM GOAL #2   Title Pt will improve DGI by at least 3 points in order to demonstrate clinically significant improvement in balance and decreased risk for falls.    Baseline 5/25: 18/24    Time 12    Period Weeks    Status New    Target Date 07/16/21      PT LONG TERM GOAL #3   Title Pt will improve ABC by at least 13% in order to demonstrate clinically significant improvement in balance confidence.    Baseline 5/25: 66.87    Time 12    Period Weeks    Status New    Target Date 07/16/21      PT LONG TERM GOAL #4   Title Patient will increase 10 meter walk test to >1.26m/s as to improve gait speed for better community ambulation and to reduce fall risk.    Baseline 5/25: 0.86 m/s    Time 12    Period Weeks    Status New    Target Date 07/16/21      PT LONG TERM GOAL #5   Title Patient will increase FOTO score to equal to or greater than 69  to demonstrate statistically significant improvement in mobility and quality of life.    Baseline 5/25: 59    Time 12    Period Weeks    Status New    Target Date 07/16/21                   Plan - 05/12/21 0849     Clinical  Impression Statement Focus on ankle rockers/motor control training for dorsiflexion/plantarflexion B. Pt able to isolate movement at B ankles in seated position, but compensates with hip ROM in  standing. This compensatory strategy is impacting pt's ability to perform STS with correct technique. Pt will benefit from further skilled therapy to improve BLE strength, ROM and balance to decrease fall risk.    Personal Factors and Comorbidities Age;Fitness;Time since onset of injury/illness/exacerbation;Comorbidity 1;Comorbidity 2;Comorbidity 3+    Comorbidities pertinent per chart/pt: DMII, impaired sensation LEs, OA with hx of knee replacement, neuropathy, HTN    Examination-Activity Limitations Bathing;Carry;Lift;Stand;Locomotion Level;Transfers;Hygiene/Grooming;Reach Overhead;Stairs;Bend;Dressing;Squat    Examination-Participation Restrictions Community Activity;Shop;Volunteer;Cleaning;Yard Work;Church;Laundry;Meal Prep    Stability/Clinical Decision Making Stable/Uncomplicated    Rehab Potential Good    PT Frequency 2x / week    PT Duration 12 weeks    PT Treatment/Interventions ADLs/Self Care Home Management;Moist Heat;Cryotherapy;DME Instruction;Gait training;Stair training;Functional mobility training;Therapeutic activities;Therapeutic exercise;Balance training;Neuromuscular re-education;Patient/family education;Orthotic Fit/Training;Manual techniques;Passive range of motion;Energy conservation;Splinting;Dry needling;Taping;Visual/perceptual remediation/compensation;Biofeedback    PT Next Visit Plan Continues with strengthening and gait based balance and motor control training.    PT Home Exercise Plan No updates today    Consulted and Agree with Plan of Care Patient             Patient will benefit from skilled therapeutic intervention in order to improve the following deficits and impairments:  Abnormal gait, Decreased activity tolerance, Improper body mechanics, Impaired sensation, Decreased  balance, Decreased coordination, Decreased mobility, Difficulty walking, Impaired vision/preception, Postural dysfunction, Decreased strength, Decreased range of motion, Decreased endurance, Hypomobility, Impaired flexibility, Pain  Visit Diagnosis: Unsteadiness on feet  Muscle weakness (generalized)  Other lack of coordination  Other abnormalities of gait and mobility     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   Cellulitis 12/01/2018   Dysarthria 12/01/2018   HTN (hypertension) 12/01/2018   Other hyperlipidemia 12/01/2018   DNR (do not resuscitate) 03/07/2018   Urge incontinence of urine 07/23/2017   Thoracic sprain 07/23/2017   Stye 07/23/2017   SOB (shortness of breath) 07/23/2017   Sleep apnea 07/23/2017   Shoulder strain 07/23/2017   Nephrolithiasis 07/23/2017   Neoplasm of uncertain behavior of skin 07/23/2017   Neck sprain 07/23/2017   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 07/23/2017   Leg swelling 07/23/2017   Ingrowing nail with infection 07/23/2017   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   DM (diabetes mellitus) ([place]) 07/23/2017   Bladder cancer ([place]) 07/23/2017   Benign essential hypertension 07/23/2017   Backache 07/23/2017   Cellulitis of great toe of left foot 07/23/2017   Seizures ([place]) 07/13/2017   CKD (chronic kidney disease) stage 3, GFR 30-59 ml/min (HCC) 07/06/2017   Type 2 diabetes mellitus with diabetic neuropathy, without long-term current use of insulin ([place]) 07/02/2017   Seizure disorder ([place]) 07/02/2017   Pure hypercholesterolemia 07/02/2017   Obesity (BMI 30.0-34.9) 07/02/2017   History of prostate cancer 07/02/2017   Coronary artery disease involving coronary bypass graft of native heart without angina pectoris 07/02/2017   Ricard Dillon PT, DPT 05/12/2021, 8:56 AM  [place] [facility] MAIN [facility] SERVICES [place], [place], [address] Phone: [phone]   Fax:  [phone]  Name: Elijah Oliver MRN: [id number] Date of Birth: 05-Sep-1941

## 2021-05-15 ENCOUNTER — Ambulatory Visit: Payer: Medicare Other

## 2021-05-15 ENCOUNTER — Other Ambulatory Visit: Payer: Self-pay

## 2021-05-15 DIAGNOSIS — R2689 Other abnormalities of gait and mobility: Secondary | ICD-10-CM | POA: Diagnosis not present

## 2021-05-15 DIAGNOSIS — M6281 Muscle weakness (generalized): Secondary | ICD-10-CM

## 2021-05-15 DIAGNOSIS — R278 Other lack of coordination: Secondary | ICD-10-CM

## 2021-05-15 DIAGNOSIS — R2681 Unsteadiness on feet: Secondary | ICD-10-CM

## 2021-05-15 NOTE — Therapy (Signed)
[place] MAIN [facility] SERVICES [address] [place], [place], [address] Phone: [phone]   Fax:  [phone]  Physical Therapy Treatment  Patient Details  Name: Elijah Oliver MRN: [id number] Date of Birth: 1941/02/15 Referring Provider (PT): Dion Body, MD   Encounter Date: 05/15/2021    Past Medical History:  Diagnosis Date   Backache 07/23/2017   Benign essential hypertension 07/23/2017   Bladder cancer ([place]) 07/23/2017   Cellulitis of great toe of left foot 07/23/2017   CKD (chronic kidney disease) stage 3, GFR 30-59 ml/min ([place]) 07/06/2017   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   Overview:  Sequential left internal mammary to the mid LAD and distal LAD, SVG to the second obtuse marginal and SVG to the right coronary artery. June 24th, 2006 Dr. 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 07/23/2017   Neck sprain 07/23/2017   Neoplasm of uncertain behavior of skin 07/23/2017   Nephrolithiasis 07/23/2017   Pure hypercholesterolemia 07/02/2017   Seizure disorder ([place]) 07/02/2017   Shoulder strain 07/23/2017   Sleep apnea 07/23/2017   SOB (shortness of breath) 07/23/2017   Thoracic sprain 07/23/2017   Type 2 diabetes mellitus with diabetic neuropathy, without long-term current use of insulin ([place]) 07/02/2017   Urge incontinence of urine 07/23/2017    Past Surgical History:  Procedure Laterality Date   CARDIAC SURGERY  2006   PROSTATECTOMY  2004   REPLACEMENT TOTAL KNEE  2016   TRANSURETHRAL RESECTION OF BLADDER TUMOR WITH GYRUS (TURBT-GYRUS)  2010?   URETEROSCOPY WITH HOLMIUM LASER LITHOTRIPSY  2010?    There were no vitals filed for this visit.   Subjective Assessment - 05/15/21 0846     Subjective No falls or near-falls. Pt reports no pain, but does report neck feels stiff on the R, "like it needs a massage."    Pertinent History Pt is a pleasant 80 y/o  male presenting to PT without an AD and with c/o impaired balance and gait disturbance that started over 4 years ago after his wife passed away from [place]. Per chart pt PMH includes: HTN, HLD, obesity, functional incontinence, pure hypercholesteroliemia, DMII, seizure disorder, CAD involving coronary bypass graft of native heart without angina pectoris (2006), hx of prostate CA, hx bladder CA, CKD, sleep apnea, SOB, knee replacement, per pt he has neuropathy in hands and feet    Currently in Pain? No/denies             TREATMENT  Pt brings in shoe orthotics/inserts for assessment of gait  Pt with increased RLE supination with inserts, but not on L, as evidenced by gait analysis where pt ambulated with and without inserts over 10 meters 2x for each.  PT assessed pt B feet and skin quality. PT educated pt on checking his feet daily due to impaired BLE sensation. Pt verbalized understanding and confirms he does check his feet.  ROM of ankles: AROM R ankle 45 deg. PF, 10 deg DF AROM L ankle 51 deg. PF, 5 deg DF   TKE with GTB - 15x each LE  TKE without GTB - 15x each LE  Lunges with TKE, hands-on assist provided - 15x each LE  Balance lunges with TKE, difficulty isolating movement at knee, uses intermittent UE support - 60 sec each LE  WBOS half-foam ankle rockers - 2x20  Isolated individual LE ankle rocker with hands-on assist, cuing for pt to lok at  feet - 15x each LE  Semi tandem stance - 2x30 sec each LE; intermittent UE support, but improved postural stability  Standing NBOS - 30 sec. No decrease in postural stability, no LOB  One foot on airex and one on 6" step (SLB progression) 2x30 sec each LE. Greater difficulty with RLE as primary stance leg. Still uses intermittent UE support but less frequently with improved stability overall compared to previous sessions.  Reinforced HEP and added: Access Code: [email or web address] URL: [email or web address] Date:  05/15/2021 Prepared by: Ricard Dillon  Exercises Sit to Stand Without Arm Support - 1 x daily - 4 x weekly - 3 sets - 8 reps - PT instructed pt to do this with counter support for safety. Pt verbalized understanding.   Education provided throughout session via VC/TC and demonstration to facilitate movement at target joints and correct muscle activation for all testing and exercises performed. Close CGA was provided for all exercises.   PT Short Term Goals - 04/23/21 5409       PT SHORT TERM GOAL #1   Title Pt will be independent with HEP in order to improve strength and balance in order to decrease fall risk and improve function at home and work.    Baseline 5/25: to be initiated next session    Time 6    Period Weeks    Status New    Target Date 06/04/21               PT Long Term Goals - 04/23/21 0952       PT LONG TERM GOAL #1   Title Patient will increase Berg Balance score by > 6 points to demonstrate decreased fall risk during functional activities.    Baseline 5/25: 34/56    Time 12    Period Weeks    Status New    Target Date 07/16/21      PT LONG TERM GOAL #2   Title Pt will improve DGI by at least 3 points in order to demonstrate clinically significant improvement in balance and decreased risk for falls.    Baseline 5/25: 18/24    Time 12    Period Weeks    Status New    Target Date 07/16/21      PT LONG TERM GOAL #3   Title Pt will improve ABC by at least 13% in order to demonstrate clinically significant improvement in balance confidence.    Baseline 5/25: 66.87    Time 12    Period Weeks    Status New    Target Date 07/16/21      PT LONG TERM GOAL #4   Title Patient will increase 10 meter walk test to >1.31m/s as to improve gait speed for better community ambulation and to reduce fall risk.    Baseline 5/25: 0.86 m/s    Time 12    Period Weeks    Status New    Target Date 07/16/21      PT LONG TERM GOAL #5   Title Patient will increase FOTO  score to equal to or greater than 69  to demonstrate statistically significant improvement in mobility and quality of life.    Baseline 5/25: 59    Time 12    Period Weeks    Status New    Target Date 07/16/21                    Patient will benefit from skilled therapeutic intervention in order to  improve the following deficits and impairments:     Visit Diagnosis: No diagnosis found.     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   Cellulitis 12/01/2018   Dysarthria 12/01/2018   HTN (hypertension) 12/01/2018   Other hyperlipidemia 12/01/2018   DNR (do not resuscitate) 03/07/2018   Urge incontinence of urine 07/23/2017   Thoracic sprain 07/23/2017   Stye 07/23/2017   SOB (shortness of breath) 07/23/2017   Sleep apnea 07/23/2017   Shoulder strain 07/23/2017   Nephrolithiasis 07/23/2017   Neoplasm of uncertain behavior of skin 07/23/2017   Neck sprain 07/23/2017   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 07/23/2017   Leg swelling 07/23/2017   Ingrowing nail with infection 07/23/2017   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   DM (diabetes mellitus) ([place]) 07/23/2017   Bladder cancer ([place]) 07/23/2017   Benign essential hypertension 07/23/2017   Backache 07/23/2017   Cellulitis of great toe of left foot 07/23/2017   Seizures ([place]) 07/13/2017   CKD (chronic kidney disease) stage 3, GFR 30-59 ml/min (HCC) 07/06/2017   Type 2 diabetes mellitus with diabetic neuropathy, without long-term current use of insulin ([place]) 07/02/2017   Seizure disorder ([place]) 07/02/2017   Pure hypercholesterolemia 07/02/2017   Obesity (BMI 30.0-34.9) 07/02/2017   History of prostate cancer 07/02/2017   Coronary artery disease involving coronary bypass graft of native heart without angina pectoris 07/02/2017   Ricard Dillon PT, DPT 05/15/2021, 8:47 AM  [place] MAIN [facility]  SERVICES [address] [place], [place], [address] Phone: [phone]   Fax:  [phone]  Name: Elijah Oliver MRN: [id number] Date of Birth: Aug 19, 1941

## 2021-05-16 ENCOUNTER — Other Ambulatory Visit: Payer: Medicare Other | Admitting: Urology

## 2021-05-19 ENCOUNTER — Ambulatory Visit: Payer: Medicare Other

## 2021-05-19 ENCOUNTER — Other Ambulatory Visit: Payer: Self-pay

## 2021-05-19 DIAGNOSIS — R278 Other lack of coordination: Secondary | ICD-10-CM

## 2021-05-19 DIAGNOSIS — R2681 Unsteadiness on feet: Secondary | ICD-10-CM

## 2021-05-19 DIAGNOSIS — R2689 Other abnormalities of gait and mobility: Secondary | ICD-10-CM | POA: Diagnosis not present

## 2021-05-19 DIAGNOSIS — M6281 Muscle weakness (generalized): Secondary | ICD-10-CM
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
## 2021-05-22 ENCOUNTER — Other Ambulatory Visit: Payer: Self-pay

## 2021-05-22 ENCOUNTER — Ambulatory Visit: Payer: Medicare Other

## 2021-05-22 DIAGNOSIS — R2681 Unsteadiness on feet: Secondary | ICD-10-CM

## 2021-05-22 DIAGNOSIS — M6281 Muscle weakness (generalized): Secondary | ICD-10-CM

## 2021-05-22 DIAGNOSIS — R2689 Other abnormalities of gait and mobility: Secondary | ICD-10-CM | POA: Diagnosis not present

## 2021-05-22 NOTE — Therapy (Deleted)
[place] MAIN [facility] SERVICES [address] [place], [place], [address] Phone: [phone]   Fax:  [phone]  Physical Therapy Treatment  Patient Details  Name: Elijah Oliver MRN: [id number] Date of Birth: 03-Sep-1941 Referring Provider (PT): Dion Body, MD   Encounter Date: 05/22/2021    Past Medical History:  Diagnosis Date   Backache 07/23/2017   Benign essential hypertension 07/23/2017   Bladder cancer ([place]) 07/23/2017   Cellulitis of great toe of left foot 07/23/2017   CKD (chronic kidney disease) stage 3, GFR 30-59 ml/min ([place]) 07/06/2017   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   Overview:  Sequential left internal mammary to the mid LAD and distal LAD, SVG to the second obtuse marginal and SVG to the right coronary artery. June 24th, 2006 Dr. 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 07/23/2017   Neck sprain 07/23/2017   Neoplasm of uncertain behavior of skin 07/23/2017   Nephrolithiasis 07/23/2017   Pure hypercholesterolemia 07/02/2017   Seizure disorder ([place]) 07/02/2017   Shoulder strain 07/23/2017   Sleep apnea 07/23/2017   SOB (shortness of breath) 07/23/2017   Thoracic sprain 07/23/2017   Type 2 diabetes mellitus with diabetic neuropathy, without long-term current use of insulin ([name]) 07/02/2017   Urge incontinence of urine 07/23/2017    Past Surgical History:  Procedure Laterality Date   CARDIAC SURGERY  2006   PROSTATECTOMY  2004   REPLACEMENT TOTAL KNEE  2016   TRANSURETHRAL RESECTION OF BLADDER TUMOR WITH GYRUS (TURBT-GYRUS)  2010?   URETEROSCOPY WITH HOLMIUM LASER LITHOTRIPSY  2010?    There were no vitals filed for this visit.  847  Pt does well with NBOS and WBOS feet elevated on half-foam placing pt in dorsiflexed position. Pt to perform with half-foam under heels next session to bias plantarflexion to further challenge coordination and  balance reactions of ankles. TREATMENT   THEREX-  Ankle rockers on even surface 2x15  Half foam in PF and DF 2x60 sec for each  Standing on half bosue with 2-finger support   Semi-tandem 2x30 sec each LE on aires  NBOS on airex, intermittent UE support 60 sec   Therex   Ankle inversion/eversion with RTB - 2x15 each for each LE; pt rates medium   Seated heel raises 1x15, 2.5# 1x15 and 1x10   Seated LAQ with 10# AW 2x15, 1x10   Seated ankle dorsiflexion with 10# AW 3x10        Assessment: Pt challenged with standing dorsiflexion and plantarflexion. Must perform in seated for correct technique due to decreased strength and proprioception deficits of ankle musculature. Pt does well with NBOS and WBOS feet elevated on half-foam placing pt in dorsiflexed position. Pt to perform with half-foam under heels next session to bias plantarflexion to further challenge coordination and balance reactions of ankles. The pt will benefit from further skilled PT to improve LE strength, ROM and balance to decrease fall risk.      Education provided throughout session via VC/TC and demonstration to facilitate movement at target joints and correct muscle activation for all testing and exercises performed. Close CGA was provided for all exercises.                             PT Short Term Goals - 04/23/21 0952       PT SHORT TERM GOAL #1  Title Pt will be independent with HEP in order to improve strength and balance in order to decrease fall risk and improve function at home and work.    Baseline 5/25: to be initiated next session    Time 6    Period Weeks    Status New    Target Date 06/04/21               PT Long Term Goals - 04/23/21 0952       PT LONG TERM GOAL #1   Title Patient will increase Berg Balance score by > 6 points to demonstrate decreased fall risk during functional activities.    Baseline 5/25: 34/56    Time 12    Period Weeks    Status  New    Target Date 07/16/21      PT LONG TERM GOAL #2   Title Pt will improve DGI by at least 3 points in order to demonstrate clinically significant improvement in balance and decreased risk for falls.    Baseline 5/25: 18/24    Time 12    Period Weeks    Status New    Target Date 07/16/21      PT LONG TERM GOAL #3   Title Pt will improve ABC by at least 13% in order to demonstrate clinically significant improvement in balance confidence.    Baseline 5/25: 66.87    Time 12    Period Weeks    Status New    Target Date 07/16/21      PT LONG TERM GOAL #4   Title Patient will increase 10 meter walk test to >1.2m/s as to improve gait speed for better community ambulation and to reduce fall risk.    Baseline 5/25: 0.86 m/s    Time 12    Period Weeks    Status New    Target Date 07/16/21      PT LONG TERM GOAL #5   Title Patient will increase FOTO score to equal to or greater than 69  to demonstrate statistically significant improvement in mobility and quality of life.    Baseline 5/25: 59    Time 12    Period Weeks    Status New    Target Date 07/16/21                    Patient will benefit from skilled therapeutic intervention in order to improve the following deficits and impairments:     Visit Diagnosis: No diagnosis found.     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   Adenocarcinoma of prostate ([place]) 12/01/2018   Cellulitis 12/01/2018   Dysarthria 12/01/2018   HTN (hypertension) 12/01/2018   Other hyperlipidemia 12/01/2018   DNR (do not resuscitate) 03/07/2018   Urge incontinence of urine 07/23/2017   Thoracic sprain 07/23/2017   Stye 07/23/2017   SOB (shortness of breath) 07/23/2017   Sleep apnea 07/23/2017   Shoulder strain 07/23/2017   Nephrolithiasis 07/23/2017   Neoplasm of uncertain behavior of skin 07/23/2017   Neck sprain 07/23/2017   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 07/23/2017   Leg  swelling 07/23/2017   Ingrowing nail with infection 07/23/2017   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   DM (diabetes mellitus) ([place]) 07/23/2017   Bladder cancer ([place]) 07/23/2017   Benign essential hypertension 07/23/2017   Backache 07/23/2017   Cellulitis of great toe of left foot 07/23/2017   Seizures ([name]) 07/13/2017   CKD (  chronic kidney disease) stage 3, GFR 30-59 ml/min ([facility]) 07/06/2017   Type 2 diabetes mellitus with diabetic neuropathy, without long-term current use of insulin ([place]) 07/02/2017   Seizure disorder ([place]) 07/02/2017   Pure hypercholesterolemia 07/02/2017   Obesity (BMI 30.0-34.9) 07/02/2017   History of prostate cancer 07/02/2017   Coronary artery disease involving coronary bypass graft of native heart without angina pectoris 07/02/2017    Zollie Pee 05/22/2021, 8:46 AM  [place] MAIN [facility] [place] [facility] SERVICES [place], [place], [address] Phone: [phone]   Fax:  [phone]  Name: QUIN MATHENIA MRN: [id number] Date of Birth: [birth year]

## 2021-05-22 NOTE — Therapy (Signed)
[place] MAIN [facility] SERVICES [address] [place], [place], [address] Phone: [phone]   Fax:  [phone]  Physical Therapy Treatment  Patient Details  Name: Elijah Oliver MRN: [id number] Date of Birth: 11-16-1941 Referring Provider (PT): Dion Body, MD   Encounter Date: 05/22/2021   PT End of Session - 05/22/21 1503     Visit Number 9    Number of Visits 25    Date for PT Re-Evaluation 07/16/21    Authorization Type Traditional Medicare A&B    Authorization Time Period eval performed 6/75/9163, cert for 8/46/6599    PT Start Time 0847    PT Stop Time 0929    PT Time Calculation (min) 42 min    Equipment Utilized During Treatment Gait belt    Activity Tolerance Patient tolerated treatment well    Behavior During Therapy [facility] for tasks assessed/performed             Past Medical History:  Diagnosis Date   Backache 07/23/2017   Benign essential hypertension 07/23/2017   Bladder cancer ([place]) 07/23/2017   Cellulitis of great toe of left foot 07/23/2017   CKD (chronic kidney disease) stage 3, GFR 30-59 ml/min ([place]) 07/06/2017   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   Overview:  Sequential left internal mammary to the mid LAD and distal LAD, SVG to the second obtuse marginal and SVG to the right coronary artery. June 24th, 2006 Dr. 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 07/23/2017   Neck sprain 07/23/2017   Neoplasm of uncertain behavior of skin 07/23/2017   Nephrolithiasis 07/23/2017   Pure hypercholesterolemia 07/02/2017   Seizure disorder ([place]) 07/02/2017   Shoulder strain 07/23/2017   Sleep apnea 07/23/2017   SOB (shortness of breath) 07/23/2017   Thoracic sprain 07/23/2017   Type 2 diabetes mellitus with diabetic neuropathy, without long-term current use of insulin ([place]) 07/02/2017   Urge incontinence of urine 07/23/2017    Past Surgical History:   Procedure Laterality Date   CARDIAC SURGERY  2006   PROSTATECTOMY  2004   REPLACEMENT TOTAL KNEE  2016   TRANSURETHRAL RESECTION OF BLADDER TUMOR WITH GYRUS (TURBT-GYRUS)  2010?   URETEROSCOPY WITH HOLMIUM LASER LITHOTRIPSY  2010?    There were no vitals filed for this visit.   Subjective Assessment - 05/22/21 0849     Subjective Pt reports doing HEP. Pt reports some soreness in LLE after performing HEP.    Pertinent History Pt is a pleasant 80 y/o male presenting to PT without an AD and with c/o impaired balance and gait disturbance that started over 4 years ago after his wife passed away from [place]. Per chart pt PMH includes: HTN, HLD, obesity, functional incontinence, pure hypercholesteroliemia, DMII, seizure disorder, CAD involving coronary bypass graft of native heart without angina pectoris (2006), hx of prostate CA, hx bladder CA, CKD, sleep apnea, SOB, knee replacement, per pt he has neuropathy in hands and feet    Currently in Pain? No/denies              TREATMENT   Neuro Re-education-  At support surface-  CGA provided for all --Ankle rockers on even surface -- 2x15, struggles to perform heel raise, but demo's good DF. --Standing still on half-foam in PF/DF -- 2x60 sec for each, greater difficulty with PF. --Standing on half bosu with 2-finger support - 3 min. Decreased stability noted at ankles, but  no LOB.  On airex pad, at support surface, CGA provided for all: --Semi-tandem - 2x30 sec each LE --NBOS - 2x60 sec; pt uses intermittent UE support but no LOB  Therex-  Seated: Ankle inversion/eversion with RTB - 2x15 each LE for each; pt rates medium  Seated heel raises 1x15 without weight, progressed to 2.5# on each LE for 1x15 and 1x10 LAQ with 10# AW each LE - 2x15, 1x10 Seated ankle dorsiflexion with 10# AW - 3x10        Assessment: Pt requires no more than CGA for all balance exercises on this date. The pt was able to perform standing on half-bosu with 2  finger support without LOB, indicating improvement between sessions. Pt still challenged with strengthening for all ankle exercises. The pt will benefit from further skilled therapy to improve BLE strength and balance in order to increase safety with all functional mobility.       Education provided throughout session via VC/TC and demonstration to facilitate movement at target joints and correct muscle activation for all  exercises performed.        PT Education - 05/22/21 1503     Education Details exercise technique, body mechanics with DF/PF on half-foam    Person(s) Educated Patient    Methods Explanation;Demonstration;Verbal cues    Comprehension Verbalized understanding;Returned demonstration              PT Short Term Goals - 04/23/21 0175       PT SHORT TERM GOAL #1   Title Pt will be independent with HEP in order to improve strength and balance in order to decrease fall risk and improve function at home and work.    Baseline 5/25: to be initiated next session    Time 6    Period Weeks    Status New    Target Date 06/04/21               PT Long Term Goals - 04/23/21 0952       PT LONG TERM GOAL #1   Title Patient will increase Berg Balance score by > 6 points to demonstrate decreased fall risk during functional activities.    Baseline 5/25: 34/56    Time 12    Period Weeks    Status New    Target Date 07/16/21      PT LONG TERM GOAL #2   Title Pt will improve DGI by at least 3 points in order to demonstrate clinically significant improvement in balance and decreased risk for falls.    Baseline 5/25: 18/24    Time 12    Period Weeks    Status New    Target Date 07/16/21      PT LONG TERM GOAL #3   Title Pt will improve ABC by at least 13% in order to demonstrate clinically significant improvement in balance confidence.    Baseline 5/25: 66.87    Time 12    Period Weeks    Status New    Target Date 07/16/21      PT LONG TERM GOAL #4   Title  Patient will increase 10 meter walk test to >1.75m/s as to improve gait speed for better community ambulation and to reduce fall risk.    Baseline 5/25: 0.86 m/s    Time 12    Period Weeks    Status New    Target Date 07/16/21      PT LONG TERM GOAL #5   Title Patient  will increase FOTO score to equal to or greater than 69  to demonstrate statistically significant improvement in mobility and quality of life.    Baseline 5/25: 59    Time 12    Period Weeks    Status New    Target Date 07/16/21                   Plan - 05/22/21 1513     Clinical Impression Statement Pt requires no more than CGA for all balance exercises on this date. The pt was able to perform standing on half-bosu with 2 finger support without LOB, indicating improvement between sessions. Pt still challenged with strengthening for all ankle exercises. The pt will benefit from further skilled therapy to improve BLE strength and balance in order to increase safety with all functional mobility.    Personal Factors and Comorbidities Age;Fitness;Time since onset of injury/illness/exacerbation;Comorbidity 1;Comorbidity 2;Comorbidity 3+    Comorbidities pertinent per chart/pt: DMII, impaired sensation LEs, OA with hx of knee replacement, neuropathy, HTN    Examination-Activity Limitations Bathing;Carry;Lift;Stand;Locomotion Level;Transfers;Hygiene/Grooming;Reach Overhead;Stairs;Bend;Dressing;Squat    Examination-Participation Restrictions Community Activity;Shop;Volunteer;Cleaning;Yard Work;Church;Laundry;Meal Prep    Stability/Clinical Decision Making Stable/Uncomplicated    Rehab Potential Good    PT Frequency 2x / week    PT Duration 12 weeks    PT Treatment/Interventions ADLs/Self Care Home Management;Moist Heat;Cryotherapy;DME Instruction;Gait training;Stair training;Functional mobility training;Therapeutic activities;Therapeutic exercise;Balance training;Neuromuscular re-education;Patient/family education;Orthotic  Fit/Training;Manual techniques;Passive range of motion;Energy conservation;Splinting;Dry needling;Taping;Visual/perceptual remediation/compensation;Biofeedback    PT Next Visit Plan Continues with strengthening and gait based balance and motor control training, half foam under heels balance/coordination exercise    PT Home Exercise Plan No updates today    Consulted and Agree with Plan of Care Patient             Patient will benefit from skilled therapeutic intervention in order to improve the following deficits and impairments:  Abnormal gait, Decreased activity tolerance, Improper body mechanics, Impaired sensation, Decreased balance, Decreased coordination, Decreased mobility, Difficulty walking, Impaired vision/preception, Postural dysfunction, Decreased strength, Decreased range of motion, Decreased endurance, Hypomobility, Impaired flexibility, Pain  Visit Diagnosis: Unsteadiness on feet  Muscle weakness (generalized)  Other abnormalities of gait and mobility     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   Cellulitis 12/01/2018   Dysarthria 12/01/2018   HTN (hypertension) 12/01/2018   Other hyperlipidemia 12/01/2018   DNR (do not resuscitate) 03/07/2018   Urge incontinence of urine 07/23/2017   Thoracic sprain 07/23/2017   Stye 07/23/2017   SOB (shortness of breath) 07/23/2017   Sleep apnea 07/23/2017   Shoulder strain 07/23/2017   Nephrolithiasis 07/23/2017   Neoplasm of uncertain behavior of skin 07/23/2017   Neck sprain 07/23/2017   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 07/23/2017   Leg swelling 07/23/2017   Ingrowing nail with infection 07/23/2017   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   DM (diabetes mellitus) ([place]) 07/23/2017   Bladder cancer ([place]) 07/23/2017   Benign essential hypertension 07/23/2017   Backache 07/23/2017   Cellulitis of great toe of left foot 07/23/2017    Seizures ([place]) 07/13/2017   CKD (chronic kidney disease) stage 3, GFR 30-59 ml/min ([facility]) 07/06/2017   Type 2 diabetes mellitus with diabetic neuropathy, without long-term current use of insulin ([place]) 07/02/2017   Seizure disorder ([place]) 07/02/2017   Pure hypercholesterolemia 07/02/2017   Obesity (BMI 30.0-34.9) 07/02/2017   History of prostate cancer 07/02/2017   Coronary artery disease  involving coronary bypass graft of native heart without angina pectoris 07/02/2017   Ricard Dillon PT, DPT  05/22/2021, 3:17 PM  [place] MAIN [facility] SERVICES [address] [place], [place], [address] Phone: [phone]   Fax:  [phone]  Name: Elijah Oliver MRN: [id number] Date of Birth: 01-19-1941

## 2021-05-23 ENCOUNTER — Other Ambulatory Visit: Payer: Self-pay

## 2021-05-23 ENCOUNTER — Other Ambulatory Visit: Payer: Medicare Other | Admitting: Urology

## 2021-05-23 ENCOUNTER — Other Ambulatory Visit
Admission: RE | Admit: 2021-05-23 | Discharge: 2021-05-23 | Disposition: A | Discharge status: Home / Self Care | Payer: Medicare Other | Attending: Urology | Admitting: Urology

## 2021-05-23 ENCOUNTER — Ambulatory Visit (INDEPENDENT_AMBULATORY_CARE_PROVIDER_SITE_OTHER): Payer: Medicare Other | Admitting: Urology

## 2021-05-23 VITALS — BP 161/76 | HR 62 | Ht 71.0 in | Wt 213.0 lb

## 2021-05-23 DIAGNOSIS — N3941 Urge incontinence: Secondary | ICD-10-CM | POA: Diagnosis not present

## 2021-05-23 DIAGNOSIS — N393 Stress incontinence (female) (male): Secondary | ICD-10-CM

## 2021-05-23 DIAGNOSIS — Z8551 Personal history of malignant neoplasm of bladder: Secondary | ICD-10-CM

## 2021-05-23 DIAGNOSIS — N1831 Chronic kidney disease, stage 3a: Secondary | ICD-10-CM | POA: Diagnosis not present

## 2021-05-23 DIAGNOSIS — K869 Disease of pancreas, unspecified: Secondary | ICD-10-CM

## 2021-05-23 LAB — URINALYSIS, COMPLETE (UACMP) WITH MICROSCOPIC
Bacteria, UA: NONE SEEN
Bilirubin Urine: NEGATIVE
Glucose, UA: NEGATIVE mg/dL
Ketones, ur: NEGATIVE mg/dL
Leukocytes,Ua: NEGATIVE
Nitrite: NEGATIVE
Protein, ur: 100 mg/dL — AB
Specific Gravity, Urine: 1.02 (ref 1.005–1.030)
pH: 6.5 (ref 5.0–8.0)

## 2021-05-23 NOTE — Progress Notes (Signed)
   05/10/20   CC:  Chief Complaint  Patient presents with   Cysto     HPI: Elijah Oliver is a 80 y.o. M with multiple GU issues who returns for routine annual follow-up for cysto.  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  History of prostate cancer Personal history of prostate cancer Prostatectomy 2004, open radical.  No recurrence.  PSA 0.01 in 5/21.  No repeat this past year.    SUI AUS placed 2008, revised 08 to double cuff.   Cuff currently permanently deactivated given his inability to use his hands and cycle cuff More active he is the more he leaks - stable  Urinary urgency Stable on myrbetriq 50 mg  Tried interval off of medication which was worse Oxybutynin not as good as mybetriq 50 mg   History of kidney stones ESWL x 3 (2013, 2013, 2015) No recent flank pain.  No stones on CT from 11/2016 No interval imaging No flank pain this year   History of bladder cancer TgTa TCC 01/2012 incidental at the time of stone surgery Continues annual surviellence   CKD Baseline Cr 1.5, most recent Cr 1.4 3/7   History of urosepsis Recent hospital admission 11/2016 from [facility]  CT scan abd/ pelvis w/o contrast 11/2016 describes "fuzziness" in left UJP  --> reviewed scan personally, very subtile in setting of active infection F/u    Erectile dysfuction Baseline severe ED  Vitals:   05/23/21 1505  BP: (!) 161/76  Pulse: 62    NED. A&Ox3.   No respiratory distress   Abd soft, NT, ND Normal phallus with bilateral descended testicles  Cystoscopy Procedure Note  Patient identification was confirmed, informed consent was obtained, and patient was prepped using Betadine solution.  Lidocaine jelly was administered per urethral meatus.     Pre-Procedure: - Inspection reveals a normal caliber ureteral meatus.  Cuff was checked prior to introducing the scope and appeared to be permanently  deactivated in position.  Procedure: The flexible cystoscope was introduced without difficulty - No urethral strictures/lesions are present.  There is a tandem cuff sphincter in his bulbar urethra. . No urethral erosions appreciated. - Surgically absent prostate  - Normal bladder neck - Bilateral ureteral orifices identified - Bladder mucosa  reveals no ulcers, tumors, or lesions - No bladder stones - No trabeculation  Retroflexion unremarkable.   Post-Procedure: - Patient tolerated the procedure well  Assessment/ Plan:  1. History of bladder cancer Cystoscopy today NED Shared decision making to continue annual surveillance cystoscopy  2. History of prostate cancer PSA up to date  PSA 0.01 in 5/21 Will recheck next year if not done by PCP prior to visit  3. Lesion of pancreas Discussed whether or not to continue following this questionable lesion, stable on MRI x2 He does not want to continue repeating these at this time thus will defer  4. Urge incontinence of urine No longer taking Myrbetriq, reports that it stopped working but does not want to try any new meds  5. Stage 3a chronic kidney disease (HCC) Cr stable  6. Stress incontinence of urine Tandem cath in place and functional but is unable to cycle to secondary to hand neuropathy thus leaves it permanently deactivated    F/u annual cysto   Hollice Espy, MD

## 2021-05-26 ENCOUNTER — Ambulatory Visit: Payer: Medicare Other

## 2021-05-26 ENCOUNTER — Other Ambulatory Visit: Payer: Self-pay

## 2021-05-26 DIAGNOSIS — R2681 Unsteadiness on feet: Secondary | ICD-10-CM

## 2021-05-26 DIAGNOSIS — R2689 Other abnormalities of gait and mobility: Secondary | ICD-10-CM | POA: Diagnosis not present

## 2021-05-26 DIAGNOSIS — M6281 Muscle weakness (generalized): Secondary | ICD-10-CM

## 2021-05-26 NOTE — Therapy (Signed)
[place] MAIN [facility] SERVICES [address] [place], [place], [address] Phone: [phone]   Fax:  [phone]  Physical Therapy Treatment/Physical Therapy Progress Note   Dates of reporting period  04/23/2021   to   05/26/2021   Patient Details  Name: Elijah Oliver MRN: [id number] Date of Birth: [birth year] Referring Provider (PT): Dion Body, MD   Encounter Date: 05/26/2021   PT End of Session - 05/26/21 1702     Visit Number 10    Number of Visits 25    Date for PT Re-Evaluation 07/16/21    Authorization Type Traditional Medicare A&B    Authorization Time Period eval performed 5/36/6440, cert for 3/47/4259    PT Start Time 0802    PT Stop Time 0845    PT Time Calculation (min) 43 min    Equipment Utilized During Treatment Gait belt    Activity Tolerance Patient tolerated treatment well    Behavior During Therapy [facility] for tasks assessed/performed             Past Medical History:  Diagnosis Date   Backache 07/23/2017   Benign essential hypertension 07/23/2017   Bladder cancer ([place]) 07/23/2017   Cellulitis of great toe of left foot 07/23/2017   CKD (chronic kidney disease) stage 3, GFR 30-59 ml/min ([place]) 07/06/2017   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   Overview:  Sequential left internal mammary to the mid LAD and distal LAD, SVG to the second obtuse marginal and SVG to the right coronary artery. June 24th, 2006 Dr. 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 07/23/2017   Neck sprain 07/23/2017   Neoplasm of uncertain behavior of skin 07/23/2017   Nephrolithiasis 07/23/2017   Pure hypercholesterolemia 07/02/2017   Seizure disorder ([place]) 07/02/2017   Shoulder strain 07/23/2017   Sleep apnea 07/23/2017   SOB (shortness of breath) 07/23/2017   Thoracic sprain 07/23/2017   Type 2 diabetes mellitus with diabetic neuropathy, without long-term current use of  insulin ([place]) 07/02/2017   Urge incontinence of urine 07/23/2017    Past Surgical History:  Procedure Laterality Date   CARDIAC SURGERY  2006   PROSTATECTOMY  2004   REPLACEMENT TOTAL KNEE  2016   TRANSURETHRAL RESECTION OF BLADDER TUMOR WITH GYRUS (TURBT-GYRUS)  2010?   URETEROSCOPY WITH HOLMIUM LASER LITHOTRIPSY  2010?    There were no vitals filed for this visit.    Subjective Assessment - 05/26/21 1701     Subjective Pt reports no major changes since previous session. Pt reports no pain.    Pertinent History Pt is a pleasant 80 y/o male presenting to PT without an AD and with c/o impaired balance and gait disturbance that started over 4 years ago after his wife passed away from [place]. Per chart pt PMH includes: HTN, HLD, obesity, functional incontinence, pure hypercholesteroliemia, DMII, seizure disorder, CAD involving coronary bypass graft of native heart without angina pectoris (2006), hx of prostate CA, hx bladder CA, CKD, sleep apnea, SOB, knee replacement, per pt he has neuropathy in hands and feet    Currently in Pain? No/denies             [facility] PT Assessment - 05/26/21 0001       Berg Balance Test   Sit to Stand Able to stand without using hands and stabilize independently    Standing Unsupported Able to stand safely 2 minutes    Sitting  with Back Unsupported but Feet Supported on Floor or Stool Able to sit safely and securely 2 minutes    Stand to Sit Sits safely with minimal use of hands    Transfers Able to transfer safely, minor use of hands    Standing Unsupported with Eyes Closed Able to stand 10 seconds with supervision    Standing Unsupported with Feet Together Able to place feet together independently and stand for 1 minute with supervision    From Standing, Reach Forward with Outstretched Arm Can reach forward >12 cm safely (5")    From Standing Position, Pick up Object from Crane to pick up shoe safely and easily    From Standing Position, Turn to Look  Behind Over each Shoulder Looks behind from both sides and weight shifts well    Turn 360 Degrees Able to turn 360 degrees safely one side only in 4 seconds or less    Standing Unsupported, Alternately Place Feet on Step/Stool Able to complete 4 steps without aid or supervision    Standing Unsupported, One Foot in Front Able to take small step independently and hold 30 seconds    Standing on One Leg Unable to try or needs assist to prevent fall    Total Score 44      Dynamic Gait Index   Level Surface Normal    Change in Gait Speed Normal    Gait with Horizontal Head Turns Mild Impairment    Gait with Vertical Head Turns Normal    Gait and Pivot Turn Mild Impairment    Step Over Obstacle Normal    Step Around Obstacles Normal    Steps Mild Impairment    Total Score 21             TREATMENT  Reassessment of goals this session  FOTO: 57   Berg: 44/56 (achieved)  DGI: 21/24 (achieved)  ABC Scale: 70.63%  10MWT: 0.97 m/s (partially met)  M-CTSIB: 1: yes, can maintain 2. Can maintain 15 sec, increased instances of LOB 3. Can maintain 15 sec, increased instances of LOB 4. Unable, multiple instances of LOB  One LE on airex and one on incline step - 2x30 sec each LE, CGA provided  Patient's condition has the potential to improve in response to therapy. Maximum improvement is yet to be obtained. The anticipated improvement is attainable and reasonable in a generally predictable time.      Plan - 05/26/21 1724     Clinical Impression Statement Goals/outcomes reassessed this session. Pt showing progress toward majority of goals, achieving two goals and partially meeting one other goal. Pt only saw a slight decrease in FOTO score (57).  M-CTSIB assessment completed, where pt had greatest difficulty with conditions where pt was unable to rely on visual cues. Greatest deficit seen in condition 4 indicating pt has difficulty utilizing vestibular cues for balance. The pt will  benefit from further skilled PT to improve BLE strength and balance in order to decrease fall risk.    Personal Factors and Comorbidities Age;Fitness;Time since onset of injury/illness/exacerbation;Comorbidity 1;Comorbidity 2;Comorbidity 3+    Comorbidities pertinent per chart/pt: DMII, impaired sensation LEs, OA with hx of knee replacement, neuropathy, HTN    Examination-Activity Limitations Bathing;Carry;Lift;Stand;Locomotion Level;Transfers;Hygiene/Grooming;Reach Overhead;Stairs;Bend;Dressing;Squat    Examination-Participation Restrictions Community Activity;Shop;Volunteer;Cleaning;Yard Work;Church;Laundry;Meal Prep    Stability/Clinical Decision Making Stable/Uncomplicated    Rehab Potential Good    PT Frequency 2x / week    PT Duration 12 weeks    PT Treatment/Interventions ADLs/Self Care  Home Management;Moist Heat;Cryotherapy;DME Instruction;Gait training;Stair training;Functional mobility training;Therapeutic activities;Therapeutic exercise;Balance training;Neuromuscular re-education;Patient/family education;Orthotic Fit/Training;Manual techniques;Passive range of motion;Energy conservation;Splinting;Dry needling;Taping;Visual/perceptual remediation/compensation;Biofeedback    PT Next Visit Plan Continues with strengthening and gait based balance and motor control training, half foam under heels balance/coordination exercise, continue POC as previously indicated    PT Home Exercise Plan No updates today    Consulted and Agree with Plan of Care Patient               PT Education - 05/26/21 1702     Education Details exercise technique, body mechanics    Person(s) Educated Patient    Methods Explanation;Demonstration;Verbal cues    Comprehension Verbalized understanding;Returned demonstration              PT Short Term Goals - 05/26/21 0804       PT SHORT TERM GOAL #1   Title Pt will be independent with HEP in order to improve strength and balance in order to decrease fall  risk and improve function at home and work.    Baseline 5/25: to be initiated next session; 6/27: pt indep with HEP    Time 6    Period Weeks    Status Achieved    Target Date 06/04/21               PT Long Term Goals - 05/26/21 0806       PT LONG TERM GOAL #1   Title Patient will increase Berg Balance score by > 6 points to demonstrate decreased fall risk during functional activities.    Baseline 5/25: 34/56; 6/27: 44/56    Time 12    Period Weeks    Status Achieved    Target Date 07/16/21      PT LONG TERM GOAL #2   Title Pt will improve DGI by at least 3 points in order to demonstrate clinically significant improvement in balance and decreased risk for falls.    Baseline 5/25: 18/24; 6/27: 21/24    Time 12    Period Weeks    Status Achieved    Target Date 07/16/21      PT LONG TERM GOAL #3   Title Pt will improve ABC by at least 13% in order to demonstrate clinically significant improvement in balance confidence.    Baseline 5/25: 66.87; 6/27: 70.63%    Time 12    Period Weeks    Status On-going    Target Date 07/16/21      PT LONG TERM GOAL #4   Title Patient will increase 10 meter walk test to >1.61ms as to improve gait speed for better community ambulation and to reduce fall risk.    Baseline 5/25: 0.86 m/s; 6/27: 0.97 m/s    Time 12    Period Weeks    Status Partially Met    Target Date 07/16/21      PT LONG TERM GOAL #5   Title Patient will increase FOTO score to equal to or greater than 69  to demonstrate statistically significant improvement in mobility and quality of life.    Baseline 5/25: 59; 6/27: 57    Time 12    Period Weeks    Status On-going    Target Date 07/16/21      Additional Long Term Goals   Additional Long Term Goals Yes      PT LONG TERM GOAL #6   Title Pt will demonstrate ability to maintain SLB for at least 5 seconds on each  LE in order to improve ability to safely navigate up/down curbs and over obstacles.    Baseline 6/27:  Pt unable to maintain SLB on either LE    Time 12    Period Weeks    Status New    Target Date 08/18/21              Patient will benefit from skilled therapeutic intervention in order to improve the following deficits and impairments:  Abnormal gait, Decreased activity tolerance, Improper body mechanics, Impaired sensation, Decreased balance, Decreased coordination, Decreased mobility, Difficulty walking, Impaired vision/preception, Postural dysfunction, Decreased strength, Decreased range of motion, Decreased endurance, Hypomobility, Impaired flexibility, Pain  Visit Diagnosis: Unsteadiness on feet  Other abnormalities of gait and mobility  Muscle weakness (generalized)     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   Cellulitis 12/01/2018   Dysarthria 12/01/2018   HTN (hypertension) 12/01/2018   Other hyperlipidemia 12/01/2018   DNR (do not resuscitate) 03/07/2018   Urge incontinence of urine 07/23/2017   Thoracic sprain 07/23/2017   Stye 07/23/2017   SOB (shortness of breath) 07/23/2017   Sleep apnea 07/23/2017   Shoulder strain 07/23/2017   Nephrolithiasis 07/23/2017   Neoplasm of uncertain behavior of skin 07/23/2017   Neck sprain 07/23/2017   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 07/23/2017   Leg swelling 07/23/2017   Ingrowing nail with infection 07/23/2017   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   DM (diabetes mellitus) ([place]) 07/23/2017   Bladder cancer ([place]) 07/23/2017   Benign essential hypertension 07/23/2017   Backache 07/23/2017   Cellulitis of great toe of left foot 07/23/2017   Seizures ([place]) 07/13/2017   CKD (chronic kidney disease) stage 3, GFR 30-59 ml/min ([facility]) 07/06/2017   Type 2 diabetes mellitus with diabetic neuropathy, without long-term current use of insulin ([place]) 07/02/2017   Seizure disorder ([place]) 07/02/2017   Pure hypercholesterolemia 07/02/2017    Obesity (BMI 30.0-34.9) 07/02/2017   History of prostate cancer 07/02/2017   Coronary artery disease involving coronary bypass graft of native heart without angina pectoris 07/02/2017   Ricard Dillon PT, DPT 05/26/2021, 5:30 PM  [place] MAIN [facility] SERVICES [address] [place], [place], [address] Phone: [phone]   Fax:  [phone]  Name: Elijah Oliver MRN: [id number] Date of Birth: 15-Sep-1941

## 2021-05-29 ENCOUNTER — Ambulatory Visit: Payer: Medicare Other

## 2021-05-29 ENCOUNTER — Other Ambulatory Visit: Payer: Self-pay

## 2021-05-29 DIAGNOSIS — R2689 Other abnormalities of gait and mobility: Secondary | ICD-10-CM

## 2021-05-29 DIAGNOSIS — R2681 Unsteadiness on feet: Secondary | ICD-10-CM

## 2021-05-29 DIAGNOSIS — M6281 Muscle weakness (generalized): Secondary | ICD-10-CM

## 2021-05-29 NOTE — Therapy (Signed)
[place] MAIN [facility] SERVICES [address] [place], [place], [address] Phone: [phone]   Fax:  [phone]  Physical Therapy Treatment  Patient Details  Name: Elijah Oliver MRN: [id number] Date of Birth: 1941-06-12 Referring Provider (PT): Dion Body, MD   Encounter Date: 05/29/2021   PT End of Session - 05/29/21 1028     Visit Number 11    Number of Visits 25    Date for PT Re-Evaluation 07/16/21    Authorization Type Traditional Medicare A&B    Authorization Time Period eval performed 8/67/5449, cert for 12/31/69    PT Start Time 0843    PT Stop Time 0930    PT Time Calculation (min) 47 min    Equipment Utilized During Treatment Gait belt    Activity Tolerance Patient tolerated treatment well    Behavior During Therapy [facility] for tasks assessed/performed             Past Medical History:  Diagnosis Date   Backache 07/23/2017   Benign essential hypertension 07/23/2017   Bladder cancer ([place]) 07/23/2017   Cellulitis of great toe of left foot 07/23/2017   CKD (chronic kidney disease) stage 3, GFR 30-59 ml/min ([place]) 07/06/2017   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   Overview:  Sequential left internal mammary to the mid LAD and distal LAD, SVG to the second obtuse marginal and SVG to the right coronary artery. June 24th, 2006 Dr. 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 07/23/2017   Neck sprain 07/23/2017   Neoplasm of uncertain behavior of skin 07/23/2017   Nephrolithiasis 07/23/2017   Pure hypercholesterolemia 07/02/2017   Seizure disorder ([place]) 07/02/2017   Shoulder strain 07/23/2017   Sleep apnea 07/23/2017   SOB (shortness of breath) 07/23/2017   Thoracic sprain 07/23/2017   Type 2 diabetes mellitus with diabetic neuropathy, without long-term current use of insulin ([place]) 07/02/2017   Urge incontinence of urine 07/23/2017    Past Surgical History:   Procedure Laterality Date   CARDIAC SURGERY  2006   PROSTATECTOMY  2004   REPLACEMENT TOTAL KNEE  2016   TRANSURETHRAL RESECTION OF BLADDER TUMOR WITH GYRUS (TURBT-GYRUS)  2010?   URETEROSCOPY WITH HOLMIUM LASER LITHOTRIPSY  2010?    There were no vitals filed for this visit.   Subjective Assessment - 05/29/21 0843     Subjective Pt reports no pain. Pt reports no changes since previous session. He still feels challenged with standing balance. Pt performing HEP.    Pertinent History Pt is a pleasant 80 y/o male presenting to PT without an AD and with c/o impaired balance and gait disturbance that started over 4 years ago after his wife passed away from [place]. Per chart pt PMH includes: HTN, HLD, obesity, functional incontinence, pure hypercholesteroliemia, DMII, seizure disorder, CAD involving coronary bypass graft of native heart without angina pectoris (2006), hx of prostate CA, hx bladder CA, CKD, sleep apnea, SOB, knee replacement, per pt he has neuropathy in hands and feet    Currently in Pain? No/denies            TREATMENT  Neuro Re-education - CGA provided for all  In // bars: -Pt standing on half-foam, heels on foam biasing PF - 60 sec -Pt standing on half-foam, toes on foam biasing DF - 60 sec -progressed each exercise to pt performing with vertical and horizontal head-turns - 10x each direction for each position.  Pt uses intermittent UE support. More challenged with vertical head turns and with ankles in DF.  -Ankle rockers on half-foam into PF/DF BLEs - 20x, with BUE support. Significant improvement in ability to PF ankles.  -One LE on airex pad and one LE on 6" step - 2x30 sec for each LE  Pt standing on airex: - WBOS - 60 sec; improved postural stability, no use of UE support - NBOS - 60 sec; improved postural stability, one to two instances of UE support  Pt standing on firm surface, one-to-two finger support: -WBOS, EC - 2x60; pt rates challenging --progressed to  performing with vertical, horizontal head turns 10x for each, each direction; pt rates very challenging.  -NBOS EC - 2x60  Pt standing, holding colorful physioball, visually tracking ball as he moves it side-to-side - 10x; very challenging for pt, frequent use of stepping strategies to regain balance  Education provided throughout session primarily in the form of VC/Demo in order to facilitate movement at target joints and correct muscle activation. Pt exhibits good carryover within session after cuing.    Plan - 05/29/21 1028     Clinical Impression Statement Pt continues to be highly motivated in PT session. He continues to show improvement with static balance tasks, requiring less frequent UE support and exhibiting improved postural stability, particularly with ankle strategies. The pt is most challenged with balance tasks where he must rely more on vestibular cues. The pt will benefit from further skilled PT to improve static and dynamic balance in order to decrease fall risk and increase QOL.    Personal Factors and Comorbidities Age;Fitness;Time since onset of injury/illness/exacerbation;Comorbidity 1;Comorbidity 2;Comorbidity 3+    Comorbidities pertinent per chart/pt: DMII, impaired sensation LEs, OA with hx of knee replacement, neuropathy, HTN    Examination-Activity Limitations Bathing;Carry;Lift;Stand;Locomotion Level;Transfers;Hygiene/Grooming;Reach Overhead;Stairs;Bend;Dressing;Squat    Examination-Participation Restrictions Community Activity;Shop;Volunteer;Cleaning;Yard Work;Church;Laundry;Meal Prep    Stability/Clinical Decision Making Stable/Uncomplicated    Rehab Potential Good    PT Frequency 2x / week    PT Duration 12 weeks    PT Treatment/Interventions ADLs/Self Care Home Management;Moist Heat;Cryotherapy;DME Instruction;Gait training;Stair training;Functional mobility training;Therapeutic activities;Therapeutic exercise;Balance training;Neuromuscular  re-education;Patient/family education;Orthotic Fit/Training;Manual techniques;Passive range of motion;Energy conservation;Splinting;Dry needling;Taping;Visual/perceptual remediation/compensation;Biofeedback    PT Next Visit Plan Continues with strengthening and gait based balance and motor control training, half foam under heels balance/coordination exercise, balance tasks that rely on vesetibular cues    PT Home Exercise Plan No updates today    Consulted and Agree with Plan of Care Patient               PT Education - 05/29/21 1027     Education Details exercise technique, body mechanics    Person(s) Educated Patient    Methods Explanation;Demonstration;Verbal cues    Comprehension Verbalized understanding;Returned demonstration              PT Short Term Goals - 05/26/21 0804       PT SHORT TERM GOAL #1   Title Pt will be independent with HEP in order to improve strength and balance in order to decrease fall risk and improve function at home and work.    Baseline 5/25: to be initiated next session; 6/27: pt indep with HEP    Time 6    Period Weeks    Status Achieved    Target Date 06/04/21               PT Long Term Goals - 05/26/21 8882  PT LONG TERM GOAL #1   Title Patient will increase Berg Balance score by > 6 points to demonstrate decreased fall risk during functional activities.    Baseline 5/25: 34/56; 6/27: 44/56    Time 12    Period Weeks    Status Achieved    Target Date 07/16/21      PT LONG TERM GOAL #2   Title Pt will improve DGI by at least 3 points in order to demonstrate clinically significant improvement in balance and decreased risk for falls.    Baseline 5/25: 18/24; 6/27: 21/24    Time 12    Period Weeks    Status Achieved    Target Date 07/16/21      PT LONG TERM GOAL #3   Title Pt will improve ABC by at least 13% in order to demonstrate clinically significant improvement in balance confidence.    Baseline 5/25: 66.87; 6/27:  70.63%    Time 12    Period Weeks    Status On-going    Target Date 07/16/21      PT LONG TERM GOAL #4   Title Patient will increase 10 meter walk test to >1.63ms as to improve gait speed for better community ambulation and to reduce fall risk.    Baseline 5/25: 0.86 m/s; 6/27: 0.97 m/s    Time 12    Period Weeks    Status Partially Met    Target Date 07/16/21      PT LONG TERM GOAL #5   Title Patient will increase FOTO score to equal to or greater than 69  to demonstrate statistically significant improvement in mobility and quality of life.    Baseline 5/25: 59; 6/27: 57    Time 12    Period Weeks    Status On-going    Target Date 07/16/21      Additional Long Term Goals   Additional Long Term Goals Yes      PT LONG TERM GOAL #6   Title Pt will demonstrate ability to maintain SLB for at least 5 seconds on each LE in order to improve ability to safely navigate up/down curbs and over obstacles.    Baseline 6/27: Pt unable to maintain SLB on either LE    Time 12    Period Weeks    Status New    Target Date 08/18/21             Patient will benefit from skilled therapeutic intervention in order to improve the following deficits and impairments:  Abnormal gait, Decreased activity tolerance, Improper body mechanics, Impaired sensation, Decreased balance, Decreased coordination, Decreased mobility, Difficulty walking, Impaired vision/preception, Postural dysfunction, Decreased strength, Decreased range of motion, Decreased endurance, Hypomobility, Impaired flexibility, Pain  Visit Diagnosis: Unsteadiness on feet  Other abnormalities of gait and mobility  Muscle weakness (generalized)     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   [place]denocarcinoma of prostate ([place] 12/01/2018   Cellulitis 12/01/2018   Dysarthria 12/01/2018   HTN (hypertension) 12/01/2018   Other hyperlipidemia 12/01/2018   DNR (do not resuscitate)  03/07/2018   Urge incontinence of urine 07/23/2017   Thoracic sprain 07/23/2017   Stye 07/23/2017   SOB (shortness of breath) 07/23/2017   Sleep apnea 07/23/2017   Shoulder strain 07/23/2017   Nephrolithiasis 07/23/2017   Neoplasm of uncertain behavior of skin 07/23/2017   Neck sprain 07/23/2017   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 07/23/2017   Leg swelling  07/23/2017   Ingrowing nail with infection 07/23/2017   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   DM (diabetes mellitus) ([place]) 07/23/2017   Bladder cancer ([place]) 07/23/2017   Benign essential hypertension 07/23/2017   Backache 07/23/2017   Cellulitis of great toe of left foot 07/23/2017   Seizures ([place]) 07/13/2017   CKD (chronic kidney disease) stage 3, GFR 30-59 ml/min ([facility]) 07/06/2017   Type 2 diabetes mellitus with diabetic neuropathy, without long-term current use of insulin ([place]) 07/02/2017   Seizure disorder ([place]) 07/02/2017   Pure hypercholesterolemia 07/02/2017   Obesity (BMI 30.0-34.9) 07/02/2017   History of prostate cancer 07/02/2017   Coronary artery disease involving coronary bypass graft of native heart without angina pectoris 07/02/2017   Ricard Dillon PT, DPT 05/29/2021, 10:38 AM  [place], [place], [address] Phone: [phone]   Fax:  [phone]  Name: SHAINE NEWMARK MRN: [id number] Date of Birth: 05-26-1941

## 2021-06-03 ENCOUNTER — Ambulatory Visit: Payer: Medicare Other | Attending: Family Medicine

## 2021-06-03 ENCOUNTER — Other Ambulatory Visit: Payer: Self-pay

## 2021-06-03 DIAGNOSIS — R278 Other lack of coordination: Secondary | ICD-10-CM

## 2021-06-03 DIAGNOSIS — R2681 Unsteadiness on feet: Secondary | ICD-10-CM | POA: Diagnosis present

## 2021-06-03 DIAGNOSIS — R2689 Other abnormalities of gait and mobility: Secondary | ICD-10-CM

## 2021-06-03 DIAGNOSIS — M6281 Muscle weakness (generalized): Secondary | ICD-10-CM | POA: Insufficient documentation

## 2021-06-03 NOTE — Therapy (Signed)
[place] MAIN [facility] SERVICES [address] [place], [place], [address] Phone: [phone]   Fax:  [phone]  Physical Therapy Treatment  Patient Details  Name: WILMA WUTHRICH MRN: [id number] Date of Birth: February 07, 1941 Referring Provider (PT): Dion Body, MD   Encounter Date: 06/03/2021   PT End of Session - 06/03/21 0755     Visit Number 12    Number of Visits 25    Date for PT Re-Evaluation 07/16/21    Authorization Type Traditional Medicare A&B    Authorization Time Period eval performed 1/74/0814, cert for 4/81/8563    PT Start Time 0803    PT Stop Time 0845    PT Time Calculation (min) 42 min    Equipment Utilized During Treatment Gait belt    Activity Tolerance Patient tolerated treatment well    Behavior During Therapy [facility] for tasks assessed/performed             Past Medical History:  Diagnosis Date   Backache 07/23/2017   Benign essential hypertension 07/23/2017   Bladder cancer ([place]) 07/23/2017   Cellulitis of great toe of left foot 07/23/2017   CKD (chronic kidney disease) stage 3, GFR 30-59 ml/min ([place]) 07/06/2017   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   Overview:  Sequential left internal mammary to the mid LAD and distal LAD, SVG to the second obtuse marginal and SVG to the right coronary artery. June 24th, 2006 Dr. 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 07/23/2017   Neck sprain 07/23/2017   Neoplasm of uncertain behavior of skin 07/23/2017   Nephrolithiasis 07/23/2017   Pure hypercholesterolemia 07/02/2017   Seizure disorder ([place]) 07/02/2017   Shoulder strain 07/23/2017   Sleep apnea 07/23/2017   SOB (shortness of breath) 07/23/2017   Thoracic sprain 07/23/2017   Type 2 diabetes mellitus with diabetic neuropathy, without long-term current use of insulin ([place]) 07/02/2017   Urge incontinence of urine 07/23/2017    Past Surgical History:   Procedure Laterality Date   CARDIAC SURGERY  2006   PROSTATECTOMY  2004   REPLACEMENT TOTAL KNEE  2016   TRANSURETHRAL RESECTION OF BLADDER TUMOR WITH GYRUS (TURBT-GYRUS)  2010?   URETEROSCOPY WITH HOLMIUM LASER LITHOTRIPSY  2010?    There were no vitals filed for this visit.   Subjective Assessment - 06/03/21 0756     Subjective Pt reports he had a good weekend. No other major changes reported. Pt reports "I stumble around whenever I first get up, but not close to falling. I'm just half-asleep." He states, "I have to hold onto the wall when I get out of the bed."    Pertinent History Pt is a pleasant 80 y/o male presenting to PT without an AD and with c/o impaired balance and gait disturbance that started over 4 years ago after his wife passed away from [place]. Per chart pt PMH includes: HTN, HLD, obesity, functional incontinence, pure hypercholesteroliemia, DMII, seizure disorder, CAD involving coronary bypass graft of native heart without angina pectoris (2006), hx of prostate CA, hx bladder CA, CKD, sleep apnea, SOB, knee replacement, per pt he has neuropathy in hands and feet    Currently in Pain? No/denies              TREATMENT  Neuro Re-education - CGA provided for all  In // bars: -Pt standing on half-foam, heels on foam biasing PF - 60 sec -Pt standing on  half-foam, toes on foam biasing DF - 60 sec -progressed each exercise to pt performing with vertical and horizontal head-turns - x multiple times each direction each direction for each head-turn  High-knee marches - x multiple reps; intermittent UE support; very challenging for pt  -Seated and then standing ankle rockers on rocker board into PF/DF BLEs - 20x each, with BUE support. PT provides hands-on assist for technique in standing with PF.  -One LE on airex pad and one LE on 6" step -4x60 sec for each LE; more challenging for the RLE as stance leg. --progressed to adding in vertical, horizontal head turns- 10x each  direction for each   Manually resisted STS 10x; pt rates "medium"  Seated PF 20x ; to be progressed with weight to transition to standing  Education provided throughout session primarily in the form of VC/Demo in order to facilitate movement at target joints and correct muscle activation. Pt exhibits fair-good carryover within session after cuing.     PT Education - 06/03/21 0849     Education Details exercise technique, body mechanis with standing/seated PF exercise    Person(s) Educated Patient    Methods Explanation;Tactile cues;Demonstration;Verbal cues    Comprehension Returned demonstration;Verbalized understanding;Tactile cues required;Need further instruction              PT Short Term Goals - 05/26/21 0804       PT SHORT TERM GOAL #1   Title Pt will be independent with HEP in order to improve strength and balance in order to decrease fall risk and improve function at home and work.    Baseline 5/25: to be initiated next session; 6/27: pt indep with HEP    Time 6    Period Weeks    Status Achieved    Target Date 06/04/21               PT Long Term Goals - 05/26/21 0806       PT LONG TERM GOAL #1   Title Patient will increase Berg Balance score by > 6 points to demonstrate decreased fall risk during functional activities.    Baseline 5/25: 34/56; 6/27: 44/56    Time 12    Period Weeks    Status Achieved    Target Date 07/16/21      PT LONG TERM GOAL #2   Title Pt will improve DGI by at least 3 points in order to demonstrate clinically significant improvement in balance and decreased risk for falls.    Baseline 5/25: 18/24; 6/27: 21/24    Time 12    Period Weeks    Status Achieved    Target Date 07/16/21      PT LONG TERM GOAL #3   Title Pt will improve ABC by at least 13% in order to demonstrate clinically significant improvement in balance confidence.    Baseline 5/25: 66.87; 6/27: 70.63%    Time 12    Period Weeks    Status On-going    Target  Date 07/16/21      PT LONG TERM GOAL #4   Title Patient will increase 10 meter walk test to >1.64m/s as to improve gait speed for better community ambulation and to reduce fall risk.    Baseline 5/25: 0.86 m/s; 6/27: 0.97 m/s    Time 12    Period Weeks    Status Partially Met    Target Date 07/16/21      PT LONG TERM GOAL #5   Title Patient  will increase FOTO score to equal to or greater than 69  to demonstrate statistically significant improvement in mobility and quality of life.    Baseline 5/25: 59; 6/27: 57    Time 12    Period Weeks    Status On-going    Target Date 07/16/21      Additional Long Term Goals   Additional Long Term Goals Yes      PT LONG TERM GOAL #6   Title Pt will demonstrate ability to maintain SLB for at least 5 seconds on each LE in order to improve ability to safely navigate up/down curbs and over obstacles.    Baseline 6/27: Pt unable to maintain SLB on either LE    Time 12    Period Weeks    Status New    Target Date 08/18/21                   Plan - 06/03/21 0850     Clinical Impression Statement Pt able to be progressed on majority of static balance tasks to include vertical and horizontal head turns in order to challenge use of vestibular cues. Pt most challenged with RLE as primary stance leg during balance tasks. The pt will benefit from further skilled PT to improve both static and dynamic balance in order to decrease fall risk and increase QOL.    Personal Factors and Comorbidities Age;Fitness;Time since onset of injury/illness/exacerbation;Comorbidity 1;Comorbidity 2;Comorbidity 3+    Comorbidities pertinent per chart/pt: DMII, impaired sensation LEs, OA with hx of knee replacement, neuropathy, HTN    Examination-Activity Limitations Bathing;Carry;Lift;Stand;Locomotion Level;Transfers;Hygiene/Grooming;Reach Overhead;Stairs;Bend;Dressing;Squat    Examination-Participation Restrictions Community Activity;Shop;Volunteer;Cleaning;Yard  Work;Church;Laundry;Meal Prep    Stability/Clinical Decision Making Stable/Uncomplicated    Rehab Potential Good    PT Frequency 2x / week    PT Duration 12 weeks    PT Treatment/Interventions ADLs/Self Care Home Management;Moist Heat;Cryotherapy;DME Instruction;Gait training;Stair training;Functional mobility training;Therapeutic activities;Therapeutic exercise;Balance training;Neuromuscular re-education;Patient/family education;Orthotic Fit/Training;Manual techniques;Passive range of motion;Energy conservation;Splinting;Dry needling;Taping;Visual/perceptual remediation/compensation;Biofeedback    PT Next Visit Plan Continues with strengthening and gait based balance and motor control training, half foam under heels balance/coordination exercise, balance tasks that rely on vesetibular cues; balance tasks involving active knee flexion to ext., progress resisted PF exercises in sitting    PT Home Exercise Plan No updates today    Consulted and Agree with Plan of Care Patient             Patient will benefit from skilled therapeutic intervention in order to improve the following deficits and impairments:  Abnormal gait, Decreased activity tolerance, Improper body mechanics, Impaired sensation, Decreased balance, Decreased coordination, Decreased mobility, Difficulty walking, Impaired vision/preception, Postural dysfunction, Decreased strength, Decreased range of motion, Decreased endurance, Hypomobility, Impaired flexibility, Pain  Visit Diagnosis: Unsteadiness on feet  Muscle weakness (generalized)  Other abnormalities of gait and mobility  Other lack of coordination     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   Cellulitis 12/01/2018   Dysarthria 12/01/2018   HTN (hypertension) 12/01/2018   Other hyperlipidemia 12/01/2018   DNR (do not resuscitate) 03/07/2018   Urge incontinence of  urine 07/23/2017   Thoracic sprain 07/23/2017   Stye 07/23/2017   SOB (shortness of breath) 07/23/2017   Sleep apnea 07/23/2017   Shoulder strain 07/23/2017   Nephrolithiasis 07/23/2017   Neoplasm of uncertain behavior of skin 07/23/2017   Neck sprain 07/23/2017   Male  stress incontinence 07/23/2017   Leg swelling 07/23/2017   Ingrowing nail with infection 07/23/2017   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   DM (diabetes mellitus) ([place]) 07/23/2017   Bladder cancer ([place]) 07/23/2017   Benign essential hypertension 07/23/2017   Backache 07/23/2017   Cellulitis of great toe of left foot 07/23/2017   Seizures ([place]) 07/13/2017   CKD (chronic kidney disease) stage 3, GFR 30-59 ml/min (HCC) 07/06/2017   Type 2 diabetes mellitus with diabetic neuropathy, without long-term current use of insulin ([place]) 07/02/2017   Seizure disorder ([place]) 07/02/2017   Pure hypercholesterolemia 07/02/2017   Obesity (BMI 30.0-34.9) 07/02/2017   History of prostate cancer 07/02/2017   Coronary artery disease involving coronary bypass graft of native heart without angina pectoris 07/02/2017   Ricard Dillon PT, DPT 06/03/2021, 8:57 AM  [place] MAIN [facility] SERVICES [address] [place], [place], [address] Phone: [phone]   Fax:  [phone]  Name: TOBY BREITHAUPT MRN: [id number] Date of Birth: 07-03-1941

## 2021-06-05 ENCOUNTER — Other Ambulatory Visit: Payer: Self-pay

## 2021-06-05 ENCOUNTER — Ambulatory Visit: Payer: Medicare Other

## 2021-06-05 DIAGNOSIS — M6281 Muscle weakness (generalized): Secondary | ICD-10-CM

## 2021-06-05 DIAGNOSIS — R2681 Unsteadiness on feet: Secondary | ICD-10-CM

## 2021-06-05 DIAGNOSIS — R2689 Other abnormalities of gait and mobility: Secondary | ICD-10-CM

## 2021-06-05 NOTE — Therapy (Signed)
[place] MAIN [facility] SERVICES [address] [place], [place], [address] Phone: [phone]   Fax:  [phone]  Physical Therapy Treatment  Patient Details  Name: Elijah Oliver MRN: [id number] Date of Birth: 11-02-1941 Referring Provider (PT): Dion Body, MD   Encounter Date: 06/05/2021   PT End of Session - 06/05/21 0953     Visit Number 13    Number of Visits 25    Date for PT Re-Evaluation 07/16/21    Authorization Type Traditional Medicare A&B    Authorization Time Period eval performed 8/59/2924, cert for 4/62/8638    PT Start Time 0906    PT Stop Time 0950    PT Time Calculation (min) 44 min    Equipment Utilized During Treatment Gait belt    Activity Tolerance Patient tolerated treatment well    Behavior During Therapy [facility] for tasks assessed/performed             Past Medical History:  Diagnosis Date   Backache 07/23/2017   Benign essential hypertension 07/23/2017   Bladder cancer ([place]) 07/23/2017   Cellulitis of great toe of left foot 07/23/2017   CKD (chronic kidney disease) stage 3, GFR 30-59 ml/min ([place]) 07/06/2017   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   Overview:  Sequential left internal mammary to the mid LAD and distal LAD, SVG to the second obtuse marginal and SVG to the right coronary artery. June 24th, 2006 Dr. 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 07/23/2017   Neck sprain 07/23/2017   Neoplasm of uncertain behavior of skin 07/23/2017   Nephrolithiasis 07/23/2017   Pure hypercholesterolemia 07/02/2017   Seizure disorder ([place]) 07/02/2017   Shoulder strain 07/23/2017   Sleep apnea 07/23/2017   SOB (shortness of breath) 07/23/2017   Thoracic sprain 07/23/2017   Type 2 diabetes mellitus with diabetic neuropathy, without long-term current use of insulin ([place]) 07/02/2017   Urge incontinence of urine 07/23/2017    Past Surgical History:   Procedure Laterality Date   CARDIAC SURGERY  2006   PROSTATECTOMY  2004   REPLACEMENT TOTAL KNEE  2016   TRANSURETHRAL RESECTION OF BLADDER TUMOR WITH GYRUS (TURBT-GYRUS)  2010?   URETEROSCOPY WITH HOLMIUM LASER LITHOTRIPSY  2010?    There were no vitals filed for this visit.   Subjective Assessment - 06/05/21 0907     Subjective Pt reports no pain and no falls or near-falls since last visit. No other changes.    Pertinent History Pt is a pleasant 80 y/o male presenting to PT without an AD and with c/o impaired balance and gait disturbance that started over 4 years ago after his wife passed away from [place]. Per chart pt PMH includes: HTN, HLD, obesity, functional incontinence, pure hypercholesteroliemia, DMII, seizure disorder, CAD involving coronary bypass graft of native heart without angina pectoris (2006), hx of prostate CA, hx bladder CA, CKD, sleep apnea, SOB, knee replacement, per pt he has neuropathy in hands and feet    Currently in Pain? No/denies             TREATMENT  Therex:  Seated hamstring curls with BTB - 3x10 BLEs  Seated hamstring stretch - 2x30 sec BLEs   STS 2x10 focus on weight-shift onto mid-foot  Seated heel raises with 5# AWs on BLEs- 3x15    Standing quad stretch (chair-supported) - 30 sec each LE  Neuro Re-education - CGA provided for all  STS with half foam under feet with pt ankles in PF and DF; pt rates challenging - x multiple reps for each   In // bars: -Pt standing on half-foam, heels on foam biasing DF, WBOS, NBOS - 60 sec each -Pt standing on half-foam, toes on foam biasing DF, NBOS, EC - 60 sec -progressed exercise to pt performing with vertical and horizontal head-turns with EO x multiple times each direction each direction for each head-turn   Education provided throughout session primarily in the form of VC/TC/Demo in order to facilitate movement at target joints and correct muscle activation. Pt exhibits good carryover within  session after cuing.    PT Education - 06/05/21 [phone]     Education Details exercise technique, body mechanics with stretching exercises    Person(s) Educated Patient    Methods Explanation;Demonstration;Tactile cues;Verbal cues    Comprehension Verbalized understanding;Returned demonstration              PT Short Term Goals - 05/26/21 0804       PT SHORT TERM GOAL #1   Title Pt will be independent with HEP in order to improve strength and balance in order to decrease fall risk and improve function at home and work.    Baseline 5/25: to be initiated next session; 6/27: pt indep with HEP    Time 6    Period Weeks    Status Achieved    Target Date 06/04/21               PT Long Term Goals - 05/26/21 0806       PT LONG TERM GOAL #1   Title Patient will increase Berg Balance score by > 6 points to demonstrate decreased fall risk during functional activities.    Baseline 5/25: 34/56; 6/27: 44/56    Time 12    Period Weeks    Status Achieved    Target Date 07/16/21      PT LONG TERM GOAL #2   Title Pt will improve DGI by at least 3 points in order to demonstrate clinically significant improvement in balance and decreased risk for falls.    Baseline 5/25: 18/24; 6/27: 21/24    Time 12    Period Weeks    Status Achieved    Target Date 07/16/21      PT LONG TERM GOAL #3   Title Pt will improve ABC by at least 13% in order to demonstrate clinically significant improvement in balance confidence.    Baseline 5/25: 66.87; 6/27: 70.63%    Time 12    Period Weeks    Status On-going    Target Date 07/16/21      PT LONG TERM GOAL #4   Title Patient will increase 10 meter walk test to >1.68m/s as to improve gait speed for better community ambulation and to reduce fall risk.    Baseline 5/25: 0.86 m/s; 6/27: 0.97 m/s    Time 12    Period Weeks    Status Partially Met    Target Date 07/16/21      PT LONG TERM GOAL #5   Title Patient will increase FOTO score to equal to  or greater than 69  to demonstrate statistically significant improvement in mobility and quality of life.    Baseline 5/25: 59; 6/27: 57    Time 12    Period Weeks    Status On-going    Target Date 07/16/21      Additional Long Term Goals  Additional Long Term Goals Yes      PT LONG TERM GOAL #6   Title Pt will demonstrate ability to maintain SLB for at least 5 seconds on each LE in order to improve ability to safely navigate up/down curbs and over obstacles.    Baseline 6/27: Pt unable to maintain SLB on either LE    Time 12    Period Weeks    Status New    Target Date 08/18/21                   Plan - 06/05/21 0954     Clinical Impression Statement Pt continues to show improvement with balance tasks that require increased use of vestibular cues. Pt using intermittent UE support with most balance tasks. Pt also improved with his ability to resist posterior LOB with STS with ankles on half-foam in DF. The pt will continue to benefit from further skilled PT to improve balance and LE ROM/strength in order to increase safety with all functional mobility.    Personal Factors and Comorbidities Age;Fitness;Time since onset of injury/illness/exacerbation;Comorbidity 1;Comorbidity 2;Comorbidity 3+    Comorbidities pertinent per chart/pt: DMII, impaired sensation LEs, OA with hx of knee replacement, neuropathy, HTN    Examination-Activity Limitations Bathing;Carry;Lift;Stand;Locomotion Level;Transfers;Hygiene/Grooming;Reach Overhead;Stairs;Bend;Dressing;Squat    Examination-Participation Restrictions Community Activity;Shop;Volunteer;Cleaning;Yard Work;Church;Laundry;Meal Prep    Stability/Clinical Decision Making Stable/Uncomplicated    Rehab Potential Good    PT Frequency 2x / week    PT Duration 12 weeks    PT Treatment/Interventions ADLs/Self Care Home Management;Moist Heat;Cryotherapy;DME Instruction;Gait training;Stair training;Functional mobility training;Therapeutic  activities;Therapeutic exercise;Balance training;Neuromuscular re-education;Patient/family education;Orthotic Fit/Training;Manual techniques;Passive range of motion;Energy conservation;Splinting;Dry needling;Taping;Visual/perceptual remediation/compensation;Biofeedback    PT Next Visit Plan Continues with strengthening and gait based balance and motor control training, half foam under heels balance/coordination exercise, balance tasks that rely on vesetibular cues; balance tasks involving active knee flexion to ext., progress resisted PF exercises in sitting; continue POC as previously indicated    PT Home Exercise Plan No updates today    Consulted and Agree with Plan of Care Patient             Patient will benefit from skilled therapeutic intervention in order to improve the following deficits and impairments:  Abnormal gait, Decreased activity tolerance, Improper body mechanics, Impaired sensation, Decreased balance, Decreased coordination, Decreased mobility, Difficulty walking, Impaired vision/preception, Postural dysfunction, Decreased strength, Decreased range of motion, Decreased endurance, Hypomobility, Impaired flexibility, Pain  Visit Diagnosis: Unsteadiness on feet  Other abnormalities of gait and mobility  Muscle weakness (generalized)     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   Cellulitis 12/01/2018   Dysarthria 12/01/2018   HTN (hypertension) 12/01/2018   Other hyperlipidemia 12/01/2018   DNR (do not resuscitate) 03/07/2018   Urge incontinence of urine 07/23/2017   Thoracic sprain 07/23/2017   Stye 07/23/2017   SOB (shortness of breath) 07/23/2017   Sleep apnea 07/23/2017   Shoulder strain 07/23/2017   Nephrolithiasis 07/23/2017   Neoplasm of uncertain behavior of skin 07/23/2017   Neck sprain 07/23/2017   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 07/23/2017   Leg swelling  07/23/2017   Ingrowing nail with infection 07/23/2017   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   DM (diabetes mellitus) ([place]) 07/23/2017   Bladder cancer ([place]) 07/23/2017   Benign essential hypertension 07/23/2017   Backache 07/23/2017   Cellulitis of great toe of left foot 07/23/2017   Seizures ([place]) 07/13/2017  CKD (chronic kidney disease) stage 3, GFR 30-59 ml/min (HCC) 07/06/2017   Type 2 diabetes mellitus with diabetic neuropathy, without long-term current use of insulin ([place]) 07/02/2017   Seizure disorder ([place]) 07/02/2017   Pure hypercholesterolemia 07/02/2017   Obesity (BMI 30.0-34.9) 07/02/2017   History of prostate cancer 07/02/2017   Coronary artery disease involving coronary bypass graft of native heart without angina pectoris 07/02/2017   Ricard Dillon PT, DPT 06/05/2021, 10:00 AM  [place] MAIN [facility] SERVICES [place], [place], [address] Phone: [phone]   Fax:  [phone]  Name: Elijah Oliver MRN: [id number] Date of Birth: [birth year]

## 2021-06-09 ENCOUNTER — Other Ambulatory Visit: Payer: Self-pay

## 2021-06-09 ENCOUNTER — Ambulatory Visit: Payer: Medicare Other

## 2021-06-09 DIAGNOSIS — R278 Other lack of coordination: Secondary | ICD-10-CM

## 2021-06-09 DIAGNOSIS — R2681 Unsteadiness on feet: Secondary | ICD-10-CM | POA: Diagnosis not present

## 2021-06-09 DIAGNOSIS — M6281 Muscle weakness (generalized): Secondary | ICD-10-CM

## 2021-06-09 DIAGNOSIS — R2689 Other abnormalities of gait and mobility: Secondary | ICD-10-CM

## 2021-06-09 NOTE — Therapy (Signed)
[place] MAIN [facility] SERVICES [address] [place], [place], [address] Phone: [phone]   Fax:  [phone]  Physical Therapy Treatment  Patient Details  Name: Elijah Oliver MRN: [id number] Date of Birth: 07-Oct-1941 Referring Provider (PT): Dion Body, MD   Encounter Date: 06/09/2021   PT End of Session - 06/09/21 0853     Visit Number 14    Number of Visits 25    Date for PT Re-Evaluation 07/16/21    Authorization Type Traditional Medicare A&B    Authorization Time Period eval performed 1/69/6789, cert for 3/81/0175    PT Start Time 0804    PT Stop Time 0847    PT Time Calculation (min) 43 min    Equipment Utilized During Treatment Gait belt    Activity Tolerance Patient tolerated treatment well    Behavior During Therapy [facility] for tasks assessed/performed             Past Medical History:  Diagnosis Date   Backache 07/23/2017   Benign essential hypertension 07/23/2017   Bladder cancer ([place]) 07/23/2017   Cellulitis of great toe of left foot 07/23/2017   CKD (chronic kidney disease) stage 3, GFR 30-59 ml/min ([place]) 07/06/2017   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   Overview:  Sequential left internal mammary to the mid LAD and distal LAD, SVG to the second obtuse marginal and SVG to the right coronary artery. June 24th, 2006 Dr. 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 07/23/2017   Neck sprain 07/23/2017   Neoplasm of uncertain behavior of skin 07/23/2017   Nephrolithiasis 07/23/2017   Pure hypercholesterolemia 07/02/2017   Seizure disorder ([place]) 07/02/2017   Shoulder strain 07/23/2017   Sleep apnea 07/23/2017   SOB (shortness of breath) 07/23/2017   Thoracic sprain 07/23/2017   Type 2 diabetes mellitus with diabetic neuropathy, without long-term current use of insulin ([place]) 07/02/2017   Urge incontinence of urine 07/23/2017    Past Surgical History:   Procedure Laterality Date   CARDIAC SURGERY  2006   PROSTATECTOMY  2004   REPLACEMENT TOTAL KNEE  2016   TRANSURETHRAL RESECTION OF BLADDER TUMOR WITH GYRUS (TURBT-GYRUS)  2010?   URETEROSCOPY WITH HOLMIUM LASER LITHOTRIPSY  2010?    There were no vitals filed for this visit.   Subjective Assessment - 06/09/21 0803     Subjective Pt reports he felt "clumsy" this past weekend d/t neuropathy in his hands and that he was dropping things. He reports he feels good for the most part regarding walking balance, but that he struggles with quick turns. No other changes reported. No falls or LOB.    Pertinent History Pt is a pleasant 80 y/o male presenting to PT without an AD and with c/o impaired balance and gait disturbance that started over 4 years ago after his wife passed away from [place]. Per chart pt PMH includes: HTN, HLD, obesity, functional incontinence, pure hypercholesteroliemia, DMII, seizure disorder, CAD involving coronary bypass graft of native heart without angina pectoris (2006), hx of prostate CA, hx bladder CA, CKD, sleep apnea, SOB, knee replacement, per pt he has neuropathy in hands and feet    Currently in Pain? No/denies             TREATMENT   Neuro Re-education - CGA provided for all  Ankle pumps 20x each LE for motor control.coordination and warm-up d/t neuropathy  Alphabet 1 round each LE;  Cuing for movement at ankle; for motor control.coordination and warm-up d/t neuropathy   Wrist ext/flex and circles with hands-on assist from PT for motor control/coordination and sequencing - several reps  Resisted STS - 10x; some instances of pt using step-strategy and reduced eccentric control upon sitting. Pt rates medium-challenging. Improves postural stability with reps.   Standing at support surface-  -Pt standing on half-foam, heels on foam biasing DF, WBOS, NBOS - 60 sec each -Pt standing on half-foam, toes on foam biasing DF, NBOS, EC - 60 sec -Pt standing on  half-foam, toes on foam biasing PF, NBOS, EC - 60 sec- --progressed exercise to pt performing with vertical and horizontal head-turns with EO x multiple times each direction each direction for each head-turn  SLB with UUE support - 60 sec each LE  Modified SLB with one foot on airex and other foot on 6" step - 60 sec each LE  One foot on airex and one on 6" step with pt marching with LE on 6" step - x multiple reps each LE, requires UUE support.    Education provided throughout session primarily in the form of VC/TC/Demo in order to facilitate movement at target joints and correct muscle activation. Pt exhibits good carryover within session after cuing.     PT Education - 06/09/21 0852     Education Details exercise technique, body mechanics    Person(s) Educated Patient    Methods Explanation;Demonstration;Verbal cues    Comprehension Verbalized understanding;Returned demonstration              PT Short Term Goals - 05/26/21 0804       PT SHORT TERM GOAL #1   Title Pt will be independent with HEP in order to improve strength and balance in order to decrease fall risk and improve function at home and work.    Baseline 5/25: to be initiated next session; 6/27: pt indep with HEP    Time 6    Period Weeks    Status Achieved    Target Date 06/04/21               PT Long Term Goals - 05/26/21 0806       PT LONG TERM GOAL #1   Title Patient will increase Berg Balance score by > 6 points to demonstrate decreased fall risk during functional activities.    Baseline 5/25: 34/56; 6/27: 44/56    Time 12    Period Weeks    Status Achieved    Target Date 07/16/21      PT LONG TERM GOAL #2   Title Pt will improve DGI by at least 3 points in order to demonstrate clinically significant improvement in balance and decreased risk for falls.    Baseline 5/25: 18/24; 6/27: 21/24    Time 12    Period Weeks    Status Achieved    Target Date 07/16/21      PT LONG TERM GOAL #3    Title Pt will improve ABC by at least 13% in order to demonstrate clinically significant improvement in balance confidence.    Baseline 5/25: 66.87; 6/27: 70.63%    Time 12    Period Weeks    Status On-going    Target Date 07/16/21      PT LONG TERM GOAL #4   Title Patient will increase 10 meter walk test to >1.55ms as to improve gait speed for better community ambulation and to reduce fall risk.    Baseline  5/25: 0.86 m/s; 6/27: 0.97 m/s    Time 12    Period Weeks    Status Partially Met    Target Date 07/16/21      PT LONG TERM GOAL #5   Title Patient will increase FOTO score to equal to or greater than 69  to demonstrate statistically significant improvement in mobility and quality of life.    Baseline 5/25: 59; 6/27: 57    Time 12    Period Weeks    Status On-going    Target Date 07/16/21      Additional Long Term Goals   Additional Long Term Goals Yes      PT LONG TERM GOAL #6   Title Pt will demonstrate ability to maintain SLB for at least 5 seconds on each LE in order to improve ability to safely navigate up/down curbs and over obstacles.    Baseline 6/27: Pt unable to maintain SLB on either LE    Time 12    Period Weeks    Status New    Target Date 08/18/21                   Plan - 06/09/21 0854     Clinical Impression Statement Pt requires no UE support or assits from PT with DF/PF on half foam with WBOS and NBOS, and demonstrates very little increase in sway, indicating improved static balance. Pt still challenged when progressing to [facility] and with head-turns, requiring up to UUE support. The pt will continue to benefit from further skilled PT to improve balance, LE ROM and strength in order to decrease fall risk.    Personal Factors and Comorbidities Age;Fitness;Time since onset of injury/illness/exacerbation;Comorbidity 1;Comorbidity 2;Comorbidity 3+    Comorbidities pertinent per chart/pt: DMII, impaired sensation LEs, OA with hx of knee replacement,  neuropathy, HTN    Examination-Activity Limitations Bathing;Carry;Lift;Stand;Locomotion Level;Transfers;Hygiene/Grooming;Reach Overhead;Stairs;Bend;Dressing;Squat    Examination-Participation Restrictions Community Activity;Shop;Volunteer;Cleaning;Yard Work;Church;Laundry;Meal Prep    Stability/Clinical Decision Making Stable/Uncomplicated    Rehab Potential Good    PT Frequency 2x / week    PT Duration 12 weeks    PT Treatment/Interventions ADLs/Self Care Home Management;Moist Heat;Cryotherapy;DME Instruction;Gait training;Stair training;Functional mobility training;Therapeutic activities;Therapeutic exercise;Balance training;Neuromuscular re-education;Patient/family education;Orthotic Fit/Training;Manual techniques;Passive range of motion;Energy conservation;Splinting;Dry needling;Taping;Visual/perceptual remediation/compensation;Biofeedback    PT Next Visit Plan Continues with strengthening and gait based balance and motor control training, half foam under heels balance/coordination exercise, balance tasks that rely on vesetibular cues; balance tasks involving active knee flexion to ext., progress resisted PF exercises in sitting; dynamic balance with quick turns    PT Home Exercise Plan No updates today    Consulted and Agree with Plan of Care Patient             Patient will benefit from skilled therapeutic intervention in order to improve the following deficits and impairments:  Abnormal gait, Decreased activity tolerance, Improper body mechanics, Impaired sensation, Decreased balance, Decreased coordination, Decreased mobility, Difficulty walking, Impaired vision/preception, Postural dysfunction, Decreased strength, Decreased range of motion, Decreased endurance, Hypomobility, Impaired flexibility, Pain  Visit Diagnosis: Unsteadiness on feet  Other lack of coordination  Muscle weakness (generalized)  Other abnormalities of gait and mobility     Problem List Patient Active  Problem List   Diagnosis Date Noted   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   Cellulitis 12/01/2018   Dysarthria 12/01/2018   HTN (hypertension) 12/01/2018   Other hyperlipidemia 12/01/2018   DNR (do not resuscitate) 03/07/2018  Urge incontinence of urine 07/23/2017   Thoracic sprain 07/23/2017   Stye 07/23/2017   SOB (shortness of breath) 07/23/2017   Sleep apnea 07/23/2017   Shoulder strain 07/23/2017   Nephrolithiasis 07/23/2017   Neoplasm of uncertain behavior of skin 07/23/2017   Neck sprain 07/23/2017   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 07/23/2017   Leg swelling 07/23/2017   Ingrowing nail with infection 07/23/2017   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   DM (diabetes mellitus) ([place]) 07/23/2017   Bladder cancer ([place]) 07/23/2017   Benign essential hypertension 07/23/2017   Backache 07/23/2017   Cellulitis of great toe of left foot 07/23/2017   Seizures ([place]) 07/13/2017   CKD (chronic kidney disease) stage 3, GFR 30-59 ml/min (HCC) 07/06/2017   Type 2 diabetes mellitus with diabetic neuropathy, without long-term current use of insulin ([place]) 07/02/2017   Seizure disorder ([place]) 07/02/2017   Pure hypercholesterolemia 07/02/2017   Obesity (BMI 30.0-34.9) 07/02/2017   History of prostate cancer 07/02/2017   Coronary artery disease involving coronary bypass graft of native heart without angina pectoris 07/02/2017   Ricard Dillon PT, DPT  06/09/2021, 9:02 AM  [place] MAIN [facility] SERVICES [place], [place], [address] Phone: [phone]   Fax:  [phone]  Name: Elijah Oliver MRN: [id number] Date of Birth: 1941/11/03

## 2021-06-12 ENCOUNTER — Other Ambulatory Visit: Payer: Self-pay

## 2021-06-12 ENCOUNTER — Ambulatory Visit: Payer: Medicare Other

## 2021-06-12 DIAGNOSIS — R2681 Unsteadiness on feet: Secondary | ICD-10-CM

## 2021-06-12 DIAGNOSIS — R278 Other lack of coordination: Secondary | ICD-10-CM

## 2021-06-12 DIAGNOSIS — M6281 Muscle weakness (generalized): Secondary | ICD-10-CM

## 2021-06-12 DIAGNOSIS — R2689 Other abnormalities of gait and mobility: Secondary | ICD-10-CM

## 2021-06-12 NOTE — Therapy (Signed)
[place] MAIN [facility] SERVICES [address] [place], [place], [address] Phone: [phone]   Fax:  [phone]  Physical Therapy Treatment  Patient Details  Name: Elijah Oliver MRN: [id number] Date of Birth: Jan 21, 1941 Referring Provider (PT): Dion Body, MD   Encounter Date: 06/12/2021   PT End of Session - 06/12/21 1213     Visit Number 15    Number of Visits 25    Date for PT Re-Evaluation 07/16/21    Authorization Type Traditional Medicare A&B    Authorization Time Period eval performed 12/02/7251, cert for 6/64/4034    PT Start Time 0848    PT Stop Time 0929    PT Time Calculation (min) 41 min    Equipment Utilized During Treatment Gait belt    Activity Tolerance Patient tolerated treatment well    Behavior During Therapy [facility] for tasks assessed/performed             Past Medical History:  Diagnosis Date   Backache 07/23/2017   Benign essential hypertension 07/23/2017   Bladder cancer ([place]) 07/23/2017   Cellulitis of great toe of left foot 07/23/2017   CKD (chronic kidney disease) stage 3, GFR 30-59 ml/min ([place]) 07/06/2017   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   Overview:  Sequential left internal mammary to the mid LAD and distal LAD, SVG to the second obtuse marginal and SVG to the right coronary artery. June 24th, 2006 Dr. 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 07/23/2017   Neck sprain 07/23/2017   Neoplasm of uncertain behavior of skin 07/23/2017   Nephrolithiasis 07/23/2017   Pure hypercholesterolemia 07/02/2017   Seizure disorder ([place]) 07/02/2017   Shoulder strain 07/23/2017   Sleep apnea 07/23/2017   SOB (shortness of breath) 07/23/2017   Thoracic sprain 07/23/2017   Type 2 diabetes mellitus with diabetic neuropathy, without long-term current use of insulin ([place]) 07/02/2017   Urge incontinence of urine 07/23/2017    Past Surgical History:   Procedure Laterality Date   CARDIAC SURGERY  2006   PROSTATECTOMY  2004   REPLACEMENT TOTAL KNEE  2016   TRANSURETHRAL RESECTION OF BLADDER TUMOR WITH GYRUS (TURBT-GYRUS)  2010?   URETEROSCOPY WITH HOLMIUM LASER LITHOTRIPSY  2010?    There were no vitals filed for this visit.   Subjective Assessment - 06/12/21 0900     Subjective Pt reports no pain today. No other changes since last visit.    Pertinent History Pt is a pleasant 80 y/o male presenting to PT without an AD and with c/o impaired balance and gait disturbance that started over 4 years ago after his wife passed away from [place]. Per chart pt PMH includes: HTN, HLD, obesity, functional incontinence, pure hypercholesteroliemia, DMII, seizure disorder, CAD involving coronary bypass graft of native heart without angina pectoris (2006), hx of prostate CA, hx bladder CA, CKD, sleep apnea, SOB, knee replacement, per pt he has neuropathy in hands and feet    Limitations Standing;Walking;Lifting;House hold activities;Writing   writing affected by neuropathy in hands   How long can you sit comfortably? not affected    How long can you stand comfortably? a couple minutes, must hold onto something    How long can you walk comfortably? avoids walking around people, afraid of being bumped into and falling    Diagnostic tests pt reports no recent diagnostic testing, had knee surgery in 2016    Patient Stated Goals  Wants to be able to stand without feeling "wobbly" and to improve his walking.    Currently in Pain? No/denies           TREATMENT   Neuro Re-education - CGA provided for all  NBOS firm surface, EO - 30 sec. No decreases in postural stability.   Semi-tandem 2x30 sec BLEs; more difficulty with RLE, however, no LOB, no use of intermittent UE support.   ABCs with foot/ankle each LE with completing each letter with 3 sec count.   Ankle pumps 20x.  Tandem stance 2x30 sec BLEs; intermittent UE support.   Split stance (SLB  progression) with one foot on floor and other on 6" step - 2x30 sec each LE; more diffficulty on RLE.  RTB ankle adduction 1x15 RLE, progressed to BTB ankle adduction 2x15 RLE and 3x15 LLE   Matrix cable machine, seated hamstring curls initial reps for technique at 2.5#, with VC/TC from PT for technique Progressed to 12.5# for 2x6 reps each LE. Pt rates as difficult.     Education provided throughout session primarily in the form of VC/TC/Demo in order to facilitate movement at target joints and correct muscle activation. Pt exhibits good carryover within session after cuing.    PT Education - 06/12/21 1213     Education Details exercise technique, body mechanics with matrix cable machine LE exercise    Person(s) Educated Patient    Methods Explanation;Demonstration;Verbal cues;Tactile cues    Comprehension Verbalized understanding;Returned demonstration              PT Short Term Goals - 05/26/21 0804       PT SHORT TERM GOAL #1   Title Pt will be independent with HEP in order to improve strength and balance in order to decrease fall risk and improve function at home and work.    Baseline 5/25: to be initiated next session; 6/27: pt indep with HEP    Time 6    Period Weeks    Status Achieved    Target Date 06/04/21               PT Long Term Goals - 05/26/21 0806       PT LONG TERM GOAL #1   Title Patient will increase Berg Balance score by > 6 points to demonstrate decreased fall risk during functional activities.    Baseline 5/25: 34/56; 6/27: 44/56    Time 12    Period Weeks    Status Achieved    Target Date 07/16/21      PT LONG TERM GOAL #2   Title Pt will improve DGI by at least 3 points in order to demonstrate clinically significant improvement in balance and decreased risk for falls.    Baseline 5/25: 18/24; 6/27: 21/24    Time 12    Period Weeks    Status Achieved    Target Date 07/16/21      PT LONG TERM GOAL #3   Title Pt will improve ABC by  at least 13% in order to demonstrate clinically significant improvement in balance confidence.    Baseline 5/25: 66.87; 6/27: 70.63%    Time 12    Period Weeks    Status On-going    Target Date 07/16/21      PT LONG TERM GOAL #4   Title Patient will increase 10 meter walk test to >1.78m/s as to improve gait speed for better community ambulation and to reduce fall risk.    Baseline 5/25: 0.86  m/s; 6/27: 0.97 m/s    Time 12    Period Weeks    Status Partially Met    Target Date 07/16/21      PT LONG TERM GOAL #5   Title Patient will increase FOTO score to equal to or greater than 69  to demonstrate statistically significant improvement in mobility and quality of life.    Baseline 5/25: 59; 6/27: 57    Time 12    Period Weeks    Status On-going    Target Date 07/16/21      Additional Long Term Goals   Additional Long Term Goals Yes      PT LONG TERM GOAL #6   Title Pt will demonstrate ability to maintain SLB for at least 5 seconds on each LE in order to improve ability to safely navigate up/down curbs and over obstacles.    Baseline 6/27: Pt unable to maintain SLB on either LE    Time 12    Period Weeks    Status New    Target Date 08/18/21                   Plan - 06/12/21 1217     Clinical Impression Statement Pt progressed to matrix cable machine for LE strengthening this session. Pt also able to perform semi-tandem balance exercise without UE support and no decrease in postural stability, indicating continued improvement in static balance. Although pt demonstrates improvement, he does continue to have greater difficulty with RLE as primary stance leg during balance tasks, particularly with SLB progression. The pt will continue to benefit from further skilled PT to improve LE strength, balance and general LE ROM in order to improve balance and to decrease fall risk.    Personal Factors and Comorbidities Age;Fitness;Time since onset of  injury/illness/exacerbation;Comorbidity 1;Comorbidity 2;Comorbidity 3+    Comorbidities pertinent per chart/pt: DMII, impaired sensation LEs, OA with hx of knee replacement, neuropathy, HTN    Examination-Activity Limitations Bathing;Carry;Lift;Stand;Locomotion Level;Transfers;Hygiene/Grooming;Reach Overhead;Stairs;Bend;Dressing;Squat    Examination-Participation Restrictions Community Activity;Shop;Volunteer;Cleaning;Yard Work;Church;Laundry;Meal Prep    Stability/Clinical Decision Making Stable/Uncomplicated    Rehab Potential Good    PT Frequency 2x / week    PT Duration 12 weeks    PT Treatment/Interventions ADLs/Self Care Home Management;Moist Heat;Cryotherapy;DME Instruction;Gait training;Stair training;Functional mobility training;Therapeutic activities;Therapeutic exercise;Balance training;Neuromuscular re-education;Patient/family education;Orthotic Fit/Training;Manual techniques;Passive range of motion;Energy conservation;Splinting;Dry needling;Taping;Visual/perceptual remediation/compensation;Biofeedback    PT Next Visit Plan Continues with strengthening and gait based balance and motor control training, half foam under heels balance/coordination exercise, balance tasks that rely on vesetibular cues; balance tasks involving active knee flexion to ext., progress resisted PF exercises in sitting; dynamic balance with quick turns, continue POC as previously indicated    PT Home Exercise Plan No updates today    Consulted and Agree with Plan of Care Patient             Patient will benefit from skilled therapeutic intervention in order to improve the following deficits and impairments:  Abnormal gait, Decreased activity tolerance, Improper body mechanics, Impaired sensation, Decreased balance, Decreased coordination, Decreased mobility, Difficulty walking, Impaired vision/preception, Postural dysfunction, Decreased strength, Decreased range of motion, Decreased endurance, Hypomobility,  Impaired flexibility, Pain  Visit Diagnosis: Unsteadiness on feet  Other abnormalities of gait and mobility  Muscle weakness (generalized)  Other lack of coordination     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   Cellulitis 12/01/2018  Dysarthria 12/01/2018   HTN (hypertension) 12/01/2018   Other hyperlipidemia 12/01/2018   DNR (do not resuscitate) 03/07/2018   Urge incontinence of urine 07/23/2017   Thoracic sprain 07/23/2017   Stye 07/23/2017   SOB (shortness of breath) 07/23/2017   Sleep apnea 07/23/2017   Shoulder strain 07/23/2017   Nephrolithiasis 07/23/2017   Neoplasm of uncertain behavior of skin 07/23/2017   Neck sprain 07/23/2017   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 07/23/2017   Leg swelling 07/23/2017   Ingrowing nail with infection 07/23/2017   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   DM (diabetes mellitus) ([place]) 07/23/2017   Bladder cancer ([place]) 07/23/2017   Benign essential hypertension 07/23/2017   Backache 07/23/2017   Cellulitis of great toe of left foot 07/23/2017   Seizures ([place]) 07/13/2017   CKD (chronic kidney disease) stage 3, GFR 30-59 ml/min (HCC) 07/06/2017   Type 2 diabetes mellitus with diabetic neuropathy, without long-term current use of insulin ([place]) 07/02/2017   Seizure disorder ([place]) 07/02/2017   Pure hypercholesterolemia 07/02/2017   Obesity (BMI 30.0-34.9) 07/02/2017   History of prostate cancer 07/02/2017   Coronary artery disease involving coronary bypass graft of native heart without angina pectoris 07/02/2017   Ricard Dillon PT, DPT Zollie Pee 06/12/2021, 12:23 PM  [place] MAIN [facility] SERVICES [address] [place], [place], [address] Phone: [phone]   Fax:  [phone]  Name: SASCHA BAUGHER MRN: [id number] Date of Birth: July 25, 1941

## 2021-06-16 ENCOUNTER — Ambulatory Visit: Payer: Medicare Other

## 2021-06-16 ENCOUNTER — Other Ambulatory Visit: Payer: Self-pay

## 2021-06-16 DIAGNOSIS — R2681 Unsteadiness on feet: Secondary | ICD-10-CM | POA: Diagnosis not present

## 2021-06-16 DIAGNOSIS — R278 Other lack of coordination: Secondary | ICD-10-CM

## 2021-06-16 DIAGNOSIS — R2689 Other abnormalities of gait and mobility: Secondary | ICD-10-CM

## 2021-06-16 DIAGNOSIS — M6281 Muscle weakness (generalized): Secondary | ICD-10-CM

## 2021-06-16 NOTE — Therapy (Signed)
[place] MAIN [facility] SERVICES [address] [place], [place], [address] Phone: [phone]   Fax:  [phone]  Physical Therapy Treatment  Patient Details  Name: Elijah Oliver MRN: [id number] Date of Birth: 03/31/1941 Referring Provider (PT): Dion Body, MD   Encounter Date: 06/16/2021   PT End of Session - 06/16/21 0934     Visit Number 16    Number of Visits 25    Date for PT Re-Evaluation 07/16/21    Authorization Type Traditional Medicare A&B    Authorization Time Period eval performed 8/34/1962, cert for 2/29/7989    PT Start Time 0803    PT Stop Time 0844    PT Time Calculation (min) 41 min    Equipment Utilized During Treatment Gait belt    Activity Tolerance Patient tolerated treatment well    Behavior During Therapy [facility] for tasks assessed/performed             Past Medical History:  Diagnosis Date   Backache 07/23/2017   Benign essential hypertension 07/23/2017   Bladder cancer ([place]) 07/23/2017   Cellulitis of great toe of left foot 07/23/2017   CKD (chronic kidney disease) stage 3, GFR 30-59 ml/min ([place]) 07/06/2017   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   Overview:  Sequential left internal mammary to the mid LAD and distal LAD, SVG to the second obtuse marginal and SVG to the right coronary artery. June 24th, 2006 Dr. 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 07/23/2017   Neck sprain 07/23/2017   Neoplasm of uncertain behavior of skin 07/23/2017   Nephrolithiasis 07/23/2017   Pure hypercholesterolemia 07/02/2017   Seizure disorder ([place]) 07/02/2017   Shoulder strain 07/23/2017   Sleep apnea 07/23/2017   SOB (shortness of breath) 07/23/2017   Thoracic sprain 07/23/2017   Type 2 diabetes mellitus with diabetic neuropathy, without long-term current use of insulin ([place]) 07/02/2017   Urge incontinence of urine 07/23/2017    Past Surgical History:   Procedure Laterality Date   CARDIAC SURGERY  2006   PROSTATECTOMY  2004   REPLACEMENT TOTAL KNEE  2016   TRANSURETHRAL RESECTION OF BLADDER TUMOR WITH GYRUS (TURBT-GYRUS)  2010?   URETEROSCOPY WITH HOLMIUM LASER LITHOTRIPSY  2010?    There were no vitals filed for this visit.   Subjective Assessment - 06/16/21 0804     Subjective Pt reports increased knee stiffness with prolonged standing.    Pertinent History Pt is a pleasant 80 y/o male presenting to PT without an AD and with c/o impaired balance and gait disturbance that started over 4 years ago after his wife passed away from [place]. Per chart pt PMH includes: HTN, HLD, obesity, functional incontinence, pure hypercholesteroliemia, DMII, seizure disorder, CAD involving coronary bypass graft of native heart without angina pectoris (2006), hx of prostate CA, hx bladder CA, CKD, sleep apnea, SOB, knee replacement, per pt he has neuropathy in hands and feet    Limitations Standing;Walking;Lifting;House hold activities;Writing   writing affected by neuropathy in hands   How long can you sit comfortably? not affected    How long can you stand comfortably? a couple minutes, must hold onto something    How long can you walk comfortably? avoids walking around people, afraid of being bumped into and falling    Diagnostic tests pt reports no recent diagnostic testing, had knee surgery in 2016    Patient Stated Goals Wants to be  able to stand without feeling "wobbly" and to improve his walking.    Currently in Pain? No/denies            TREATMENT  Therex-  Standing DF - 2x20 ABCs - 1 round each LE Mini-squats - 3x10 Standing hamstring curls without 20x Standing hamstring curls with 5# AW - 4x10 Seated LAQ with 5# AW - 20x each LE Standing hip abd with 5# AW - 3x10 Seated hamstring stretch - 60 sec BLEs  Standing chair-supported quad stretch - 60 sec BLEs Nustep level 2 - x 5 min. Min assist throughout for LE positioning and mount/dismount  d/t pt neuropathy. Pt maintains SPM 80s-100s. Seated marches with 5# AW - 2x20   Education provided throughout session primarily in the form of VC/TC/Demo in order to facilitate movement at target joints and correct muscle activation. Pt exhibits good carryover within session after cuing.    PT Education - 06/16/21 0934     Education Details exercise technique, body mechanics    Person(s) Educated Patient    Methods Explanation;Demonstration;Verbal cues;Tactile cues    Comprehension Verbalized understanding;Returned demonstration              PT Short Term Goals - 05/26/21 0804       PT SHORT TERM GOAL #1   Title Pt will be independent with HEP in order to improve strength and balance in order to decrease fall risk and improve function at home and work.    Baseline 5/25: to be initiated next session; 6/27: pt indep with HEP    Time 6    Period Weeks    Status Achieved    Target Date 06/04/21               PT Long Term Goals - 05/26/21 0806       PT LONG TERM GOAL #1   Title Patient will increase Berg Balance score by > 6 points to demonstrate decreased fall risk during functional activities.    Baseline 5/25: 34/56; 6/27: 44/56    Time 12    Period Weeks    Status Achieved    Target Date 07/16/21      PT LONG TERM GOAL #2   Title Pt will improve DGI by at least 3 points in order to demonstrate clinically significant improvement in balance and decreased risk for falls.    Baseline 5/25: 18/24; 6/27: 21/24    Time 12    Period Weeks    Status Achieved    Target Date 07/16/21      PT LONG TERM GOAL #3   Title Pt will improve ABC by at least 13% in order to demonstrate clinically significant improvement in balance confidence.    Baseline 5/25: 66.87; 6/27: 70.63%    Time 12    Period Weeks    Status On-going    Target Date 07/16/21      PT LONG TERM GOAL #4   Title Patient will increase 10 meter walk test to >1.41ms as to improve gait speed for better  community ambulation and to reduce fall risk.    Baseline 5/25: 0.86 m/s; 6/27: 0.97 m/s    Time 12    Period Weeks    Status Partially Met    Target Date 07/16/21      PT LONG TERM GOAL #5   Title Patient will increase FOTO score to equal to or greater than 69  to demonstrate statistically significant improvement in mobility and quality of  life.    Baseline 5/25: 59; 6/27: 57    Time 12    Period Weeks    Status On-going    Target Date 07/16/21      Additional Long Term Goals   Additional Long Term Goals Yes      PT LONG TERM GOAL #6   Title Pt will demonstrate ability to maintain SLB for at least 5 seconds on each LE in order to improve ability to safely navigate up/down curbs and over obstacles.    Baseline 6/27: Pt unable to maintain SLB on either LE    Time 12    Period Weeks    Status New    Target Date 08/18/21               Plan - 06/16/21 0934     Clinical Impression Statement Focus on LE strengthening, endurance, and stretching to address LE stiffness. Pt instructed in warming-up with ABCs, AROM exercises followed by strengthening and stretching. Pt demos good technique throughout. The pt will benefit from further skilled therapy to address balance, general LE strength and endurance and ROM in order to dcrease fall risk.    Personal Factors and Comorbidities Age;Fitness;Time since onset of injury/illness/exacerbation;Comorbidity 1;Comorbidity 2;Comorbidity 3+    Comorbidities pertinent per chart/pt: DMII, impaired sensation LEs, OA with hx of knee replacement, neuropathy, HTN    Examination-Activity Limitations Bathing;Carry;Lift;Stand;Locomotion Level;Transfers;Hygiene/Grooming;Reach Overhead;Stairs;Bend;Dressing;Squat    Examination-Participation Restrictions Community Activity;Shop;Volunteer;Cleaning;Yard Work;Church;Laundry;Meal Prep    Stability/Clinical Decision Making Stable/Uncomplicated    Rehab Potential Good    PT Frequency 2x / week    PT Duration 12  weeks    PT Treatment/Interventions ADLs/Self Care Home Management;Moist Heat;Cryotherapy;DME Instruction;Gait training;Stair training;Functional mobility training;Therapeutic activities;Therapeutic exercise;Balance training;Neuromuscular re-education;Patient/family education;Orthotic Fit/Training;Manual techniques;Passive range of motion;Energy conservation;Splinting;Dry needling;Taping;Visual/perceptual remediation/compensation;Biofeedback    PT Next Visit Plan Continues with strengthening and gait based balance and motor control training, half foam under heels balance/coordination exercise, balance tasks that rely on vesetibular cues; balance tasks involving active knee flexion to ext., progress resisted PF exercises in sitting; dynamic balance with quick turns, continue POC as previously indicated    PT Home Exercise Plan No updates today    Consulted and Agree with Plan of Care Patient             Patient will benefit from skilled therapeutic intervention in order to improve the following deficits and impairments:  Abnormal gait, Decreased activity tolerance, Improper body mechanics, Impaired sensation, Decreased balance, Decreased coordination, Decreased mobility, Difficulty walking, Impaired vision/preception, Postural dysfunction, Decreased strength, Decreased range of motion, Decreased endurance, Hypomobility, Impaired flexibility, Pain  Visit Diagnosis: Other abnormalities of gait and mobility  Unsteadiness on feet  Muscle weakness (generalized)  Other lack of coordination     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   Cellulitis 12/01/2018   Dysarthria 12/01/2018   HTN (hypertension) 12/01/2018   Other hyperlipidemia 12/01/2018   DNR (do not resuscitate) 03/07/2018   Urge incontinence of urine 07/23/2017   Thoracic sprain 07/23/2017   Stye 07/23/2017   SOB (shortness of  breath) 07/23/2017   Sleep apnea 07/23/2017   Shoulder strain 07/23/2017   Nephrolithiasis 07/23/2017   Neoplasm of uncertain behavior of skin 07/23/2017   Neck sprain 07/23/2017   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 07/23/2017   Leg swelling 07/23/2017   Ingrowing nail with infection 07/23/2017   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   DM (diabetes mellitus) (  [place]) 07/23/2017   Bladder cancer ([place]) 07/23/2017   Benign essential hypertension 07/23/2017   Backache 07/23/2017   Cellulitis of great toe of left foot 07/23/2017   Seizures ([place]) 07/13/2017   CKD (chronic kidney disease) stage 3, GFR 30-59 ml/min (HCC) 07/06/2017   Type 2 diabetes mellitus with diabetic neuropathy, without long-term current use of insulin ([place]) 07/02/2017   Seizure disorder ([place]) 07/02/2017   Pure hypercholesterolemia 07/02/2017   Obesity (BMI 30.0-34.9) 07/02/2017   History of prostate cancer 07/02/2017   Coronary artery disease involving coronary bypass graft of native heart without angina pectoris 07/02/2017   Ricard Dillon PT, DPT 06/16/2021, 9:36 AM  [place] MAIN [facility] SERVICES [address] [place], [place], [address] Phone: [phone]   Fax:  [phone]  Name: DARELLE KINGS MRN: [id number] Date of Birth: 08/30/1941

## 2021-06-19 ENCOUNTER — Ambulatory Visit: Payer: Medicare Other

## 2021-06-19 ENCOUNTER — Other Ambulatory Visit: Payer: Self-pay

## 2021-06-19 DIAGNOSIS — R2681 Unsteadiness on feet: Secondary | ICD-10-CM | POA: Diagnosis not present

## 2021-06-19 DIAGNOSIS — R278 Other lack of coordination: Secondary | ICD-10-CM

## 2021-06-19 DIAGNOSIS — M6281 Muscle weakness (generalized): Secondary | ICD-10-CM

## 2021-06-19 DIAGNOSIS — R2689 Other abnormalities of gait and mobility: Secondary | ICD-10-CM

## 2021-06-19 NOTE — Therapy (Signed)
[place] MAIN [facility] SERVICES [address] [place], [place], [address] Phone: [phone]   Fax:  [phone]  Physical Therapy Treatment  Patient Details  Name: Elijah Oliver MRN: [id number] Date of Birth: 09-May-1941 Referring Provider (PT): Dion Body, MD   Encounter Date: 06/19/2021   PT End of Session - 06/19/21 1507     Visit Number 17    Number of Visits 25    Date for PT Re-Evaluation 07/16/21    Authorization Type Traditional Medicare A&B    Authorization Time Period eval performed 6/96/2952, cert for 8/41/3244    PT Start Time 0845    PT Stop Time 0928    PT Time Calculation (min) 43 min    Equipment Utilized During Treatment Gait belt    Activity Tolerance Patient tolerated treatment well    Behavior During Therapy [facility] for tasks assessed/performed             Past Medical History:  Diagnosis Date   Backache 07/23/2017   Benign essential hypertension 07/23/2017   Bladder cancer ([place]) 07/23/2017   Cellulitis of great toe of left foot 07/23/2017   CKD (chronic kidney disease) stage 3, GFR 30-59 ml/min ([place]) 07/06/2017   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   Overview:  Sequential left internal mammary to the mid LAD and distal LAD, SVG to the second obtuse marginal and SVG to the right coronary artery. June 24th, 2006 Dr. 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 07/23/2017   Neck sprain 07/23/2017   Neoplasm of uncertain behavior of skin 07/23/2017   Nephrolithiasis 07/23/2017   Pure hypercholesterolemia 07/02/2017   Seizure disorder ([place]) 07/02/2017   Shoulder strain 07/23/2017   Sleep apnea 07/23/2017   SOB (shortness of breath) 07/23/2017   Thoracic sprain 07/23/2017   Type 2 diabetes mellitus with diabetic neuropathy, without long-term current use of insulin ([place]) 07/02/2017   Urge incontinence of urine 07/23/2017    Past Surgical History:   Procedure Laterality Date   CARDIAC SURGERY  2006   PROSTATECTOMY  2004   REPLACEMENT TOTAL KNEE  2016   TRANSURETHRAL RESECTION OF BLADDER TUMOR WITH GYRUS (TURBT-GYRUS)  2010?   URETEROSCOPY WITH HOLMIUM LASER LITHOTRIPSY  2010?    There were no vitals filed for this visit.   Subjective Assessment - 06/19/21 0846     Subjective Pt reports he went to the grocery store yesterday, he says it makes his legs hurt to walk for long periods of time. He gets in and out as soon as he can.    Pertinent History Pt is a pleasant 80 y/o male presenting to PT without an AD and with c/o impaired balance and gait disturbance that started over 4 years ago after his wife passed away from [place]. Per chart pt PMH includes: HTN, HLD, obesity, functional incontinence, pure hypercholesteroliemia, DMII, seizure disorder, CAD involving coronary bypass graft of native heart without angina pectoris (2006), hx of prostate CA, hx bladder CA, CKD, sleep apnea, SOB, knee replacement, per pt he has neuropathy in hands and feet    Limitations Standing;Walking;Lifting;House hold activities;Writing   writing affected by neuropathy in hands   How long can you sit comfortably? not affected    How long can you stand comfortably? a couple minutes, must hold onto something    How long can you walk comfortably? avoids walking around people, afraid of being bumped into and falling  Diagnostic tests pt reports no recent diagnostic testing, had knee surgery in 2016    Patient Stated Goals Wants to be able to stand without feeling "wobbly" and to improve his walking.    Currently in Pain? No/denies              TREATMENT  Therex-   Ankle pumps 20x each LE ABCs - 1 round each LE Nustep level 3 - x 5 min. SPM 50s-100s. (Unbilled) Seated hamstring curls at Matrix cable machine 2x10 reps each LE  PT instructs pt in maintaining overall fitness level, particularly with performing cardioresp. And LE endurance exercises on  machines like the nustep in order to decrease stiffness felt in BLEs. Pt verbalizes understanding.  Neuro Re-education - CGA provided for all   In // bars- Standing on half-foam: Pt first performs 30 seconds of EO DF and PF on half-foam Pt then progresses to EO, DF, position with vertical head turns  EO, DF, with horizontal head turns EO, DF, naming animals, with vertical head turns, EO, DF, naming foods, with horizontal head turns EO, PF, naming animals with vertical head turns EO, PF, naming foods with horizontal head turns - multiple reps for each. Pt utilizes intermittent UE support. Challenged with dual task.  SLB 45 sec each LE, use of intermittent UE support    PT Education - 06/19/21 1507     Education Details exercise technique, body mechanics    Person(s) Educated Patient    Methods Explanation;Demonstration;Verbal cues    Comprehension Verbalized understanding;Returned demonstration              PT Short Term Goals - 05/26/21 0804       PT SHORT TERM GOAL #1   Title Pt will be independent with HEP in order to improve strength and balance in order to decrease fall risk and improve function at home and work.    Baseline 5/25: to be initiated next session; 6/27: pt indep with HEP    Time 6    Period Weeks    Status Achieved    Target Date 06/04/21               PT Long Term Goals - 05/26/21 0806       PT LONG TERM GOAL #1   Title Patient will increase Berg Balance score by > 6 points to demonstrate decreased fall risk during functional activities.    Baseline 5/25: 34/56; 6/27: 44/56    Time 12    Period Weeks    Status Achieved    Target Date 07/16/21      PT LONG TERM GOAL #2   Title Pt will improve DGI by at least 3 points in order to demonstrate clinically significant improvement in balance and decreased risk for falls.    Baseline 5/25: 18/24; 6/27: 21/24    Time 12    Period Weeks    Status Achieved    Target Date 07/16/21      PT  LONG TERM GOAL #3   Title Pt will improve ABC by at least 13% in order to demonstrate clinically significant improvement in balance confidence.    Baseline 5/25: 66.87; 6/27: 70.63%    Time 12    Period Weeks    Status On-going    Target Date 07/16/21      PT LONG TERM GOAL #4   Title Patient will increase 10 meter walk test to >1.5m/s as to improve gait speed for better community ambulation and  to reduce fall risk.    Baseline 5/25: 0.86 m/s; 6/27: 0.97 m/s    Time 12    Period Weeks    Status Partially Met    Target Date 07/16/21      PT LONG TERM GOAL #5   Title Patient will increase FOTO score to equal to or greater than 69  to demonstrate statistically significant improvement in mobility and quality of life.    Baseline 5/25: 59; 6/27: 57    Time 12    Period Weeks    Status On-going    Target Date 07/16/21      Additional Long Term Goals   Additional Long Term Goals Yes      PT LONG TERM GOAL #6   Title Pt will demonstrate ability to maintain SLB for at least 5 seconds on each LE in order to improve ability to safely navigate up/down curbs and over obstacles.    Baseline 6/27: Pt unable to maintain SLB on either LE    Time 12    Period Weeks    Status New    Target Date 08/18/21                   Plan - 06/19/21 1625     Clinical Impression Statement Pt able to progress with balance exercise by incorporating dual task, indicating overall improvement with static balance. PT provided education as well this session regarding pt maintaining overall fitness by incorporting exercises like the nustep (seated cardio/mm endurance) into his routine in order to improve LE stiffness. Pt verbalized understanding. The pt will benefit from further skilled PT to continue to improve balance, LE strength, and mobility to decrease fall risk and improve QOL.    Personal Factors and Comorbidities Age;Fitness;Time since onset of injury/illness/exacerbation;Comorbidity 1;Comorbidity  2;Comorbidity 3+    Comorbidities pertinent per chart/pt: DMII, impaired sensation LEs, OA with hx of knee replacement, neuropathy, HTN    Examination-Activity Limitations Bathing;Carry;Lift;Stand;Locomotion Level;Transfers;Hygiene/Grooming;Reach Overhead;Stairs;Bend;Dressing;Squat    Examination-Participation Restrictions Community Activity;Shop;Volunteer;Cleaning;Yard Work;Church;Laundry;Meal Prep    Stability/Clinical Decision Making Stable/Uncomplicated    Rehab Potential Good    PT Frequency 2x / week    PT Duration 12 weeks    PT Treatment/Interventions ADLs/Self Care Home Management;Moist Heat;Cryotherapy;DME Instruction;Gait training;Stair training;Functional mobility training;Therapeutic activities;Therapeutic exercise;Balance training;Neuromuscular re-education;Patient/family education;Orthotic Fit/Training;Manual techniques;Passive range of motion;Energy conservation;Splinting;Dry needling;Taping;Visual/perceptual remediation/compensation;Biofeedback    PT Next Visit Plan Continues with strengthening and gait based balance and motor control training, half foam under heels balance/coordination exercise, balance tasks that rely on vesetibular cues; balance tasks involving active knee flexion to ext., progress resisted PF exercises in sitting; dynamic balance with quick turns, continue POC as previously indicated    PT Home Exercise Plan No updates today    Consulted and Agree with Plan of Care Patient             Patient will benefit from skilled therapeutic intervention in order to improve the following deficits and impairments:  Abnormal gait, Decreased activity tolerance, Improper body mechanics, Impaired sensation, Decreased balance, Decreased coordination, Decreased mobility, Difficulty walking, Impaired vision/preception, Postural dysfunction, Decreased strength, Decreased range of motion, Decreased endurance, Hypomobility, Impaired flexibility, Pain  Visit  Diagnosis: Unsteadiness on feet  Other abnormalities of gait and mobility  Other lack of coordination  Muscle weakness (generalized)     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   Cellulitis 12/01/2018   Dysarthria  12/01/2018   HTN (hypertension) 12/01/2018   Other hyperlipidemia 12/01/2018   DNR (do not resuscitate) 03/07/2018   Urge incontinence of urine 07/23/2017   Thoracic sprain 07/23/2017   Stye 07/23/2017   SOB (shortness of breath) 07/23/2017   Sleep apnea 07/23/2017   Shoulder strain 07/23/2017   Nephrolithiasis 07/23/2017   Neoplasm of uncertain behavior of skin 07/23/2017   Neck sprain 07/23/2017   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 07/23/2017   Leg swelling 07/23/2017   Ingrowing nail with infection 07/23/2017   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   DM (diabetes mellitus) ([place]) 07/23/2017   Bladder cancer ([place]) 07/23/2017   Benign essential hypertension 07/23/2017   Backache 07/23/2017   Cellulitis of great toe of left foot 07/23/2017   Seizures ([place]) 07/13/2017   CKD (chronic kidney disease) stage 3, GFR 30-59 ml/min (HCC) 07/06/2017   Type 2 diabetes mellitus with diabetic neuropathy, without long-term current use of insulin ([place]) 07/02/2017   Seizure disorder ([place]) 07/02/2017   Pure hypercholesterolemia 07/02/2017   Obesity (BMI 30.0-34.9) 07/02/2017   History of prostate cancer 07/02/2017   Coronary artery disease involving coronary bypass graft of native heart without angina pectoris 07/02/2017   Ricard Dillon PT, DPT 06/19/2021, 4:31 PM  [place] MAIN [facility] SERVICES [place], [place], [address] Phone: [phone]   Fax:  [phone]  Name: Elijah Oliver MRN: [id number] Date of Birth: Jan 24, 1941

## 2021-06-23 ENCOUNTER — Ambulatory Visit: Payer: Medicare Other

## 2021-06-23 ENCOUNTER — Other Ambulatory Visit: Payer: Self-pay

## 2021-06-23 DIAGNOSIS — R2681 Unsteadiness on feet: Secondary | ICD-10-CM

## 2021-06-23 DIAGNOSIS — R278 Other lack of coordination: Secondary | ICD-10-CM

## 2021-06-23 DIAGNOSIS — M6281 Muscle weakness (generalized): Secondary | ICD-10-CM

## 2021-06-23 DIAGNOSIS — R2689 Other abnormalities of gait and mobility: Secondary | ICD-10-CM

## 2021-06-23 NOTE — Therapy (Signed)
[place] MAIN [facility] SERVICES [address] [place], [place], [address] Phone: [phone]   Fax:  [phone]  Physical Therapy Treatment  Patient Details  Name: Elijah Oliver MRN: [id number] Date of Birth: 1941-11-17 Referring Provider (PT): Dion Body, MD   Encounter Date: 06/23/2021   PT End of Session - 06/23/21 0852     Visit Number 18    Number of Visits 25    Date for PT Re-Evaluation 07/16/21    Authorization Type Traditional Medicare A&B    Authorization Time Period eval performed 1/74/9449, cert for 6/75/9163    PT Start Time 0805    PT Stop Time 0849    PT Time Calculation (min) 44 min    Equipment Utilized During Treatment Gait belt    Activity Tolerance Patient tolerated treatment well    Behavior During Therapy [facility] for tasks assessed/performed             Past Medical History:  Diagnosis Date   Backache 07/23/2017   Benign essential hypertension 07/23/2017   Bladder cancer ([place]) 07/23/2017   Cellulitis of great toe of left foot 07/23/2017   CKD (chronic kidney disease) stage 3, GFR 30-59 ml/min ([address]) 07/06/2017   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   Overview:  Sequential left internal mammary to the mid LAD and distal LAD, SVG to the second obtuse marginal and SVG to the right coronary artery. June 24th, 2006 Dr. 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 07/23/2017   Neck sprain 07/23/2017   Neoplasm of uncertain behavior of skin 07/23/2017   Nephrolithiasis 07/23/2017   Pure hypercholesterolemia 07/02/2017   Seizure disorder ([place]) 07/02/2017   Shoulder strain 07/23/2017   Sleep apnea 07/23/2017   SOB (shortness of breath) 07/23/2017   Thoracic sprain 07/23/2017   Type 2 diabetes mellitus with diabetic neuropathy, without long-term current use of insulin ([place]) 07/02/2017   Urge incontinence of urine 07/23/2017    Past Surgical History:   Procedure Laterality Date   CARDIAC SURGERY  2006   PROSTATECTOMY  2004   REPLACEMENT TOTAL KNEE  2016   TRANSURETHRAL RESECTION OF BLADDER TUMOR WITH GYRUS (TURBT-GYRUS)  2010?   URETEROSCOPY WITH HOLMIUM LASER LITHOTRIPSY  2010?    There were no vitals filed for this visit.   Subjective Assessment - 06/23/21 0851     Subjective Pt reports some improvement in LE stiffness over the past weekend. No other changes reported.    Pertinent History Pt is a pleasant 80 y/o male presenting to PT without an AD and with c/o impaired balance and gait disturbance that started over 4 years ago after his wife passed away from [place]. Per chart pt PMH includes: HTN, HLD, obesity, functional incontinence, pure hypercholesteroliemia, DMII, seizure disorder, CAD involving coronary bypass graft of native heart without angina pectoris (2006), hx of prostate CA, hx bladder CA, CKD, sleep apnea, SOB, knee replacement, per pt he has neuropathy in hands and feet    Limitations Standing;Walking;Lifting;House hold activities;Writing   writing affected by neuropathy in hands   How long can you sit comfortably? not affected    How long can you stand comfortably? a couple minutes, must hold onto something    How long can you walk comfortably? avoids walking around people, afraid of being bumped into and falling    Diagnostic tests pt reports no recent diagnostic testing, had knee surgery in 2016  Patient Stated Goals Wants to be able to stand without feeling "wobbly" and to improve his walking.    Currently in Pain? No/denies              Treatment -   THEREX-  Nustep level 3 - x 5 min (unbilled)  PT instructs pt through warm-up- Ankle pumps 20x each LE ABCs - 1 round each LE,  At Matrix cable machine- Seated hamstring curls with 2.5# 1x15, 2x10-12 reps each LE; rates medium-difficult  Leg press at 25# for 2 sets of 15-20 reps; rates difficult d/t having trouble keeping feet on the press, PT provides  hands-on assist with initial reps for technique. Cues pt to perform partial ROM to pt improves ability to perform eccentric component.   Seated LAQ with 5# AW on BLEs - 2x20 (during second set PT adds additional 3# AW for total of 8# AW on BLEs)  Seated PF with 8# AW on BLEs x25  In // bars with BUE support: [place] walks with 8# AW on BLEs - x multiple reps length of bars  PT provides cuing throughout session primarily in the form of VC/TC/demo to facilitate movement at target joints and improved muscle activation. Pt with good technique following cuing.    PT Education - 06/23/21 [id number]     Education Details exercise technique, body mechanics with leg press    Person(s) Educated Patient    Methods Explanation;Tactile cues;Demonstration;Verbal cues    Comprehension Returned demonstration;Verbalized understanding;Need further instruction              PT Short Term Goals - 05/26/21 0804       PT SHORT TERM GOAL #1   Title Pt will be independent with HEP in order to improve strength and balance in order to decrease fall risk and improve function at home and work.    Baseline 5/25: to be initiated next session; 6/27: pt indep with HEP    Time 6    Period Weeks    Status Achieved    Target Date 06/04/21               PT Long Term Goals - 05/26/21 0806       PT LONG TERM GOAL #1   Title Patient will increase Berg Balance score by > 6 points to demonstrate decreased fall risk during functional activities.    Baseline 5/25: 34/56; 6/27: 44/56    Time 12    Period Weeks    Status Achieved    Target Date 07/16/21      PT LONG TERM GOAL #2   Title Pt will improve DGI by at least 3 points in order to demonstrate clinically significant improvement in balance and decreased risk for falls.    Baseline 5/25: 18/24; 6/27: 21/24    Time 12    Period Weeks    Status Achieved    Target Date 07/16/21      PT LONG TERM GOAL #3   Title Pt will improve ABC by at least 13% in  order to demonstrate clinically significant improvement in balance confidence.    Baseline 5/25: 66.87; 6/27: 70.63%    Time 12    Period Weeks    Status On-going    Target Date 07/16/21      PT LONG TERM GOAL #4   Title Patient will increase 10 meter walk test to >1.54m/s as to improve gait speed for better community ambulation and to reduce fall risk.  Baseline 5/25: 0.86 m/s; 6/27: 0.97 m/s    Time 12    Period Weeks    Status Partially Met    Target Date 07/16/21      PT LONG TERM GOAL #5   Title Patient will increase FOTO score to equal to or greater than 69  to demonstrate statistically significant improvement in mobility and quality of life.    Baseline 5/25: 59; 6/27: 57    Time 12    Period Weeks    Status On-going    Target Date 07/16/21      Additional Long Term Goals   Additional Long Term Goals Yes      PT LONG TERM GOAL #6   Title Pt will demonstrate ability to maintain SLB for at least 5 seconds on each LE in order to improve ability to safely navigate up/down curbs and over obstacles.    Baseline 6/27: Pt unable to maintain SLB on either LE    Time 12    Period Weeks    Status New    Target Date 08/18/21             Plan - 06/23/21 0900     Clinical Impression Statement Pt continues to progress toward PT goals with reports of improved LE stiffness this past weekend with activities. Session focused on LE strength and endurance training where PT instructed pt on leg press. Pt had difficulty maintaining BLEs on press d/t poor plantarflexor strength and neuropathy. With cuing to perform partial ROM reps pt exhibited improvement in technique without feet sliding off press, and pt was able to focus on eccentric component of exercise. The pt will continue to benefit from further skilled PT to improve BLE strength and balance in order to increase ease and safety with all functional mobility.    Personal Factors and Comorbidities Age;Fitness;Time since onset of  injury/illness/exacerbation;Comorbidity 1;Comorbidity 2;Comorbidity 3+    Comorbidities pertinent per chart/pt: DMII, impaired sensation LEs, OA with hx of knee replacement, neuropathy, HTN    Examination-Activity Limitations Bathing;Carry;Lift;Stand;Locomotion Level;Transfers;Hygiene/Grooming;Reach Overhead;Stairs;Bend;Dressing;Squat    Examination-Participation Restrictions Community Activity;Shop;Volunteer;Cleaning;Yard Work;Church;Laundry;Meal Prep    Stability/Clinical Decision Making Stable/Uncomplicated    Rehab Potential Good    PT Frequency 2x / week    PT Duration 12 weeks    PT Treatment/Interventions ADLs/Self Care Home Management;Moist Heat;Cryotherapy;DME Instruction;Gait training;Stair training;Functional mobility training;Therapeutic activities;Therapeutic exercise;Balance training;Neuromuscular re-education;Patient/family education;Orthotic Fit/Training;Manual techniques;Passive range of motion;Energy conservation;Splinting;Dry needling;Taping;Visual/perceptual remediation/compensation;Biofeedback    PT Next Visit Plan Continues with strengthening and gait based balance and motor control training, half foam under heels balance/coordination exercise, balance tasks that rely on vesetibular cues; balance tasks involving active knee flexion to ext., progress resisted PF exercises in sitting; dynamic balance with quick turns, continue POC as previously indicated    PT Home Exercise Plan No updates today    Consulted and Agree with Plan of Care Patient             Patient will benefit from skilled therapeutic intervention in order to improve the following deficits and impairments:  Abnormal gait, Decreased activity tolerance, Improper body mechanics, Impaired sensation, Decreased balance, Decreased coordination, Decreased mobility, Difficulty walking, Impaired vision/preception, Postural dysfunction, Decreased strength, Decreased range of motion, Decreased endurance, Hypomobility,  Impaired flexibility, Pain  Visit Diagnosis: Muscle weakness (generalized)  Other abnormalities of gait and mobility  Other lack of coordination  Unsteadiness on feet     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   Cellulitis 12/01/2018   Dysarthria 12/01/2018   HTN (hypertension) 12/01/2018   Other hyperlipidemia 12/01/2018   DNR (do not resuscitate) 03/07/2018   Urge incontinence of urine 07/23/2017   Thoracic sprain 07/23/2017   Stye 07/23/2017   SOB (shortness of breath) 07/23/2017   Sleep apnea 07/23/2017   Shoulder strain 07/23/2017   Nephrolithiasis 07/23/2017   Neoplasm of uncertain behavior of skin 07/23/2017   Neck sprain 07/23/2017   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 07/23/2017   Leg swelling 07/23/2017   Ingrowing nail with infection 07/23/2017   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   DM (diabetes mellitus) ([place]) 07/23/2017   Bladder cancer ([place]) 07/23/2017   Benign essential hypertension 07/23/2017   Backache 07/23/2017   Cellulitis of great toe of left foot 07/23/2017   Seizures ([place]) 07/13/2017   CKD (chronic kidney disease) stage 3, GFR 30-59 ml/min (HCC) 07/06/2017   Type 2 diabetes mellitus with diabetic neuropathy, without long-term current use of insulin ([place]) 07/02/2017   Seizure disorder ([place]) 07/02/2017   Pure hypercholesterolemia 07/02/2017   Obesity (BMI 30.0-34.9) 07/02/2017   History of prostate cancer 07/02/2017   Coronary artery disease involving coronary bypass graft of native heart without angina pectoris 07/02/2017   Ricard Dillon PT, DPT 06/23/2021, 9:12 AM  [place] MAIN [facility] SERVICES [place], [place], [address] Phone: [phone]   Fax:  [phone]  Name: MERLIN GOLDEN MRN: [id number] Date of Birth: June 21, 1941

## 2021-06-26 ENCOUNTER — Ambulatory Visit: Payer: Medicare Other

## 2021-06-26 ENCOUNTER — Other Ambulatory Visit: Payer: Self-pay

## 2021-06-26 DIAGNOSIS — M6281 Muscle weakness (generalized): Secondary | ICD-10-CM

## 2021-06-26 DIAGNOSIS — R2689 Other abnormalities of gait and mobility: Secondary | ICD-10-CM

## 2021-06-26 DIAGNOSIS — R2681 Unsteadiness on feet: Secondary | ICD-10-CM

## 2021-06-26 DIAGNOSIS — R278 Other lack of coordination: Secondary | ICD-10-CM

## 2021-06-26 NOTE — Therapy (Signed)
[place] MAIN [facility] SERVICES [address] [place], [place], [address] Phone: [phone]   Fax:  [phone]  Physical Therapy Treatment  Patient Details  Name: Elijah Oliver MRN: [id number] Date of Birth: Aug 05, 1941 Referring Provider (PT): Dion Body, MD   Encounter Date: 06/26/2021   PT End of Session - 06/26/21 0854     Visit Number 19    Number of Visits 25    Date for PT Re-Evaluation 07/16/21    Authorization Type Traditional Medicare A&B    Authorization Time Period eval performed 2/82/0601, cert for 5/61/5379    PT Start Time 0803    PT Stop Time 0846    PT Time Calculation (min) 43 min    Equipment Utilized During Treatment Gait belt    Activity Tolerance Patient tolerated treatment well    Behavior During Therapy [facility] for tasks assessed/performed             Past Medical History:  Diagnosis Date   Backache 07/23/2017   Benign essential hypertension 07/23/2017   Bladder cancer ([place]) 07/23/2017   Cellulitis of great toe of left foot 07/23/2017   CKD (chronic kidney disease) stage 3, GFR 30-59 ml/min ([place]) 07/06/2017   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   Overview:  Sequential left internal mammary to the mid LAD and distal LAD, SVG to the second obtuse marginal and SVG to the right coronary artery. June 24th, 2006 Dr. 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 07/23/2017   Neck sprain 07/23/2017   Neoplasm of uncertain behavior of skin 07/23/2017   Nephrolithiasis 07/23/2017   Pure hypercholesterolemia 07/02/2017   Seizure disorder ([place]) 07/02/2017   Shoulder strain 07/23/2017   Sleep apnea 07/23/2017   SOB (shortness of breath) 07/23/2017   Thoracic sprain 07/23/2017   Type 2 diabetes mellitus with diabetic neuropathy, without long-term current use of insulin ([place]) 07/02/2017   Urge incontinence of urine 07/23/2017    Past Surgical History:   Procedure Laterality Date   CARDIAC SURGERY  2006   PROSTATECTOMY  2004   REPLACEMENT TOTAL KNEE  2016   TRANSURETHRAL RESECTION OF BLADDER TUMOR WITH GYRUS (TURBT-GYRUS)  2010?   URETEROSCOPY WITH HOLMIUM LASER LITHOTRIPSY  2010?    There were no vitals filed for this visit.  Subjective Assessment - 06/26/21 0803     Subjective Pt has been doing HEP. He reports no pain today. Reports no falls or near-falls.    Pertinent History Pt is a pleasant 80 y/o male presenting to PT without an AD and with c/o impaired balance and gait disturbance that started over 4 years ago after his wife passed away from [place]. Per chart pt PMH includes: HTN, HLD, obesity, functional incontinence, pure hypercholesteroliemia, DMII, seizure disorder, CAD involving coronary bypass graft of native heart without angina pectoris (2006), hx of prostate CA, hx bladder CA, CKD, sleep apnea, SOB, knee replacement, per pt he has neuropathy in hands and feet    Limitations Standing;Walking;Lifting;House hold activities;Writing   writing affected by neuropathy in hands   How long can you sit comfortably? not affected    How long can you stand comfortably? a couple minutes, must hold onto something    How long can you walk comfortably? avoids walking around people, afraid of being bumped into and falling    Diagnostic tests pt reports no recent diagnostic testing, had knee surgery in 2016  Patient Stated Goals Wants to be able to stand without feeling "wobbly" and to improve his walking.    Currently in Pain? No/denies              Treatment -   THEREX-  Nustep level 3 - x 5 min (unbilled) - pt rates medium. Maintains SPM 80s-90s.  PT instructs pt through warm-up- Seated marches 20x  Ankle pumps 20x each LE ABCs - 1 round each LE,  At Matrix cable machine- Seated hamstring curls with 2.5# 1x15 B, 7.5# 1x10, 12.5 10x each LE; rates medium, says more difficult on LLE   Standing heel raise - unable after several  attempts  Seated rocker board PF - 2x20; improved movement at ankle/target joint  Seated GTB plantarflexion - 2x10-12  Green thera-putty for your R hand - 2 min to improve grip strength for safety with bracing on support surfaces  At support surface: Mini squats with cuing for technique (improving hip flexion to reduce force through B knees). 10x   Neuro Re-ed  On half-foam NBOS: DF position (toes up on foam) no LOB - 60 sec PF position (heels on foam) -  x multiple minutes; difficulty with RLE and intermittent UE support to maintain balance. DF position, WBOS, head turns, vertical and horizontal - 10x each direction for each; intermittent UE support DF position, WBOS, mini squats - 3x5. Cuing for technique.   PT provides cuing throughout session primarily in the form of VC/TC/demo to facilitate movement at target joints and improved muscle activation. Pt with good technique following cuing.      PT Education - 06/26/21 0853     Education Details exercise technique, body mechanics    Person(s) Educated Patient    Methods Demonstration;Explanation;Verbal cues    Comprehension Returned demonstration;Verbalized understanding;Need further instruction              PT Short Term Goals - 05/26/21 0804       PT SHORT TERM GOAL #1   Title Pt will be independent with HEP in order to improve strength and balance in order to decrease fall risk and improve function at home and work.    Baseline 5/25: to be initiated next session; 6/27: pt indep with HEP    Time 6    Period Weeks    Status Achieved    Target Date 06/04/21               PT Long Term Goals - 05/26/21 0806       PT LONG TERM GOAL #1   Title Patient will increase Berg Balance score by > 6 points to demonstrate decreased fall risk during functional activities.    Baseline 5/25: 34/56; 6/27: 44/56    Time 12    Period Weeks    Status Achieved    Target Date 07/16/21      PT LONG TERM GOAL #2   Title Pt  will improve DGI by at least 3 points in order to demonstrate clinically significant improvement in balance and decreased risk for falls.    Baseline 5/25: 18/24; 6/27: 21/24    Time 12    Period Weeks    Status Achieved    Target Date 07/16/21      PT LONG TERM GOAL #3   Title Pt will improve ABC by at least 13% in order to demonstrate clinically significant improvement in balance confidence.    Baseline 5/25: 66.87; 6/27: 70.63%    Time 12  Period Weeks    Status On-going    Target Date 07/16/21      PT LONG TERM GOAL #4   Title Patient will increase 10 meter walk test to >1.45m/s as to improve gait speed for better community ambulation and to reduce fall risk.    Baseline 5/25: 0.86 m/s; 6/27: 0.97 m/s    Time 12    Period Weeks    Status Partially Met    Target Date 07/16/21      PT LONG TERM GOAL #5   Title Patient will increase FOTO score to equal to or greater than 69  to demonstrate statistically significant improvement in mobility and quality of life.    Baseline 5/25: 59; 6/27: 57    Time 12    Period Weeks    Status On-going    Target Date 07/16/21      Additional Long Term Goals   Additional Long Term Goals Yes      PT LONG TERM GOAL #6   Title Pt will demonstrate ability to maintain SLB for at least 5 seconds on each LE in order to improve ability to safely navigate up/down curbs and over obstacles.    Baseline 6/27: Pt unable to maintain SLB on either LE    Time 12    Period Weeks    Status New    Target Date 08/18/21                   Plan - 06/26/21 0854     Clinical Impression Statement PT progresses on level of resistance used with LE strength training, indicating improved BLE strength and carryover between sessions. Pt still most challenged with PF and balance in PF-biased positions requiring frequent UE support to maintain balance. This is particularly evident on RLE>LLE. The pt will benefit from further skilled PT to further improve BLE  strength, balance and functional mobility to increase ease and safety with ADLs.    Personal Factors and Comorbidities Age;Fitness;Time since onset of injury/illness/exacerbation;Comorbidity 1;Comorbidity 2;Comorbidity 3+    Comorbidities pertinent per chart/pt: DMII, impaired sensation LEs, OA with hx of knee replacement, neuropathy, HTN    Examination-Activity Limitations Bathing;Carry;Lift;Stand;Locomotion Level;Transfers;Hygiene/Grooming;Reach Overhead;Stairs;Bend;Dressing;Squat    Examination-Participation Restrictions Community Activity;Shop;Volunteer;Cleaning;Yard Work;Church;Laundry;Meal Prep    Stability/Clinical Decision Making Stable/Uncomplicated    Rehab Potential Good    PT Frequency 2x / week    PT Duration 12 weeks    PT Treatment/Interventions ADLs/Self Care Home Management;Moist Heat;Cryotherapy;DME Instruction;Gait training;Stair training;Functional mobility training;Therapeutic activities;Therapeutic exercise;Balance training;Neuromuscular re-education;Patient/family education;Orthotic Fit/Training;Manual techniques;Passive range of motion;Energy conservation;Splinting;Dry needling;Taping;Visual/perceptual remediation/compensation;Biofeedback    PT Next Visit Plan Continues with strengthening and gait based balance and motor control training, half foam under heels balance/coordination exercise, balance tasks that rely on vesetibular cues; balance tasks involving active knee flexion to ext., progress resisted PF exercises in sitting; dynamic balance with quick turns, continue POC as previously indicated    PT Home Exercise Plan No updates today; issued thera putty (green) for improved grip strength to increase safety with challenging balance tasks    Consulted and Agree with Plan of Care Patient             Patient will benefit from skilled therapeutic intervention in order to improve the following deficits and impairments:  Abnormal gait, Decreased activity tolerance, Improper  body mechanics, Impaired sensation, Decreased balance, Decreased coordination, Decreased mobility, Difficulty walking, Impaired vision/preception, Postural dysfunction, Decreased strength, Decreased range of motion, Decreased endurance, Hypomobility, Impaired flexibility, Pain  Visit Diagnosis:  Unsteadiness on feet  Other lack of coordination  Other abnormalities of gait and mobility  Muscle weakness (generalized)     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   Cellulitis 12/01/2018   Dysarthria 12/01/2018   HTN (hypertension) 12/01/2018   Other hyperlipidemia 12/01/2018   DNR (do not resuscitate) 03/07/2018   Urge incontinence of urine 07/23/2017   Thoracic sprain 07/23/2017   Stye 07/23/2017   SOB (shortness of breath) 07/23/2017   Sleep apnea 07/23/2017   Shoulder strain 07/23/2017   Nephrolithiasis 07/23/2017   Neoplasm of uncertain behavior of skin 07/23/2017   Neck sprain 07/23/2017   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 07/23/2017   Leg swelling 07/23/2017   Ingrowing nail with infection 07/23/2017   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   DM (diabetes mellitus) ([place]) 07/23/2017   Bladder cancer ([place]) 07/23/2017   Benign essential hypertension 07/23/2017   Backache 07/23/2017   Cellulitis of great toe of left foot 07/23/2017   Seizures ([place]) 07/13/2017   CKD (chronic kidney disease) stage 3, GFR 30-59 ml/min (HCC) 07/06/2017   Type 2 diabetes mellitus with diabetic neuropathy, without long-term current use of insulin ([place]) 07/02/2017   Seizure disorder ([place]) 07/02/2017   Pure hypercholesterolemia 07/02/2017   Obesity (BMI 30.0-34.9) 07/02/2017   History of prostate cancer 07/02/2017   Coronary artery disease involving coronary bypass graft of native heart without angina pectoris 07/02/2017   Ricard Dillon PT, DPT  06/26/2021, 10:28 AM  [place] MAIN [facility] SERVICES [address] [place], [place], [address] Phone: [phone]   Fax:  [phone]  Name: Elijah Oliver MRN: [id number] Date of Birth: 1940-12-16

## 2021-07-02 ENCOUNTER — Other Ambulatory Visit: Payer: Self-pay

## 2021-07-02 ENCOUNTER — Ambulatory Visit: Payer: Medicare Other | Attending: Family Medicine

## 2021-07-02 DIAGNOSIS — R2681 Unsteadiness on feet: Secondary | ICD-10-CM | POA: Diagnosis present

## 2021-07-02 DIAGNOSIS — M6281 Muscle weakness (generalized): Secondary | ICD-10-CM | POA: Diagnosis present

## 2021-07-02 DIAGNOSIS — R2689 Other abnormalities of gait and mobility: Secondary | ICD-10-CM | POA: Diagnosis present

## 2021-07-02 DIAGNOSIS — R278 Other lack of coordination: Secondary | ICD-10-CM | POA: Insufficient documentation

## 2021-07-02 NOTE — Therapy (Signed)
[place] MAIN [facility] SERVICES [address] [place], [place], [address] Phone: [phone]   Fax:  [phone]  Physical Therapy Treatment/Physical Therapy Progress Note   Dates of reporting period  05/26/2021   to   07/02/2021 VISIT 20   Patient Details  Name: Elijah Oliver MRN: [id number] Date of Birth: Apr 05, 1941 Referring Provider (PT): Dion Body, MD   Encounter Date: 07/02/2021   PT End of Session - 07/02/21 1129     Visit Number 20    Number of Visits 25    Date for PT Re-Evaluation 07/16/21    Authorization Type Traditional Medicare A&B    Authorization Time Period eval performed 08/03/91, cert for 02/26/761    PT Start Time 0932    PT Stop Time 1015    PT Time Calculation (min) 43 min    Equipment Utilized During Treatment Gait belt    Activity Tolerance Patient tolerated treatment well    Behavior During Therapy [facility] for tasks assessed/performed             Past Medical History:  Diagnosis Date   Backache 07/23/2017   Benign essential hypertension 07/23/2017   Bladder cancer ([place]) 07/23/2017   Cellulitis of great toe of left foot 07/23/2017   CKD (chronic kidney disease) stage 3, GFR 30-59 ml/min ([place]) 07/06/2017   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   Overview:  Sequential left internal mammary to the mid LAD and distal LAD, SVG to the second obtuse marginal and SVG to the right coronary artery. June 24th, 2006 Dr. 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 07/23/2017   Neck sprain 07/23/2017   Neoplasm of uncertain behavior of skin 07/23/2017   Nephrolithiasis 07/23/2017   Pure hypercholesterolemia 07/02/2017   Seizure disorder ([place]) 07/02/2017   Shoulder strain 07/23/2017   Sleep apnea 07/23/2017   SOB (shortness of breath) 07/23/2017   Thoracic sprain 07/23/2017   Type 2 diabetes mellitus with diabetic neuropathy, without long-term current  use of insulin ([place]) 07/02/2017   Urge incontinence of urine 07/23/2017    Past Surgical History:  Procedure Laterality Date   CARDIAC SURGERY  2006   PROSTATECTOMY  2004   REPLACEMENT TOTAL KNEE  2016   TRANSURETHRAL RESECTION OF BLADDER TUMOR WITH GYRUS (TURBT-GYRUS)  2010?   URETEROSCOPY WITH HOLMIUM LASER LITHOTRIPSY  2010?    There were no vitals filed for this visit.   Subjective Assessment - 07/02/21 1128     Subjective Pt reports consistently performing HEP. Reports continued difficulty with B knee stiffness.    Pertinent History Pt is a pleasant 80 y/o male presenting to PT without an AD and with c/o impaired balance and gait disturbance that started over 4 years ago after his wife passed away from [place]. Per chart pt PMH includes: HTN, HLD, obesity, functional incontinence, pure hypercholesteroliemia, DMII, seizure disorder, CAD involving coronary bypass graft of native heart without angina pectoris (2006), hx of prostate CA, hx bladder CA, CKD, sleep apnea, SOB, knee replacement, per pt he has neuropathy in hands and feet    Limitations Standing;Walking;Lifting;House hold activities;Writing   writing affected by neuropathy in hands   How long can you sit comfortably? not affected    How long can you stand comfortably? a couple minutes, must hold onto something    How long can you walk comfortably? avoids walking around people, afraid of being bumped into and falling  Diagnostic tests pt reports no recent diagnostic testing, had knee surgery in 2016    Patient Stated Goals Wants to be able to stand without feeling "wobbly" and to improve his walking.    Currently in Pain? No/denies            TREATMENT NOTE- retesting of goals  10MWT: 1.09 m/s met  PT instructs pt through warm-up: Standing DF 20x BLEs Standing marches 15x BLEs  Several warm-up attempts on SLB prior to SLB testing in 20-30 sec bouts  At support surface- SLB test - partially met LLE 7 sec RLE 2-3 sec    ABC SCALE: 63.14%  FOTO: 55   Nustep level 3 - x 5 min (unbilled) - pt rates medium. Maintains SPM 90s.  Standing hip abduction 5# AW 3x10 BLEs  Seated knee ext. With 5# AW 2x20 BLEs  PT provides cuing throughout session primarily in the form of VC/TC/demo to facilitate movement at target joints and improved muscle activation. Pt with good technique following cuing.    Access Code: [email or web address] URL: [email or web address] Date: 07/02/2021 Prepared by: Ricard Dillon  Exercises Seated Long Arc Quad with Ankle Weight - 1 x daily - 3 x weekly - 3 sets - 10 reps Standing Alternating Knee Flexion - 1 x daily - 3 x weekly - 3 sets - 10 reps Instructs pt to perform standing knee flexion exercise at support surface such as a counter, something that will not move with weight applied to it. Pt verbalizes understanding.    PT Education - 07/02/21 1129     Education Details goal reassessment, progress in PT, indications for POC, addition to HEP to address knee stiffness    Person(s) Educated Patient    Methods Explanation;Demonstration;Verbal cues;Handout    Comprehension Verbalized understanding;Returned demonstration              PT Short Term Goals - 07/02/21 1144       PT SHORT TERM GOAL #1   Title Pt will be independent with HEP in order to improve strength and balance in order to decrease fall risk and improve function at home and work.    Baseline 5/25: to be initiated next session; 6/27: pt indep with HEP    Time 6    Period Weeks    Status Achieved    Target Date 06/04/21               PT Long Term Goals - 07/02/21 0930       PT LONG TERM GOAL #1   Title Patient will increase Berg Balance score by > 6 points to demonstrate decreased fall risk during functional activities.    Baseline 5/25: 34/56; 6/27: 44/56    Time 12    Period Weeks    Status Achieved      PT LONG TERM GOAL #2   Title Pt will improve DGI by at least 3 points in order to  demonstrate clinically significant improvement in balance and decreased risk for falls.    Baseline 5/25: 18/24; 6/27: 21/24    Time 12    Period Weeks    Status Achieved      PT LONG TERM GOAL #3   Title Pt will improve ABC by at least 13% in order to demonstrate clinically significant improvement in balance confidence.    Baseline 5/25: 66.87; 6/27: 70.63%; 8/3: 63.14%    Time 12    Period Weeks    Status On-going    Target Date 07/16/21  PT LONG TERM GOAL #4   Title Patient will increase 10 meter walk test to >1.55ms as to improve gait speed for better community ambulation and to reduce fall risk.    Baseline 5/25: 0.86 m/s; 6/27: 0.97 m/s; 8/3: 1.09 m/s    Time 12    Period Weeks    Status Achieved      PT LONG TERM GOAL #5   Title Patient will increase FOTO score to equal to or greater than 69  to demonstrate statistically significant improvement in mobility and quality of life.    Baseline 5/25: 59; 6/27: 57; 8/3: 55 (pt reports questions not fully relevant to his problem)    Time 12    Period Weeks    Status On-going    Target Date 07/16/21      PT LONG TERM GOAL #6   Title Pt will demonstrate ability to maintain SLB for at least 5 seconds on each LE in order to improve ability to safely navigate up/down curbs and over obstacles.    Baseline 6/27: Pt unable to maintain SLB on either LE; 8/3: LLE 7 sec  RLE 2-3 sec    Time 12    Period Weeks    Status Partially Met    Target Date 08/18/21                   Plan - 07/02/21 1131     Clinical Impression Statement Goals reassessed for progress note. Pt making gains this reporting period, partially meeting SLB goal and meeting10MWT goal, indicating improved balance and gait speed/ability. Pt did see slight decrease in FOTO score to 55, however, pt reports he does not feel questions asked on FOTO were relevant to why he is in PT. Pt also had decrease in ABC Balance confidence score to 63.14%. Pt reports he feels  his greatest limitation is the stiffness in his knees after standing for a while. PT has added exercises to HEP (see note for details) to address LE stiffness. Patient's condition has the potential to improve in response to therapy. Maximum improvement is yet to be obtained. The anticipated improvement is attainable and reasonable in a generally predictable time. The pt will benefit form further skilled PT to continue to improve balance, gait ability/speed, and LE strength and moblity to increase ease and safety with ADLs.    Personal Factors and Comorbidities Age;Fitness;Time since onset of injury/illness/exacerbation;Comorbidity 1;Comorbidity 2;Comorbidity 3+    Comorbidities pertinent per chart/pt: DMII, impaired sensation LEs, OA with hx of knee replacement, neuropathy, HTN    Examination-Activity Limitations Bathing;Carry;Lift;Stand;Locomotion Level;Transfers;Hygiene/Grooming;Reach Overhead;Stairs;Bend;Dressing;Squat    Examination-Participation Restrictions Community Activity;Shop;Volunteer;Cleaning;Yard Work;Church;Laundry;Meal Prep    Stability/Clinical Decision Making Stable/Uncomplicated    Rehab Potential Good    PT Frequency 2x / week    PT Duration 12 weeks    PT Treatment/Interventions ADLs/Self Care Home Management;Moist Heat;Cryotherapy;DME Instruction;Gait training;Stair training;Functional mobility training;Therapeutic activities;Therapeutic exercise;Balance training;Neuromuscular re-education;Patient/family education;Orthotic Fit/Training;Manual techniques;Passive range of motion;Energy conservation;Splinting;Dry needling;Taping;Visual/perceptual remediation/compensation;Biofeedback    PT Next Visit Plan Continues with strengthening and gait based balance and motor control training, half foam under heels balance/coordination exercise, balance tasks that rely on vesetibular cues; balance tasks involving active knee flexion to ext., progress resisted PF exercises in sitting; dynamic  balance with quick turns, continue POC as previously indicated    PT Home Exercise Plan No updates today; issued thera putty (green) for improved grip strength to increase safety with challenging balance tasks; 8/3: Access Code: [email or web address]    Consulted and  Agree with Plan of Care Patient             Patient will benefit from skilled therapeutic intervention in order to improve the following deficits and impairments:  Abnormal gait, Decreased activity tolerance, Improper body mechanics, Impaired sensation, Decreased balance, Decreased coordination, Decreased mobility, Difficulty walking, Impaired vision/preception, Postural dysfunction, Decreased strength, Decreased range of motion, Decreased endurance, Hypomobility, Impaired flexibility, Pain  Visit Diagnosis: Other abnormalities of gait and mobility  Unsteadiness on feet  Muscle weakness (generalized)  Other lack of coordination     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   Cellulitis 12/01/2018   Dysarthria 12/01/2018   HTN (hypertension) 12/01/2018   Other hyperlipidemia 12/01/2018   DNR (do not resuscitate) 03/07/2018   Urge incontinence of urine 07/23/2017   Thoracic sprain 07/23/2017   Stye 07/23/2017   SOB (shortness of breath) 07/23/2017   Sleep apnea 07/23/2017   Shoulder strain 07/23/2017   Nephrolithiasis 07/23/2017   Neoplasm of uncertain behavior of skin 07/23/2017   Neck sprain 07/23/2017   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 07/23/2017   Leg swelling 07/23/2017   Ingrowing nail with infection 07/23/2017   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   DM (diabetes mellitus) ([place]) 07/23/2017   Bladder cancer ([place]) 07/23/2017   Benign essential hypertension 07/23/2017   Backache 07/23/2017   Cellulitis of great toe of left foot 07/23/2017   Seizures ([place]) 07/13/2017   CKD (chronic kidney disease) stage 3, GFR 30-59 ml/min  (HCC) 07/06/2017   Type 2 diabetes mellitus with diabetic neuropathy, without long-term current use of insulin ([place]) 07/02/2017   Seizure disorder ([place]) 07/02/2017   Pure hypercholesterolemia 07/02/2017   Obesity (BMI 30.0-34.9) 07/02/2017   History of prostate cancer 07/02/2017   Coronary artery disease involving coronary bypass graft of native heart without angina pectoris 07/02/2017   Ricard Dillon PT, DPT 07/02/2021, 2:11 PM  [place] [facility] MAIN [facility] SERVICES [address] [place], [place], [address] Phone: [phone]   Fax:  [phone]  Name: Elijah Oliver MRN: [id number] Date of Birth: March 12, 1941

## 2021-07-09 ENCOUNTER — Ambulatory Visit: Payer: Medicare Other

## 2021-07-09 ENCOUNTER — Other Ambulatory Visit: Payer: Self-pay

## 2021-07-09 DIAGNOSIS — R2689 Other abnormalities of gait and mobility: Secondary | ICD-10-CM

## 2021-07-09 DIAGNOSIS — R2681 Unsteadiness on feet: Secondary | ICD-10-CM

## 2021-07-09 DIAGNOSIS — M6281 Muscle weakness (generalized): Secondary | ICD-10-CM

## 2021-07-09 NOTE — Therapy (Signed)
[place] MAIN [facility] SERVICES [address] [place], [place], [address] Phone: [phone]   Fax:  [phone]  Physical Therapy Treatment  Patient Details  Name: Elijah Oliver MRN: [id number] Date of Birth: July 07, 1941 Referring Provider (PT): Dion Body, MD   Encounter Date: 07/09/2021   PT End of Session - 07/09/21 1304     Visit Number 21    Number of Visits 25    Date for PT Re-Evaluation 07/16/21    Authorization Type Traditional Medicare A&B    Authorization Time Period eval performed 6/38/4536, cert for 4/68/0321    PT Start Time 0930    PT Stop Time 1012    PT Time Calculation (min) 42 min    Equipment Utilized During Treatment Gait belt    Activity Tolerance Patient tolerated treatment well    Behavior During Therapy [facility] for tasks assessed/performed             Past Medical History:  Diagnosis Date   Backache 07/23/2017   Benign essential hypertension 07/23/2017   Bladder cancer ([place]) 07/23/2017   Cellulitis of great toe of left foot 07/23/2017   CKD (chronic kidney disease) stage 3, GFR 30-59 ml/min ([place]) 07/06/2017   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   Overview:  Sequential left internal mammary to the mid LAD and distal LAD, SVG to the second obtuse marginal and SVG to the right coronary artery. June 24th, 2006 Dr. 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 07/23/2017   Neck sprain 07/23/2017   Neoplasm of uncertain behavior of skin 07/23/2017   Nephrolithiasis 07/23/2017   Pure hypercholesterolemia 07/02/2017   Seizure disorder ([place]) 07/02/2017   Shoulder strain 07/23/2017   Sleep apnea 07/23/2017   SOB (shortness of breath) 07/23/2017   Thoracic sprain 07/23/2017   Type 2 diabetes mellitus with diabetic neuropathy, without long-term current use of insulin ([place]) 07/02/2017   Urge incontinence of urine 07/23/2017    Past Surgical History:   Procedure Laterality Date   CARDIAC SURGERY  2006   PROSTATECTOMY  2004   REPLACEMENT TOTAL KNEE  2016   TRANSURETHRAL RESECTION OF BLADDER TUMOR WITH GYRUS (TURBT-GYRUS)  2010?   URETEROSCOPY WITH HOLMIUM LASER LITHOTRIPSY  2010?    There were no vitals filed for this visit.   Subjective Assessment - 07/09/21 0930     Subjective Pt reports he has been doing his HEP. He reports continued knee stiffness.    Pertinent History Pt is a pleasant 80 y/o male presenting to PT without an AD and with c/o impaired balance and gait disturbance that started over 4 years ago after his wife passed away from [place]. Per chart pt PMH includes: HTN, HLD, obesity, functional incontinence, pure hypercholesteroliemia, DMII, seizure disorder, CAD involving coronary bypass graft of native heart without angina pectoris (2006), hx of prostate CA, hx bladder CA, CKD, sleep apnea, SOB, knee replacement, per pt he has neuropathy in hands and feet    Limitations Standing;Walking;Lifting;House hold activities;Writing   writing affected by neuropathy in hands   How long can you sit comfortably? not affected    How long can you stand comfortably? a couple minutes, must hold onto something    How long can you walk comfortably? avoids walking around people, afraid of being bumped into and falling    Diagnostic tests pt reports no recent diagnostic testing, had knee surgery in 2016    Patient  Stated Goals Wants to be able to stand without feeling "wobbly" and to improve his walking.    Currently in Pain? No/denies           Neuro Re-ed  Instructs pt through warm-up to promote improved performance with balance exercises ABCs - 1 round each  Ankle pumps 20x LAQ 20x  On half-foam: CGA throughout DF position (toes up on foam) no LOB - 60 sec PF position (heels on foam) -  x 60 sec DF position, WBOS, head turns, vertical and horizontal - x multiple reps each direction; intermittent UE support DF position, WBOS, EC 60  sec - intermittent UE support  PF position, WBOS, EC 60 sec - most difficult.   TherEx-  PT instructs pt in use of gym equipment to increase pt's independence with strength trianing Leg press - level 1-3 -10-15 reps for 2 sets Seated hamstring curls - levels 1-4 for 2 sets of 15-20 reps Seated knee extension - level 2 for 2 sets of 10-12 Nustep level 3 (seat 9) - SPM 80s-100s x 6 min; pt rates medium. Instructs in level of intensity and progression with exercise.   Standing hip abduction with 5# AW 3x10 BLEs   PT provides cuing throughout session primarily in the form of VC/TC/demo to facilitate movement at target joints and improved muscle activation. Pt with good technique following cuing.     PT Education - 07/09/21 1304     Education Details exercise technique, body mechanics, discussion of POC    Person(s) Educated Patient    Methods Explanation;Demonstration;Tactile cues;Verbal cues    Comprehension Verbalized understanding;Returned demonstration              PT Short Term Goals - 07/02/21 1144       PT SHORT TERM GOAL #1   Title Pt will be independent with HEP in order to improve strength and balance in order to decrease fall risk and improve function at home and work.    Baseline 5/25: to be initiated next session; 6/27: pt indep with HEP    Time 6    Period Weeks    Status Achieved    Target Date 06/04/21               PT Long Term Goals - 07/02/21 0930       PT LONG TERM GOAL #1   Title Patient will increase Berg Balance score by > 6 points to demonstrate decreased fall risk during functional activities.    Baseline 5/25: 34/56; 6/27: 44/56    Time 12    Period Weeks    Status Achieved      PT LONG TERM GOAL #2   Title Pt will improve DGI by at least 3 points in order to demonstrate clinically significant improvement in balance and decreased risk for falls.    Baseline 5/25: 18/24; 6/27: 21/24    Time 12    Period Weeks    Status Achieved       PT LONG TERM GOAL #3   Title Pt will improve ABC by at least 13% in order to demonstrate clinically significant improvement in balance confidence.    Baseline 5/25: 66.87; 6/27: 70.63%; 8/3: 63.14%    Time 12    Period Weeks    Status On-going    Target Date 07/16/21      PT LONG TERM GOAL #4   Title Patient will increase 10 meter walk test to >1.67m/s as to improve gait speed for  better community ambulation and to reduce fall risk.    Baseline 5/25: 0.86 m/s; 6/27: 0.97 m/s; 8/3: 1.09 m/s    Time 12    Period Weeks    Status Achieved      PT LONG TERM GOAL #5   Title Patient will increase FOTO score to equal to or greater than 69  to demonstrate statistically significant improvement in mobility and quality of life.    Baseline 5/25: 59; 6/27: 57; 8/3: 55 (pt reports questions not fully relevant to his problem)    Time 12    Period Weeks    Status On-going    Target Date 07/16/21      PT LONG TERM GOAL #6   Title Pt will demonstrate ability to maintain SLB for at least 5 seconds on each LE in order to improve ability to safely navigate up/down curbs and over obstacles.    Baseline 6/27: Pt unable to maintain SLB on either LE; 8/3: LLE 7 sec  RLE 2-3 sec    Time 12    Period Weeks    Status Partially Met    Target Date 08/18/21              Plan - 07/09/21 1006     Clinical Impression Statement PT instructs pt through therex in [place] as he will be taking a few weeks gap between PT sessions as a trial period to determine ability to continue progress outside of therapy. Pt was able to correctly demo proper technique with all exercises. He is still most challenged with balance tasks in PF position or with EC. The pt will benefit from further skilled PT to continue to increase balance, LE strength and endurance in order to decrease fall risk and increase QOL.    Personal Factors and Comorbidities Age;Fitness;Time since onset of injury/illness/exacerbation;Comorbidity  1;Comorbidity 2;Comorbidity 3+    Comorbidities pertinent per chart/pt: DMII, impaired sensation LEs, OA with hx of knee replacement, neuropathy, HTN    Examination-Activity Limitations Bathing;Carry;Lift;Stand;Locomotion Level;Transfers;Hygiene/Grooming;Reach Overhead;Stairs;Bend;Dressing;Squat    Examination-Participation Restrictions Community Activity;Shop;Volunteer;Cleaning;Yard Work;Church;Laundry;Meal Prep    Stability/Clinical Decision Making Stable/Uncomplicated    Rehab Potential Good    PT Frequency 2x / week    PT Duration 12 weeks    PT Treatment/Interventions ADLs/Self Care Home Management;Moist Heat;Cryotherapy;DME Instruction;Gait training;Stair training;Functional mobility training;Therapeutic activities;Therapeutic exercise;Balance training;Neuromuscular re-education;Patient/family education;Orthotic Fit/Training;Manual techniques;Passive range of motion;Energy conservation;Splinting;Dry needling;Taping;Visual/perceptual remediation/compensation;Biofeedback    PT Next Visit Plan Continues with strengthening and gait based balance and motor control training, half foam under heels balance/coordination exercise, balance tasks that rely on vesetibular cues; balance tasks involving active knee flexion to ext., progress resisted PF exercises in sitting; dynamic balance with quick turns, continue POC as previously indicated    PT Home Exercise Plan No updates today; issued thera putty (green) for improved grip strength to increase safety with challenging balance tasks; 8/3: Access Code: [email or web address]    Consulted and Agree with Plan of Care Patient             Patient will benefit from skilled therapeutic intervention in order to improve the following deficits and impairments:  Abnormal gait, Decreased activity tolerance, Improper body mechanics, Impaired sensation, Decreased balance, Decreased coordination, Decreased mobility, Difficulty walking, Impaired vision/preception, Postural  dysfunction, Decreased strength, Decreased range of motion, Decreased endurance, Hypomobility, Impaired flexibility, Pain  Visit Diagnosis: Muscle weakness (generalized)  Unsteadiness on feet  Other abnormalities of gait and mobility     Problem List Patient Active Problem List  Diagnosis Date Noted   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   Cellulitis 12/01/2018   Dysarthria 12/01/2018   HTN (hypertension) 12/01/2018   Other hyperlipidemia 12/01/2018   DNR (do not resuscitate) 03/07/2018   Urge incontinence of urine 07/23/2017   Thoracic sprain 07/23/2017   Stye 07/23/2017   SOB (shortness of breath) 07/23/2017   Sleep apnea 07/23/2017   Shoulder strain 07/23/2017   Nephrolithiasis 07/23/2017   Neoplasm of uncertain behavior of skin 07/23/2017   Neck sprain 07/23/2017   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 07/23/2017   Leg swelling 07/23/2017   Ingrowing nail with infection 07/23/2017   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   DM (diabetes mellitus) ([place]) 07/23/2017   Bladder cancer ([place]) 07/23/2017   Benign essential hypertension 07/23/2017   Backache 07/23/2017   Cellulitis of great toe of left foot 07/23/2017   Seizures ([place]) 07/13/2017   CKD (chronic kidney disease) stage 3, GFR 30-59 ml/min (HCC) 07/06/2017   Type 2 diabetes mellitus with diabetic neuropathy, without long-term current use of insulin ([place]) 07/02/2017   Seizure disorder ([place]) 07/02/2017   Pure hypercholesterolemia 07/02/2017   Obesity (BMI 30.0-34.9) 07/02/2017   History of prostate cancer 07/02/2017   Coronary artery disease involving coronary bypass graft of native heart without angina pectoris 07/02/2017   Ricard Dillon PT, DPT 07/09/2021, 1:14 PM  [place] [facility] MAIN [facility] SERVICES [place], [place], [address] Phone: [phone]   Fax:  [phone]  Name: Elijah Oliver MRN: [id number] Date of Birth: Jul 20, 1941

## 2021-07-30 ENCOUNTER — Other Ambulatory Visit: Payer: Self-pay

## 2021-07-30 ENCOUNTER — Ambulatory Visit: Payer: Medicare Other

## 2021-07-30 DIAGNOSIS — M6281 Muscle weakness (generalized): Secondary | ICD-10-CM

## 2021-07-30 DIAGNOSIS — R278 Other lack of coordination: Secondary | ICD-10-CM

## 2021-07-30 DIAGNOSIS — R2689 Other abnormalities of gait and mobility: Secondary | ICD-10-CM | POA: Diagnosis not present

## 2021-07-30 DIAGNOSIS — R2681 Unsteadiness on feet: Secondary | ICD-10-CM

## 2021-07-30 NOTE — Therapy (Signed)
[place] [facility] SERVICES [address] [place], [place], [address] Phone: [phone]   Fax:  [phone]  Physical Therapy Treatment/RECERT  Patient Details  Name: Elijah Oliver MRN: [id number] Date of Birth: March 20, 1941 Referring Provider (PT): Dion Body, MD   Encounter Date: 07/30/2021   PT End of Session - 07/30/21 0851     Visit Number 22    Number of Visits 22    Date for PT Re-Evaluation 09/24/21    Authorization Type Traditional Medicare A&B    Authorization Time Period eval performed 3/33/8329, cert for 1/91/6606    PT Start Time 0807    PT Stop Time 0846    PT Time Calculation (min) 39 min    Equipment Utilized During Treatment Gait belt    Activity Tolerance Patient tolerated treatment well    Behavior During Therapy [facility] for tasks assessed/performed             Past Medical History:  Diagnosis Date   Backache 07/23/2017   Benign essential hypertension 07/23/2017   Bladder cancer ([place]) 07/23/2017   Cellulitis of great toe of left foot 07/23/2017   CKD (chronic kidney disease) stage 3, GFR 30-59 ml/min ([place]) 07/06/2017   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   Overview:  Sequential left internal mammary to the mid LAD and distal LAD, SVG to the second obtuse marginal and SVG to the right coronary artery. June 24th, 2006 Dr. 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 07/23/2017   Neck sprain 07/23/2017   Neoplasm of uncertain behavior of skin 07/23/2017   Nephrolithiasis 07/23/2017   Pure hypercholesterolemia 07/02/2017   Seizure disorder ([place]) 07/02/2017   Shoulder strain 07/23/2017   Sleep apnea 07/23/2017   SOB (shortness of breath) 07/23/2017   Thoracic sprain 07/23/2017   Type 2 diabetes mellitus with diabetic neuropathy, without long-term current use of insulin ([place]) 07/02/2017   Urge incontinence of urine 07/23/2017    Past Surgical  History:  Procedure Laterality Date   CARDIAC SURGERY  2006   PROSTATECTOMY  2004   REPLACEMENT TOTAL KNEE  2016   TRANSURETHRAL RESECTION OF BLADDER TUMOR WITH GYRUS (TURBT-GYRUS)  2010?   URETEROSCOPY WITH HOLMIUM LASER LITHOTRIPSY  2010?    There were no vitals filed for this visit.   Subjective Assessment - 07/30/21 0807     Subjective Pt had a fall on Friday. He is unsure of why he fell. He says it happened fast. He has bruise on L arm and cuts/scabs on B elbows. He reports he has bruise on L leg. He reports he did not hit his head. He does not think he injured himself. He reports some residual soreness in L shoulder.    Pertinent History Pt is a pleasant 80 y/o male presenting to PT without an AD and with c/o impaired balance and gait disturbance that started over 4 years ago after his wife passed away from [place]. Per chart pt PMH includes: HTN, HLD, obesity, functional incontinence, pure hypercholesteroliemia, DMII, seizure disorder, CAD involving coronary bypass graft of native heart without angina pectoris (2006), hx of prostate CA, hx bladder CA, CKD, sleep apnea, SOB, knee replacement, per pt he has neuropathy in hands and feet    Limitations Standing;Walking;Lifting;House hold activities;Writing   writing affected by neuropathy in hands   How long can you sit comfortably? not affected    How long can you stand comfortably?  a couple minutes, must hold onto something    How long can you walk comfortably? avoids walking around people, afraid of being bumped into and falling    Diagnostic tests pt reports no recent diagnostic testing, had knee surgery in 2016    Patient Stated Goals Wants to be able to stand without feeling "wobbly" and to improve his walking.    Currently in Pain? Yes    Pain Location Shoulder    Pain Orientation Left             Neuro Re-ed  Instructs pt through warm-up to promote improved performance with balance exercises, motor control ABCs - 1 round each   Ankle pumps 20x LAQ 20x   // bars: with CGA-min a throughout Reactive postural control, perturbation training, focus on anterior and posterior direction 2 rounds; rates medium   On half-foam: CGA throughout PF position (heels on foam) -  x 30 sec PF position, WBOS, EC 60 sec - requires at least 2-1 finger support. Very challenging PF position, WBOS, EO with horizontal, vertical head-turns 10x each direction for each; intermittent UE support   PT provides cuing throughout session primarily in the form of VC/TC/demo to facilitate movement at target joints and improved muscle activation. Pt with good technique following cuing       PT Education - 07/30/21 0851     Education Details exercise technique, body mechanics, POC    Person(s) Educated Patient    Methods Explanation;Demonstration;Tactile cues;Verbal cues    Comprehension Verbalized understanding;Returned demonstration;Need further instruction              PT Short Term Goals - 07/30/21 0855       PT SHORT TERM GOAL #1   Title Pt will be independent with HEP in order to improve strength and balance in order to decrease fall risk and improve function at home and work.    Baseline 5/25: to be initiated next session; 6/27: pt indep with HEP    Time 6    Period Weeks    Status Achieved    Target Date 06/04/21               PT Long Term Goals - 07/30/21 0855       PT LONG TERM GOAL #1   Title Patient will increase Berg Balance score by > 6 points to demonstrate decreased fall risk during functional activities.    Baseline 5/25: 34/56; 6/27: 44/56    Time 12    Period Weeks    Status Achieved      PT LONG TERM GOAL #2   Title Pt will improve DGI by at least 3 points in order to demonstrate clinically significant improvement in balance and decreased risk for falls.    Baseline 5/25: 18/24; 6/27: 21/24    Time 12    Period Weeks    Status Achieved      PT LONG TERM GOAL #3   Title Pt will improve ABC by  at least 13% in order to demonstrate clinically significant improvement in balance confidence.    Baseline 5/25: 66.87; 6/27: 70.63%; 8/3: 63.14%    Time 12    Period Weeks    Status On-going    Target Date 09/24/21      PT LONG TERM GOAL #4   Title Patient will increase 10 meter walk test to >1.75m/s as to improve gait speed for better community ambulation and to reduce fall risk.    Baseline 5/25:  0.86 m/s; 6/27: 0.97 m/s; 8/3: 1.09 m/s    Time 12    Period Weeks    Status Achieved      PT LONG TERM GOAL #5   Title Patient will increase FOTO score to equal to or greater than 69  to demonstrate statistically significant improvement in mobility and quality of life.    Baseline 5/25: 59; 6/27: 57; 8/3: 55 (pt reports questions not fully relevant to his problem)    Time 12    Period Weeks    Status On-going    Target Date 09/24/21      PT LONG TERM GOAL #6   Title Pt will demonstrate ability to maintain SLB for at least 5 seconds on each LE in order to improve ability to safely navigate up/down curbs and over obstacles.    Baseline 6/27: Pt unable to maintain SLB on either LE; 8/3: LLE 7 sec  RLE 2-3 sec    Time 12    Period Weeks    Status Partially Met    Target Date 09/24/21                   Plan - 07/30/21 3500     Clinical Impression Statement Pt returns to PT after two week trial period. PT recerting pt for more as pt needs perturbation and continued balance training to reduce fall risk in the presence of recent fall. Please refer to note from 07/02/2021 for goal reassessment. Initiated perturbation training today, which should be a primary focus for future sessions. The pt will benefit from further skilled PT to improve balance, gait, LE strength and endurance to decrease fall risk.    Personal Factors and Comorbidities Age;Fitness;Time since onset of injury/illness/exacerbation;Comorbidity 1;Comorbidity 2;Comorbidity 3+    Comorbidities pertinent per chart/pt: DMII,  impaired sensation LEs, OA with hx of knee replacement, neuropathy, HTN    Examination-Activity Limitations Bathing;Carry;Lift;Stand;Locomotion Level;Transfers;Hygiene/Grooming;Reach Overhead;Stairs;Bend;Dressing;Squat    Examination-Participation Restrictions Community Activity;Shop;Volunteer;Cleaning;Yard Work;Church;Laundry;Meal Prep    Stability/Clinical Decision Making Stable/Uncomplicated    Rehab Potential Good    PT Frequency 2x / week    PT Duration 8 weeks    PT Treatment/Interventions ADLs/Self Care Home Management;Moist Heat;Cryotherapy;DME Instruction;Gait training;Stair training;Functional mobility training;Therapeutic activities;Therapeutic exercise;Balance training;Neuromuscular re-education;Patient/family education;Orthotic Fit/Training;Manual techniques;Passive range of motion;Energy conservation;Splinting;Dry needling;Taping;Visual/perceptual remediation/compensation;Biofeedback    PT Next Visit Plan Continues with strengthening and gait based balance and motor control training, half foam under heels balance/coordination exercise, balance tasks that rely on vesetibular cues; balance tasks involving active knee flexion to ext., progress resisted PF exercises in sitting; dynamic balance with quick turns, continue POC as previously indicated    PT Home Exercise Plan No updates today; issued thera putty (green) for improved grip strength to increase safety with challenging balance tasks; 8/3: Access Code: [email or web address]    Consulted and Agree with Plan of Care Patient             Patient will benefit from skilled therapeutic intervention in order to improve the following deficits and impairments:  Abnormal gait, Decreased activity tolerance, Improper body mechanics, Impaired sensation, Decreased balance, Decreased coordination, Decreased mobility, Difficulty walking, Impaired vision/preception, Postural dysfunction, Decreased strength, Decreased range of motion, Decreased endurance,  Hypomobility, Impaired flexibility, Pain  Visit Diagnosis: Unsteadiness on feet  Other abnormalities of gait and mobility  Muscle weakness (generalized)  Other lack of coordination     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   Cellulitis 12/01/2018   Dysarthria 12/01/2018   HTN (hypertension) 12/01/2018   Other hyperlipidemia 12/01/2018   DNR (do not resuscitate) 03/07/2018   Urge incontinence of urine 07/23/2017   Thoracic sprain 07/23/2017   Stye 07/23/2017   SOB (shortness of breath) 07/23/2017   Sleep apnea 07/23/2017   Shoulder strain 07/23/2017   Nephrolithiasis 07/23/2017   Neoplasm of uncertain behavior of skin 07/23/2017   Neck sprain 07/23/2017   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 07/23/2017   Leg swelling 07/23/2017   Ingrowing nail with infection 07/23/2017   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   DM (diabetes mellitus) ([place]) 07/23/2017   Bladder cancer ([place]) 07/23/2017   Benign essential hypertension 07/23/2017   Backache 07/23/2017   Cellulitis of great toe of left foot 07/23/2017   Seizures ([place]) 07/13/2017   CKD (chronic kidney disease) stage 3, GFR 30-59 ml/min ([facility]) 07/06/2017   Type 2 diabetes mellitus with diabetic neuropathy, without long-term current use of insulin ([place]) 07/02/2017   Seizure disorder ([place]) 07/02/2017   Pure hypercholesterolemia 07/02/2017   Obesity (BMI 30.0-34.9) 07/02/2017   History of prostate cancer 07/02/2017   Coronary artery disease involving coronary bypass graft of native heart without angina pectoris 07/02/2017   Ricard Dillon PT, DPT 07/30/2021, 8:57 AM  [place] MAIN [facility] SERVICES [address] [place], [place], [address] Phone: [phone]   Fax:  [phone]  Name: Elijah Oliver MRN: [id number] Date of Birth: Apr 27, 1941

## 2021-07-31 ENCOUNTER — Encounter: Payer: Self-pay | Admitting: Podiatry

## 2021-07-31 ENCOUNTER — Ambulatory Visit: Payer: Medicare Other

## 2021-07-31 ENCOUNTER — Ambulatory Visit (INDEPENDENT_AMBULATORY_CARE_PROVIDER_SITE_OTHER): Payer: Medicare Other | Admitting: Podiatry

## 2021-07-31 DIAGNOSIS — M79674 Pain in right toe(s): Secondary | ICD-10-CM

## 2021-07-31 DIAGNOSIS — M79675 Pain in left toe(s): Secondary | ICD-10-CM

## 2021-07-31 DIAGNOSIS — M2041 Other hammer toe(s) (acquired), right foot: Secondary | ICD-10-CM | POA: Diagnosis not present

## 2021-07-31 DIAGNOSIS — E1142 Type 2 diabetes mellitus with diabetic polyneuropathy: Secondary | ICD-10-CM

## 2021-07-31 DIAGNOSIS — M2030 Hallux varus (acquired), unspecified foot: Secondary | ICD-10-CM

## 2021-07-31 DIAGNOSIS — B351 Tinea unguium: Secondary | ICD-10-CM | POA: Diagnosis not present

## 2021-07-31 DIAGNOSIS — M2042 Other hammer toe(s) (acquired), left foot: Secondary | ICD-10-CM

## 2021-07-31 NOTE — Progress Notes (Signed)
This patient returns to my office for at risk foot care.  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  This patient is unable to cut nails himself since the patient cannot reach his nails.These nails are painful walking and wearing shoes.  This patient presents for at risk foot care today.  General Appearance  Alert, conversant and in no acute stress.  Vascular  Dorsalis pedis and posterior tibial  pulses are palpable  bilaterally.  Capillary return is within normal limits  bilaterally. Temperature is within normal limits  bilaterally.  Neurologic  Senn-Weinstein monofilament wire test diminished  bilaterally. Muscle power within normal limits bilaterally.  Nails Thick disfigured discolored nails with subungual debris  from hallux to fifth toes bilaterally. No evidence of bacterial infection or drainage bilaterally.  Orthopedic  No limitations of motion  feet .  No crepitus or effusions noted.  No bony pathology or digital deformities noted.  Hallux malleus  Hammer toes  B/L.  Skin  normotropic skin with no porokeratosis noted bilaterally.  No signs of infections or ulcers noted.     Onychomycosis  Pain in right toes  Pain in left toes  Consent was obtained for treatment procedures.   Mechanical debridement of nails 1-5  bilaterally performed with a nail nipper.  Filed with dremel without incident.  Patient qualifies for diabetic shoes due to DPN, hallux malleus and hammer toes.  Return office visit   3 months                   Told patient to return for periodic foot care and evaluation due to potential at risk complications.   Gardiner Barefoot DPM

## 2021-08-01 ENCOUNTER — Ambulatory Visit: Payer: Medicare Other | Attending: Family Medicine

## 2021-08-01 ENCOUNTER — Other Ambulatory Visit: Payer: Self-pay

## 2021-08-01 DIAGNOSIS — M6281 Muscle weakness (generalized): Secondary | ICD-10-CM | POA: Diagnosis present

## 2021-08-01 DIAGNOSIS — R2689 Other abnormalities of gait and mobility: Secondary | ICD-10-CM | POA: Diagnosis present

## 2021-08-01 DIAGNOSIS — R278 Other lack of coordination: Secondary | ICD-10-CM

## 2021-08-01 DIAGNOSIS — R262 Difficulty in walking, not elsewhere classified: Secondary | ICD-10-CM | POA: Diagnosis present

## 2021-08-01 DIAGNOSIS — R2681 Unsteadiness on feet: Secondary | ICD-10-CM | POA: Insufficient documentation

## 2021-08-01 DIAGNOSIS — R269 Unspecified abnormalities of gait and mobility: Secondary | ICD-10-CM | POA: Diagnosis present

## 2021-08-01 NOTE — Therapy (Signed)
[place] MAIN [facility] SERVICES [address] [place], [place], [address] Phone: [phone]   Fax:  [phone]  Physical Therapy Treatment  Patient Details  Name: Elijah Oliver MRN: [id number] Date of Birth: [birth year] Referring Provider (PT): Dion Body, MD   Encounter Date: 08/01/2021   PT End of Session - 08/01/21 0951     Visit Number 23    Number of Visits 16    Date for PT Re-Evaluation 09/24/21    Authorization Type Traditional Medicare A&B    Authorization Time Period Cert: 1/94/1740-07/13/4817; Recert 5/63-14/97    Progress Note Due on Visit 30    PT Start Time 0934    PT Stop Time 1014    PT Time Calculation (min) 40 min    Equipment Utilized During Treatment Gait belt    Activity Tolerance Patient tolerated treatment well;No increased pain    Behavior During Therapy [facility] for tasks assessed/performed             Past Medical History:  Diagnosis Date   Backache 07/23/2017   Benign essential hypertension 07/23/2017   Bladder cancer ([place]) 07/23/2017   Cellulitis of great toe of left foot 07/23/2017   CKD (chronic kidney disease) stage 3, GFR 30-59 ml/min ([place]) 07/06/2017   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   Overview:  Sequential left internal mammary to the mid LAD and distal LAD, SVG to the second obtuse marginal and SVG to the right coronary artery. June 24th, 2006 Dr. 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 07/23/2017   Neck sprain 07/23/2017   Neoplasm of uncertain behavior of skin 07/23/2017   Nephrolithiasis 07/23/2017   Pure hypercholesterolemia 07/02/2017   Seizure disorder ([place]) 07/02/2017   Shoulder strain 07/23/2017   Sleep apnea 07/23/2017   SOB (shortness of breath) 07/23/2017   Thoracic sprain 07/23/2017   Type 2 diabetes mellitus with diabetic neuropathy, without long-term current use of insulin ([place]) 07/02/2017   Urge  incontinence of urine 07/23/2017    Past Surgical History:  Procedure Laterality Date   CARDIAC SURGERY  2006   PROSTATECTOMY  2004   REPLACEMENT TOTAL KNEE  2016   TRANSURETHRAL RESECTION OF BLADDER TUMOR WITH GYRUS (TURBT-GYRUS)  2010?   URETEROSCOPY WITH HOLMIUM LASER LITHOTRIPSY  2010?    There were no vitals filed for this visit.   Subjective Assessment - 08/01/21 0945     Subjective Pt doing well today no updates this date. Pt still working on HEP daily.    Pertinent History Pt is a pleasant 80 y/o male presenting to PT without an AD and with c/o impaired balance and gait disturbance that started over 4 years ago after his wife passed away from [place]. Per chart pt PMH includes: HTN, HLD, obesity, functional incontinence, pure hypercholesteroliemia, DMII, seizure disorder, CAD involving coronary bypass graft of native heart without angina pectoris (2006), hx of prostate CA, hx bladder CA, CKD, sleep apnea, SOB, knee replacement, per pt he has neuropathy in hands and feet    Limitations Standing;Walking;Lifting;House hold activities;Writing    Currently in Pain? No/denies             Intervention this date: -666ft overground AMB, 4:12, supervision level, no LOB (0.21m/s)  -5xSTS 1 foot on airex, 1x bilat -10xSTS airex under butt and feet *rest -Eyes closed on foam, normal stance, minguardA: 25x3secH  -Hedgehog tapping x10 minutes (step staps extensive training on  education on using a lateral step strategy to recover LOB)     PT Short Term Goals - 07/30/21 0855       PT SHORT TERM GOAL #1   Title Pt will be independent with HEP in order to improve strength and balance in order to decrease fall risk and improve function at home and work.    Baseline 5/25: to be initiated next session; 6/27: pt indep with HEP    Time 6    Period Weeks    Status Achieved    Target Date 06/04/21               PT Long Term Goals - 07/30/21 0855       PT LONG TERM GOAL #1   Title  Patient will increase Berg Balance score by > 6 points to demonstrate decreased fall risk during functional activities.    Baseline 5/25: 34/56; 6/27: 44/56    Time 12    Period Weeks    Status Achieved      PT LONG TERM GOAL #2   Title Pt will improve DGI by at least 3 points in order to demonstrate clinically significant improvement in balance and decreased risk for falls.    Baseline 5/25: 18/24; 6/27: 21/24    Time 12    Period Weeks    Status Achieved      PT LONG TERM GOAL #3   Title Pt will improve ABC by at least 13% in order to demonstrate clinically significant improvement in balance confidence.    Baseline 5/25: 66.87; 6/27: 70.63%; 8/3: 63.14%    Time 12    Period Weeks    Status On-going    Target Date 09/24/21      PT LONG TERM GOAL #4   Title Patient will increase 10 meter walk test to >1.75m/s as to improve gait speed for better community ambulation and to reduce fall risk.    Baseline 5/25: 0.86 m/s; 6/27: 0.97 m/s; 8/3: 1.09 m/s    Time 12    Period Weeks    Status Achieved      PT LONG TERM GOAL #5   Title Patient will increase FOTO score to equal to or greater than 69  to demonstrate statistically significant improvement in mobility and quality of life.    Baseline 5/25: 59; 6/27: 57; 8/3: 55 (pt reports questions not fully relevant to his problem)    Time 12    Period Weeks    Status On-going    Target Date 09/24/21      PT LONG TERM GOAL #6   Title Pt will demonstrate ability to maintain SLB for at least 5 seconds on each LE in order to improve ability to safely navigate up/down curbs and over obstacles.    Baseline 6/27: Pt unable to maintain SLB on either LE; 8/3: LLE 7 sec  RLE 2-3 sec    Time 12    Period Weeks    Status Partially Met    Target Date 09/24/21                   Plan - 08/01/21 8453     Clinical Impression Statement Continue with plan of care as laid out in evaluation and prior treatments.  Spent a sizable portion of  session working on proprioceptive motor control training of the trunk both and static scenarios and dynamic scenarios I can do transfers sitting to/from standing.  Finished the session with a static  toe tapping exercise purposefully provoking loss of balance and heavy emphasis on teaching lateral stepping strategy for loss of balance recovery.  Patient is receptive to educational intervention, however his falls anxiety makes it difficult to decrease use of bilateral uppers and recovery of balance.  Patient will continue to benefit from skilled PT intervention to reduce risk of falls and to maximize ability to participate in IADL and social engagement.    Personal Factors and Comorbidities Age;Fitness;Time since onset of injury/illness/exacerbation;Comorbidity 1;Comorbidity 2;Comorbidity 3+    Comorbidities pertinent per chart/pt: DMII, impaired sensation LEs, OA with hx of knee replacement, neuropathy, HTN    Examination-Activity Limitations Bathing;Carry;Lift;Stand;Locomotion Level;Transfers;Hygiene/Grooming;Reach Overhead;Stairs;Bend;Dressing;Squat    Examination-Participation Restrictions Community Activity;Shop;Volunteer;Cleaning;Yard Work;Church;Laundry;Meal Prep    Stability/Clinical Decision Making Stable/Uncomplicated    Clinical Decision Making Low    Rehab Potential Good    PT Frequency 2x / week    PT Duration 8 weeks    PT Treatment/Interventions ADLs/Self Care Home Management;Moist Heat;Cryotherapy;DME Instruction;Gait training;Stair training;Functional mobility training;Therapeutic activities;Therapeutic exercise;Balance training;Neuromuscular re-education;Patient/family education;Orthotic Fit/Training;Manual techniques;Passive range of motion;Energy conservation;Splinting;Dry needling;Taping;Visual/perceptual remediation/compensation;Biofeedback    PT Next Visit Plan Continues with strengthening and gait based balance and motor control training, half foam under heels balance/coordination  exercise, balance tasks that rely on vesetibular cues; balance tasks involving active knee flexion to ext., progress resisted PF exercises in sitting; dynamic balance with quick turns, continue POC as previously indicated    PT Home Exercise Plan No updates today; issued thera putty (green) for improved grip strength to increase safety with challenging balance tasks; 8/3: Access Code: [email or web address]    Consulted and Agree with Plan of Care Patient             Patient will benefit from skilled therapeutic intervention in order to improve the following deficits and impairments:  Abnormal gait, Decreased activity tolerance, Improper body mechanics, Impaired sensation, Decreased balance, Decreased coordination, Decreased mobility, Difficulty walking, Impaired vision/preception, Postural dysfunction, Decreased strength, Decreased range of motion, Decreased endurance, Hypomobility, Impaired flexibility, Pain  Visit Diagnosis: Unsteadiness on feet  Other abnormalities of gait and mobility  Muscle weakness (generalized)  Other lack of coordination     Problem List Patient Active Problem List   Diagnosis Date Noted   Systolic murmur 94/49/6759   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   Adenocarcinoma of prostate ([place]) 12/01/2018   Cellulitis 12/01/2018   Dysarthria 12/01/2018   HTN (hypertension) 12/01/2018   Other hyperlipidemia 12/01/2018   DNR (do not resuscitate) 03/07/2018   Urge incontinence of urine 07/23/2017   Thoracic sprain 07/23/2017   Stye 07/23/2017   SOB (shortness of breath) 07/23/2017   Sleep apnea 07/23/2017   Shoulder strain 07/23/2017   Nephrolithiasis 07/23/2017   Neoplasm of uncertain behavior of skin 07/23/2017   Neck sprain 07/23/2017   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 07/23/2017   Leg swelling 07/23/2017   Ingrowing nail with infection 07/23/2017   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   DM (diabetes mellitus) ([place]) 07/23/2017   Bladder cancer ([place]) 07/23/2017   Benign essential  hypertension 07/23/2017   Backache 07/23/2017   Cellulitis of great toe of left foot 07/23/2017   Seizures ([place]) 07/13/2017   CKD (chronic kidney disease) stage 3, GFR 30-59 ml/min ([facility]) 07/06/2017   Type 2 diabetes mellitus with diabetic neuropathy, without long-term current use of insulin ([place]) 07/02/2017   Seizure disorder ([place]) 07/02/2017   Pure hypercholesterolemia 07/02/2017   Obesity (BMI 30.0-34.9) 07/02/2017   History of prostate cancer 07/02/2017   Coronary artery disease involving coronary bypass graft of  native heart without angina pectoris 07/02/2017   10:50 AM, 08/01/21 Etta Grandchild, PT, DPT Physical Therapist - [facility]  Outpatient Physical [place] [phone]     Etta Grandchild 08/01/2021, 10:46 AM  [place] MAIN [facility] SERVICES [address] [place], [place], [address] Phone: [phone]   Fax:  [phone]  Name: KIYON FIDALGO MRN: [id number] Date of Birth: [birth year]

## 2021-08-06 ENCOUNTER — Ambulatory Visit: Payer: Medicare Other

## 2021-08-06 ENCOUNTER — Other Ambulatory Visit: Payer: Self-pay

## 2021-08-06 DIAGNOSIS — R278 Other lack of coordination: Secondary | ICD-10-CM

## 2021-08-06 DIAGNOSIS — R2689 Other abnormalities of gait and mobility: Secondary | ICD-10-CM

## 2021-08-06 DIAGNOSIS — R2681 Unsteadiness on feet: Secondary | ICD-10-CM

## 2021-08-06 NOTE — Therapy (Signed)
[place] MAIN [facility] SERVICES [address] [place], [place], [address] Phone: [phone]   Fax:  [phone]  Physical Therapy Treatment  Patient Details  Name: Elijah Oliver MRN: [id number] Date of Birth: Nov 14, 1941 Referring Provider (PT): Dion Body, MD   Encounter Date: 08/06/2021   PT End of Session - 08/06/21 0944     Visit Number 24    Number of Visits 75    Date for PT Re-Evaluation 09/24/21    Authorization Type Traditional Medicare A&B    Authorization Time Period Cert: 07/08/9832-07/24/538; Recert 7/67-34/19    Progress Note Due on Visit 30    PT Start Time 0803    PT Stop Time 0844    PT Time Calculation (min) 41 min    Equipment Utilized During Treatment Gait belt    Activity Tolerance Patient tolerated treatment well;No increased pain    Behavior During Therapy [facility] for tasks assessed/performed             Past Medical History:  Diagnosis Date   Backache 07/23/2017   Benign essential hypertension 07/23/2017   Bladder cancer ([place]) 07/23/2017   Cellulitis of great toe of left foot 07/23/2017   CKD (chronic kidney disease) stage 3, GFR 30-59 ml/min ([place]) 07/06/2017   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   Overview:  Sequential left internal mammary to the mid LAD and distal LAD, SVG to the second obtuse marginal and SVG to the right coronary artery. June 24th, 2006 Dr. 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 07/23/2017   Neck sprain 07/23/2017   Neoplasm of uncertain behavior of skin 07/23/2017   Nephrolithiasis 07/23/2017   Pure hypercholesterolemia 07/02/2017   Seizure disorder ([place]) 07/02/2017   Shoulder strain 07/23/2017   Sleep apnea 07/23/2017   SOB (shortness of breath) 07/23/2017   Thoracic sprain 07/23/2017   Type 2 diabetes mellitus with diabetic neuropathy, without long-term current use of insulin ([place]) 07/02/2017   Urge  incontinence of urine 07/23/2017    Past Surgical History:  Procedure Laterality Date   CARDIAC SURGERY  2006   PROSTATECTOMY  2004   REPLACEMENT TOTAL KNEE  2016   TRANSURETHRAL RESECTION OF BLADDER TUMOR WITH GYRUS (TURBT-GYRUS)  2010?   URETEROSCOPY WITH HOLMIUM LASER LITHOTRIPSY  2010?    There were no vitals filed for this visit.   Subjective Assessment - 08/06/21 0803     Subjective Patient reports some acid reflux and mild left shoulder pain since prior visit. No falls.    Pertinent History Pt is a pleasant 80 y/o male presenting to PT without an AD and with c/o impaired balance and gait disturbance that started over 4 years ago after his wife passed away from [place]. Per chart pt PMH includes: HTN, HLD, obesity, functional incontinence, pure hypercholesteroliemia, DMII, seizure disorder, CAD involving coronary bypass graft of native heart without angina pectoris (2006), hx of prostate CA, hx bladder CA, CKD, sleep apnea, SOB, knee replacement, per pt he has neuropathy in hands and feet    Limitations Standing;Walking;Lifting;House hold activities;Writing    Currently in Pain? Yes    Pain Location Shoulder    Pain Orientation Left            Neuro Re-ed   Instructs pt through warm-up to promote improved performance with balance exercises, motor control ABCs - 1 round each  Ankle pumps 20x LAQ 20x  // bars: with CGA-min  a throughout Reactive postural control, perturbation training, focus on anterior and posterior direction 2 rounds 10 times each; rates medium 2 round reactive postural control with perturbation training laterally 10 times each direction, greater difficulty with LLE reactive control to L side.  STS 10x with posterior perturbation, pt with only one instance of poor eccentric control back into chair.   On airex: Contact guard assist to min assist throughout NBOS, EO 60 sec NBOS EO, with head turns (horizontal, vertical) 10x each NBOS, EC sec Intermittent UE  support throughout    PT provides cuing throughout session primarily in the form of VC/TC/demo to facilitate movement at target joints and improved muscle activation. Pt with good technique following cuing     Assessment: Continued focus on pertubation training.  Patient has greatest difficulty with left lateral reactive postural control and stepping strategies.  Patient shows improved stability with narrow base of support eyes open on compliant surface, but is still very challenged with head turns.  Patient will benefit from further skilled therapy to improve balance to decrease fall risk     PT Education - 08/06/21 0944     Education Details Exercise technique, body mechanics, reactive postural control step strategies    Person(s) Educated Patient    Methods Explanation;Demonstration;Tactile cues;Verbal cues    Comprehension Verbalized understanding;Returned demonstration;Verbal cues required;Need further instruction              PT Short Term Goals - 07/30/21 0855       PT SHORT TERM GOAL #1   Title Pt will be independent with HEP in order to improve strength and balance in order to decrease fall risk and improve function at home and work.    Baseline 5/25: to be initiated next session; 6/27: pt indep with HEP    Time 6    Period Weeks    Status Achieved    Target Date 06/04/21               PT Long Term Goals - 07/30/21 0855       PT LONG TERM GOAL #1   Title Patient will increase Berg Balance score by > 6 points to demonstrate decreased fall risk during functional activities.    Baseline 5/25: 34/56; 6/27: 44/56    Time 12    Period Weeks    Status Achieved      PT LONG TERM GOAL #2   Title Pt will improve DGI by at least 3 points in order to demonstrate clinically significant improvement in balance and decreased risk for falls.    Baseline 5/25: 18/24; 6/27: 21/24    Time 12    Period Weeks    Status Achieved      PT LONG TERM GOAL #3   Title Pt will  improve ABC by at least 13% in order to demonstrate clinically significant improvement in balance confidence.    Baseline 5/25: 66.87; 6/27: 70.63%; 8/3: 63.14%    Time 12    Period Weeks    Status On-going    Target Date 09/24/21      PT LONG TERM GOAL #4   Title Patient will increase 10 meter walk test to >1.61ms as to improve gait speed for better community ambulation and to reduce fall risk.    Baseline 5/25: 0.86 m/s; 6/27: 0.97 m/s; 8/3: 1.09 m/s    Time 12    Period Weeks    Status Achieved      PT LONG TERM GOAL #5  Title Patient will increase FOTO score to equal to or greater than 69  to demonstrate statistically significant improvement in mobility and quality of life.    Baseline 5/25: 59; 6/27: 57; 8/3: 55 (pt reports questions not fully relevant to his problem)    Time 12    Period Weeks    Status On-going    Target Date 09/24/21      PT LONG TERM GOAL #6   Title Pt will demonstrate ability to maintain SLB for at least 5 seconds on each LE in order to improve ability to safely navigate up/down curbs and over obstacles.    Baseline 6/27: Pt unable to maintain SLB on either LE; 8/3: LLE 7 sec  RLE 2-3 sec    Time 12    Period Weeks    Status Partially Met    Target Date 09/24/21                   Plan - 08/06/21 0946     Clinical Impression Statement Continued focus on pertubation training.  Patient has greatest difficulty with left lateral reactive postural control and stepping strategies.  Patient shows improved stability with narrow base of support eyes open on compliant surface, but is still very challenged with head turns.  Patient will benefit from further skilled therapy to improve balance to decrease fall risk    Personal Factors and Comorbidities Age;Fitness;Time since onset of injury/illness/exacerbation;Comorbidity 1;Comorbidity 2;Comorbidity 3+    Comorbidities pertinent per chart/pt: DMII, impaired sensation LEs, OA with hx of knee replacement,  neuropathy, HTN    Examination-Activity Limitations Bathing;Carry;Lift;Stand;Locomotion Level;Transfers;Hygiene/Grooming;Reach Overhead;Stairs;Bend;Dressing;Squat    Examination-Participation Restrictions Community Activity;Shop;Volunteer;Cleaning;Yard Work;Church;Laundry;Meal Prep    Stability/Clinical Decision Making Stable/Uncomplicated    Rehab Potential Good    PT Frequency 2x / week    PT Duration 8 weeks    PT Treatment/Interventions ADLs/Self Care Home Management;Moist Heat;Cryotherapy;DME Instruction;Gait training;Stair training;Functional mobility training;Therapeutic activities;Therapeutic exercise;Balance training;Neuromuscular re-education;Patient/family education;Orthotic Fit/Training;Manual techniques;Passive range of motion;Energy conservation;Splinting;Dry needling;Taping;Visual/perceptual remediation/compensation;Biofeedback    PT Next Visit Plan Continues with strengthening and gait based balance and motor control training, half foam under heels balance/coordination exercise, balance tasks that rely on vesetibular cues; balance tasks involving active knee flexion to ext., progress resisted PF exercises in sitting; dynamic balance with quick turns, perturbation training with focus to left side    PT Home Exercise Plan No updates today; issued thera putty (green) for improved grip strength to increase safety with challenging balance tasks; 8/3: Access Code: [email or web address]    Consulted and Agree with Plan of Care Patient             Patient will benefit from skilled therapeutic intervention in order to improve the following deficits and impairments:  Abnormal gait, Decreased activity tolerance, Improper body mechanics, Impaired sensation, Decreased balance, Decreased coordination, Decreased mobility, Difficulty walking, Impaired vision/preception, Postural dysfunction, Decreased strength, Decreased range of motion, Decreased endurance, Hypomobility, Impaired flexibility, Pain  Visit  Diagnosis: Unsteadiness on feet  Other abnormalities of gait and mobility  Other lack of coordination     Problem List Patient Active Problem List   Diagnosis Date Noted   Systolic murmur 03/47/4259   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   Cellulitis 12/01/2018   Dysarthria 12/01/2018   HTN (hypertension) 12/01/2018   Other hyperlipidemia 12/01/2018   DNR (do not resuscitate) 03/07/2018   Urge incontinence of urine 07/23/2017   Thoracic sprain 07/23/2017   Stye 07/23/2017   SOB (shortness of  breath) 07/23/2017   Sleep apnea 07/23/2017   Shoulder strain 07/23/2017   Nephrolithiasis 07/23/2017   Neoplasm of uncertain behavior of skin 07/23/2017   Neck sprain 07/23/2017   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 07/23/2017   Leg swelling 07/23/2017   Ingrowing nail with infection 07/23/2017   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   DM (diabetes mellitus) ([place]) 07/23/2017   Bladder cancer ([place]) 07/23/2017   Benign essential hypertension 07/23/2017   Backache 07/23/2017   Cellulitis of great toe of left foot 07/23/2017   Seizures ([place]) 07/13/2017   CKD (chronic kidney disease) stage 3, GFR 30-59 ml/min ([facility]) 07/06/2017   Type 2 diabetes mellitus with diabetic neuropathy, without long-term current use of insulin ([place]) 07/02/2017   Seizure disorder ([place]) 07/02/2017   Pure hypercholesterolemia 07/02/2017   Obesity (BMI 30.0-34.9) 07/02/2017   History of prostate cancer 07/02/2017   Coronary artery disease involving coronary bypass graft of native heart without angina pectoris 07/02/2017   Ricard Dillon PT, DPT 08/06/2021, 9:47 AM  [place] [address] [place], [place], [address] Phone: [phone]   Fax:  [phone]  Name: MARICE ANGELINO MRN: [id number] Date of Birth: 07/22/1941

## 2021-08-08 ENCOUNTER — Other Ambulatory Visit: Payer: Self-pay

## 2021-08-08 ENCOUNTER — Ambulatory Visit: Payer: Medicare Other

## 2021-08-08 DIAGNOSIS — R2681 Unsteadiness on feet: Secondary | ICD-10-CM

## 2021-08-08 DIAGNOSIS — R278 Other lack of coordination: Secondary | ICD-10-CM

## 2021-08-08 DIAGNOSIS — M6281 Muscle weakness (generalized): Secondary | ICD-10-CM

## 2021-08-08 DIAGNOSIS — R2689 Other abnormalities of gait and mobility: Secondary | ICD-10-CM

## 2021-08-08 NOTE — Therapy (Signed)
[place] [facility] MAIN [facility] SERVICES [address] [place], [place], [address] Phone: [phone]   Fax:  [phone]  Physical Therapy Treatment  Patient Details  Name: Elijah Oliver MRN: [id number] Date of Birth: Nov 13, 1941 Referring Provider (PT): Marisue Ivan, MD   Encounter Date: 08/08/2021   PT End of Session - 08/08/21 0946     Visit Number 25    Number of Visits 38    Date for PT Re-Evaluation 09/24/21    Authorization Type Traditional Medicare A&B    Authorization Time Period Cert: 7/78/8739-9/84/4914; Recert 8/31-10/26    Progress Note Due on Visit 30    PT Start Time 0804    PT Stop Time 0845    PT Time Calculation (min) 41 min    Equipment Utilized During Treatment Gait belt    Activity Tolerance Patient tolerated treatment well;No increased pain    Behavior During Therapy [facility] for tasks assessed/performed             Past Medical History:  Diagnosis Date   Backache 07/23/2017   Benign essential hypertension 07/23/2017   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/23/2017   Cellulitis of great toe of left foot 07/23/2017   CKD (chronic kidney disease) stage 3, GFR 30-59 ml/min ([facility]) 07/06/2017   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   Overview:  Sequential left internal mammary to the mid LAD and distal LAD, SVG to the second obtuse marginal and SVG to the right coronary artery. June 24th, 2006 Dr. Maryruth Hancock   History of prostate cancer 07/02/2017   Ingrowing nail with infection 07/23/2017   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 07/23/2017   Neck sprain 07/23/2017   Neoplasm of uncertain behavior of skin 07/23/2017   Nephrolithiasis 07/23/2017   Pure hypercholesterolemia 07/02/2017   Seizure disorder ([facility]) 07/02/2017   Shoulder strain 07/23/2017   Sleep apnea 07/23/2017   SOB (shortness of breath) 07/23/2017   Thoracic sprain 07/23/2017   Type 2 diabetes mellitus with diabetic neuropathy, without long-term current use of insulin ([facility]) 07/02/2017   Urge  incontinence of urine 07/23/2017    Past Surgical History:  Procedure Laterality Date   CARDIAC SURGERY  2006   PROSTATECTOMY  2004   REPLACEMENT TOTAL KNEE  2016   TRANSURETHRAL RESECTION OF BLADDER TUMOR WITH GYRUS (TURBT-GYRUS)  2010?   URETEROSCOPY WITH HOLMIUM LASER LITHOTRIPSY  2010?    There were no vitals filed for this visit.   Subjective Assessment - 08/08/21 0803     Subjective Pt reports he almost fell when he got up from sleeping in his recliner. Pt reports he reached for the wall to prevent fall. Pt states some continued mild L shoulder pain. He is going to discuss with his doctor if it continues hurting.    Pertinent History Pt is a pleasant 80 y/o male presenting to PT without an AD and with c/o impaired balance and gait disturbance that started over 4 years ago after his wife passed away from [place]. Per chart pt PMH includes: HTN, HLD, obesity, functional incontinence, pure hypercholesteroliemia, DMII, seizure disorder, CAD involving coronary bypass graft of native heart without angina pectoris (2006), hx of prostate CA, hx bladder CA, CKD, sleep apnea, SOB, knee replacement, per pt he has neuropathy in hands and feet    Limitations Standing;Walking;Lifting;House hold activities;Writing    Currently in Pain? Yes    Pain Location Shoulder    Pain Orientation Left             Interventions  Instructs pt through warm-up to promote improved performance with balance exercises, motor control ABCs - 1 round each  Ankle pumps 20x LAQ 20x   Nustep level 3 (seat 11) for LE mm and cardiorespiratory endurance - x 5 min. Rates challenging. SPM remains >80 throughout.  Close contact-guard assist for dismount due to postural instability.  STS 10x cuing for technique (UE/LE and trunk positioning), CGA.  Patient with tendency to utilize increased posterior weight shift.  After cueing did improve technique and postural stability with exercise.  // bars: with CGA-min a  throughout  Standing on half foam: In DF: 60 sec In PF: 60 sec In DF with horizontal head turns 10x each direction (greater difficulty turning head to R side) In PF with horizontal head turns 10x each direction (greater difficulty turning head to R)  Reactive postural control, perturbation training, primary focus on L lateral direction 2x15-18 Pt rates exercise hard.   PT provides cuing throughout session primarily in the form of VC/TC/demo to facilitate movement at target joints and improved muscle activation. Pt with good technique following cuing     Note: Portions of this document were prepared using Dragon voice recognition software and although reviewed may contain unintentional dictation errors in syntax, grammar, or spelling.      PT Education - 08/08/21 0946     Education Details Exercise technique, body mechanics, continued instruction with reactive postural control step strategies    Person(s) Educated Patient    Methods Explanation;Demonstration;Tactile cues;Verbal cues    Comprehension Verbalized understanding;Returned demonstration;Need further instruction              PT Short Term Goals - 07/30/21 0855       PT SHORT TERM GOAL #1   Title Pt will be independent with HEP in order to improve strength and balance in order to decrease fall risk and improve function at home and work.    Baseline 5/25: to be initiated next session; 6/27: pt indep with HEP    Time 6    Period Weeks    Status Achieved    Target Date 06/04/21               PT Long Term Goals - 07/30/21 0855       PT LONG TERM GOAL #1   Title Patient will increase Berg Balance score by > 6 points to demonstrate decreased fall risk during functional activities.    Baseline 5/25: 34/56; 6/27: 44/56    Time 12    Period Weeks    Status Achieved      PT LONG TERM GOAL #2   Title Pt will improve DGI by at least 3 points in order to demonstrate clinically significant improvement in balance  and decreased risk for falls.    Baseline 5/25: 18/24; 6/27: 21/24    Time 12    Period Weeks    Status Achieved      PT LONG TERM GOAL #3   Title Pt will improve ABC by at least 13% in order to demonstrate clinically significant improvement in balance confidence.    Baseline 5/25: 66.87; 6/27: 70.63%; 8/3: 63.14%    Time 12    Period Weeks    Status On-going    Target Date 09/24/21      PT LONG TERM GOAL #4   Title Patient will increase 10 meter walk test to >1.62m/s as to improve gait speed for better community ambulation and to reduce fall risk.    Baseline  5/25: 0.86 m/s; 6/27: 0.97 m/s; 8/3: 1.09 m/s    Time 12    Period Weeks    Status Achieved      PT LONG TERM GOAL #5   Title Patient will increase FOTO score to equal to or greater than 69  to demonstrate statistically significant improvement in mobility and quality of life.    Baseline 5/25: 59; 6/27: 57; 8/3: 55 (pt reports questions not fully relevant to his problem)    Time 12    Period Weeks    Status On-going    Target Date 09/24/21      PT LONG TERM GOAL #6   Title Pt will demonstrate ability to maintain SLB for at least 5 seconds on each LE in order to improve ability to safely navigate up/down curbs and over obstacles.    Baseline 6/27: Pt unable to maintain SLB on either LE; 8/3: LLE 7 sec  RLE 2-3 sec    Time 12    Period Weeks    Status Partially Met    Target Date 09/24/21                   Plan - 08/08/21 0947     Clinical Impression Statement Focus on left side pertubation training for increased step-length and increased reaction speed of lateral step strategy.  Patient did show improvement within session but still struggles with absent or delayed left lateral step.  Patient exhibited improved postural stability with half foam exercises, indicating maintenance of gains throughout therapy. The patient will benefit from further skilled PT to continue to improve lower extremity strength and balance  to decrease fall risk    Personal Factors and Comorbidities Age;Fitness;Time since onset of injury/illness/exacerbation;Comorbidity 1;Comorbidity 2;Comorbidity 3+    Comorbidities pertinent per chart/pt: DMII, impaired sensation LEs, OA with hx of knee replacement, neuropathy, HTN    Examination-Activity Limitations Bathing;Carry;Lift;Stand;Locomotion Level;Transfers;Hygiene/Grooming;Reach Overhead;Stairs;Bend;Dressing;Squat    Examination-Participation Restrictions Community Activity;Shop;Volunteer;Cleaning;Yard Work;Church;Laundry;Meal Prep    Stability/Clinical Decision Making Stable/Uncomplicated    Rehab Potential Good    PT Frequency 2x / week    PT Duration 8 weeks    PT Treatment/Interventions ADLs/Self Care Home Management;Moist Heat;Cryotherapy;DME Instruction;Gait training;Stair training;Functional mobility training;Therapeutic activities;Therapeutic exercise;Balance training;Neuromuscular re-education;Patient/family education;Orthotic Fit/Training;Manual techniques;Passive range of motion;Energy conservation;Splinting;Dry needling;Taping;Visual/perceptual remediation/compensation;Biofeedback    PT Next Visit Plan Continues with strengthening and gait based balance and motor control training, half foam under heels balance/coordination exercise, balance tasks that rely on vesetibular cues; balance tasks involving active knee flexion to ext., progress resisted PF exercises in sitting; dynamic balance with quick turns, perturbation training with focus to left side, continue plan of care    PT Home Exercise Plan No updates today; issued thera putty (green) for improved grip strength to increase safety with challenging balance tasks; 8/3: Access Code: [email or web address]    Consulted and Agree with Plan of Care Patient             Patient will benefit from skilled therapeutic intervention in order to improve the following deficits and impairments:  Abnormal gait, Decreased activity tolerance, Improper  body mechanics, Impaired sensation, Decreased balance, Decreased coordination, Decreased mobility, Difficulty walking, Impaired vision/preception, Postural dysfunction, Decreased strength, Decreased range of motion, Decreased endurance, Hypomobility, Impaired flexibility, Pain  Visit Diagnosis: Unsteadiness on feet  Other abnormalities of gait and mobility  Other lack of coordination  Muscle weakness (generalized)     Problem List Patient Active Problem List   Diagnosis Date Noted   Systolic murmur 65/99/3570  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   Cellulitis 12/01/2018   Dysarthria 12/01/2018   HTN (hypertension) 12/01/2018   Other hyperlipidemia 12/01/2018   DNR (do not resuscitate) 03/07/2018   Urge incontinence of urine 07/23/2017   Thoracic sprain 07/23/2017   Stye 07/23/2017   SOB (shortness of breath) 07/23/2017   Sleep apnea 07/23/2017   Shoulder strain 07/23/2017   Nephrolithiasis 07/23/2017   Neoplasm of uncertain behavior of skin 07/23/2017   Neck sprain 07/23/2017   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 07/23/2017   Leg swelling 07/23/2017   Ingrowing nail with infection 07/23/2017   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   DM (diabetes mellitus) ([place]) 07/23/2017   Bladder cancer ([place]) 07/23/2017   Benign essential hypertension 07/23/2017   Backache 07/23/2017   Cellulitis of great toe of left foot 07/23/2017   Seizures ([place]) 07/13/2017   CKD (chronic kidney disease) stage 3, GFR 30-59 ml/min (HCC) 07/06/2017   Type 2 diabetes mellitus with diabetic neuropathy, without long-term current use of insulin ([place]) 07/02/2017   Seizure disorder ([place]) 07/02/2017   Pure hypercholesterolemia 07/02/2017   Obesity (BMI 30.0-34.9) 07/02/2017   History of prostate cancer 07/02/2017   Coronary artery disease involving coronary bypass graft of native heart without angina pectoris 07/02/2017    Zollie Pee, PT 08/08/2021, 9:51 AM  [place] MAIN [facility] SERVICES [address] [place], [place], [address] Phone: [phone]   Fax:  [phone]  Name: Elijah Oliver MRN: [id number] Date of Birth: 1941/11/16

## 2021-08-11 ENCOUNTER — Other Ambulatory Visit: Payer: Self-pay

## 2021-08-11 ENCOUNTER — Ambulatory Visit: Payer: Medicare Other

## 2021-08-11 DIAGNOSIS — R2681 Unsteadiness on feet: Secondary | ICD-10-CM

## 2021-08-11 DIAGNOSIS — M6281 Muscle weakness (generalized): Secondary | ICD-10-CM

## 2021-08-11 DIAGNOSIS — R278 Other lack of coordination: Secondary | ICD-10-CM

## 2021-08-11 DIAGNOSIS — R2689 Other abnormalities of gait and mobility: Secondary | ICD-10-CM

## 2021-08-11 NOTE — Therapy (Signed)
[place] MAIN [facility] SERVICES [address] [place], [place], [address] Phone: [phone]   Fax:  [phone]  Physical Therapy Treatment  Patient Details  Name: Elijah Oliver MRN: [id number] Date of Birth: 1941/05/13 Referring Provider (PT): Dion Body, MD   Encounter Date: 08/11/2021   PT End of Session - 08/11/21 0831     Visit Number 26    Number of Visits 38    Date for PT Re-Evaluation 09/24/21    Authorization Type Traditional Medicare A&B    Authorization Time Period Cert: 1/44/8185-6/31/4970; Recert 2/63-78/58    Progress Note Due on Visit 30    PT Start Time 0808    PT Stop Time 0843    PT Time Calculation (min) 35 min    Equipment Utilized During Treatment Gait belt    Activity Tolerance Patient tolerated treatment well;No increased pain    Behavior During Therapy [facility] for tasks assessed/performed             Past Medical History:  Diagnosis Date   Backache 07/23/2017   Benign essential hypertension 07/23/2017   Bladder cancer ([place]) 07/23/2017   Cellulitis of great toe of left foot 07/23/2017   CKD (chronic kidney disease) stage 3, GFR 30-59 ml/min ([place]) 07/06/2017   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   Overview:  Sequential left internal mammary to the mid LAD and distal LAD, SVG to the second obtuse marginal and SVG to the right coronary artery. June 24th, 2006 Dr. 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 07/23/2017   Neck sprain 07/23/2017   Neoplasm of uncertain behavior of skin 07/23/2017   Nephrolithiasis 07/23/2017   Pure hypercholesterolemia 07/02/2017   Seizure disorder ([place]) 07/02/2017   Shoulder strain 07/23/2017   Sleep apnea 07/23/2017   SOB (shortness of breath) 07/23/2017   Thoracic sprain 07/23/2017   Type 2 diabetes mellitus with diabetic neuropathy, without long-term current use of insulin ([place]) 07/02/2017   Urge  incontinence of urine 07/23/2017    Past Surgical History:  Procedure Laterality Date   CARDIAC SURGERY  2006   PROSTATECTOMY  2004   REPLACEMENT TOTAL KNEE  2016   TRANSURETHRAL RESECTION OF BLADDER TUMOR WITH GYRUS (TURBT-GYRUS)  2010?   URETEROSCOPY WITH HOLMIUM LASER LITHOTRIPSY  2010?    There were no vitals filed for this visit.   Subjective Assessment - 08/11/21 0810     Subjective No major changes since last appointment. Pt reports some mild L shoulder pain still.    Pertinent History Pt is a pleasant 80 y/o male presenting to PT without an AD and with c/o impaired balance and gait disturbance that started over 4 years ago after his wife passed away from [place]. Per chart pt PMH includes: HTN, HLD, obesity, functional incontinence, pure hypercholesteroliemia, DMII, seizure disorder, CAD involving coronary bypass graft of native heart without angina pectoris (2006), hx of prostate CA, hx bladder CA, CKD, sleep apnea, SOB, knee replacement, per pt he has neuropathy in hands and feet    Limitations Standing;Walking;Lifting;House hold activities;Writing    Currently in Pain? Yes    Pain Location Shoulder    Pain Orientation Left            Interventions   Nustep level 3-4 (seat 11) for LE mm and cardiorespiratory endurance - x 5 min. Rates challenging. SPM remains >70s-80 throughout.  Close contact-guard assist for dismount due to postural instability.  Not using LUE d/t mild L shoulder pain, but pt does use RUE. Pt reports he is going to follow-up regarding shoulder at upcoming doctor's appointment.   Seated heel raises 20x for strengthening and motor control.  PT provides demo and verbal cues for technique.  Seated DF 20x for strengthening and motor control.  PT continues to provide demo and verbal cues for technique.  Seated ankle inversion/eversion 20x for each lower extremity with GTB.  Patient with decreased strength of right ankle compared to left.  In parallel bars:  Contact guard assist provided throughout Standing single leg modified heel raises (with support of contralateral LE on floor) 12x each LE  Mini squats 2x10.  PT demos technique throughout. Neuro- Reactive postural control, perturbation training, primary focus on L lateral direction 3x10.  Pt continues to rate exercise hard. Improved reaction times.    Reactive postural control, perturbation training, posterior direction 1x17, Pt rates exercise hard. Tendency to rely on RLE for step. Delayed LLE reaction.  PT provides cuing throughout session primarily in the form of VC/TC/demo to facilitate movement at target joints and improved muscle activation. Pt with good technique following cuing     Note: Portions of this document were prepared using Dragon voice recognition software and although reviewed may contain unintentional dictation errors in syntax, grammar, or spelling.      PT Education - 08/11/21 0811     Education Details exercise technique, body mechanics    Person(s) Educated Patient    Methods Explanation;Demonstration;Tactile cues;Verbal cues    Comprehension Verbalized understanding;Returned demonstration;Verbal cues required;Need further instruction              PT Short Term Goals - 07/30/21 0855       PT SHORT TERM GOAL #1   Title Pt will be independent with HEP in order to improve strength and balance in order to decrease fall risk and improve function at home and work.    Baseline 5/25: to be initiated next session; 6/27: pt indep with HEP    Time 6    Period Weeks    Status Achieved    Target Date 06/04/21               PT Long Term Goals - 07/30/21 0855       PT LONG TERM GOAL #1   Title Patient will increase Berg Balance score by > 6 points to demonstrate decreased fall risk during functional activities.    Baseline 5/25: 34/56; 6/27: 44/56    Time 12    Period Weeks    Status Achieved      PT LONG TERM GOAL #2   Title Pt will improve DGI by  at least 3 points in order to demonstrate clinically significant improvement in balance and decreased risk for falls.    Baseline 5/25: 18/24; 6/27: 21/24    Time 12    Period Weeks    Status Achieved      PT LONG TERM GOAL #3   Title Pt will improve ABC by at least 13% in order to demonstrate clinically significant improvement in balance confidence.    Baseline 5/25: 66.87; 6/27: 70.63%; 8/3: 63.14%    Time 12    Period Weeks    Status On-going    Target Date 09/24/21      PT LONG TERM GOAL #4   Title Patient will increase 10 meter walk test to >1.13ms as to improve gait speed for better community ambulation and to reduce  fall risk.    Baseline 5/25: 0.86 m/s; 6/27: 0.97 m/s; 8/3: 1.09 m/s    Time 12    Period Weeks    Status Achieved      PT LONG TERM GOAL #5   Title Patient will increase FOTO score to equal to or greater than 69  to demonstrate statistically significant improvement in mobility and quality of life.    Baseline 5/25: 59; 6/27: 57; 8/3: 55 (pt reports questions not fully relevant to his problem)    Time 12    Period Weeks    Status On-going    Target Date 09/24/21      PT LONG TERM GOAL #6   Title Pt will demonstrate ability to maintain SLB for at least 5 seconds on each LE in order to improve ability to safely navigate up/down curbs and over obstacles.    Baseline 6/27: Pt unable to maintain SLB on either LE; 8/3: LLE 7 sec  RLE 2-3 sec    Time 12    Period Weeks    Status Partially Met    Target Date 09/24/21                   Plan - 08/11/21 0854     Clinical Impression Statement Session slightly limited as pt delayed d/t traffic. Pt shows improved reaction time with L lateral step strategy. He is still R side dominant in step strategy for reactive control with posterior perturbation. Pt still cued for increased step-length as well. The pt will benefit from further skilled PT to continue to improve LE strength, reactive postural contral, and  balance to decrease fall risk.    Personal Factors and Comorbidities Age;Fitness;Time since onset of injury/illness/exacerbation;Comorbidity 1;Comorbidity 2;Comorbidity 3+    Comorbidities pertinent per chart/pt: DMII, impaired sensation LEs, OA with hx of knee replacement, neuropathy, HTN    Examination-Activity Limitations Bathing;Carry;Lift;Stand;Locomotion Level;Transfers;Hygiene/Grooming;Reach Overhead;Stairs;Bend;Dressing;Squat    Examination-Participation Restrictions Community Activity;Shop;Volunteer;Cleaning;Yard Work;Church;Laundry;Meal Prep    Stability/Clinical Decision Making Stable/Uncomplicated    Rehab Potential Good    PT Frequency 2x / week    PT Duration 8 weeks    PT Treatment/Interventions ADLs/Self Care Home Management;Moist Heat;Cryotherapy;DME Instruction;Gait training;Stair training;Functional mobility training;Therapeutic activities;Therapeutic exercise;Balance training;Neuromuscular re-education;Patient/family education;Orthotic Fit/Training;Manual techniques;Passive range of motion;Energy conservation;Splinting;Dry needling;Taping;Visual/perceptual remediation/compensation;Biofeedback    PT Next Visit Plan Continues with strengthening and gait based balance and motor control training, half foam under heels balance/coordination exercise, balance tasks that rely on vesetibular cues; balance tasks involving active knee flexion to ext., progress resisted PF exercises in sitting; dynamic balance with quick turns, perturbation training with focus to left side, continue plan of care    PT Home Exercise Plan No updates today; issued thera putty (green) for improved grip strength to increase safety with challenging balance tasks; 8/3: Access Code: [email or web address]    Consulted and Agree with Plan of Care Patient             Patient will benefit from skilled therapeutic intervention in order to improve the following deficits and impairments:  Abnormal gait, Decreased activity tolerance,  Improper body mechanics, Impaired sensation, Decreased balance, Decreased coordination, Decreased mobility, Difficulty walking, Impaired vision/preception, Postural dysfunction, Decreased strength, Decreased range of motion, Decreased endurance, Hypomobility, Impaired flexibility, Pain  Visit Diagnosis: Unsteadiness on feet  Other abnormalities of gait and mobility  Muscle weakness (generalized)  Other lack of coordination     Problem List Patient Active Problem List   Diagnosis Date Noted   Systolic murmur 32/99/2426  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   Cellulitis 12/01/2018   Dysarthria 12/01/2018   HTN (hypertension) 12/01/2018   Other hyperlipidemia 12/01/2018   DNR (do not resuscitate) 03/07/2018   Urge incontinence of urine 07/23/2017   Thoracic sprain 07/23/2017   Stye 07/23/2017   SOB (shortness of breath) 07/23/2017   Sleep apnea 07/23/2017   Shoulder strain 07/23/2017   Nephrolithiasis 07/23/2017   Neoplasm of uncertain behavior of skin 07/23/2017   Neck sprain 07/23/2017   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 07/23/2017   Leg swelling 07/23/2017   Ingrowing nail with infection 07/23/2017   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   DM (diabetes mellitus) ([place]) 07/23/2017   Bladder cancer ([place]) 07/23/2017   Benign essential hypertension 07/23/2017   Backache 07/23/2017   Cellulitis of great toe of left foot 07/23/2017   Seizures ([place]) 07/13/2017   CKD (chronic kidney disease) stage 3, GFR 30-59 ml/min ([facility]) 07/06/2017   Type 2 diabetes mellitus with diabetic neuropathy, without long-term current use of insulin ([place]) 07/02/2017   Seizure disorder ([place]) 07/02/2017   Pure hypercholesterolemia 07/02/2017   Obesity (BMI 30.0-34.9) 07/02/2017   History of prostate cancer 07/02/2017   Coronary artery disease involving coronary bypass graft of native heart without angina pectoris 07/02/2017    Zollie Pee, PT 08/11/2021, 10:44 AM  [place] MAIN [facility] SERVICES [address] [place], [place], [address] Phone: [phone]   Fax:  [phone]  Name: Elijah Oliver MRN: [id number] Date of Birth: [birth year]

## 2021-08-14 ENCOUNTER — Other Ambulatory Visit: Payer: Self-pay

## 2021-08-14 ENCOUNTER — Ambulatory Visit: Payer: Medicare Other

## 2021-08-14 DIAGNOSIS — M6281 Muscle weakness (generalized): Secondary | ICD-10-CM

## 2021-08-14 DIAGNOSIS — R2681 Unsteadiness on feet: Secondary | ICD-10-CM | POA: Diagnosis not present

## 2021-08-14 DIAGNOSIS — R269 Unspecified abnormalities of gait and mobility: Secondary | ICD-10-CM

## 2021-08-14 DIAGNOSIS — R262 Difficulty in walking, not elsewhere classified: Secondary | ICD-10-CM

## 2021-08-14 DIAGNOSIS — R278 Other lack of coordination: Secondary | ICD-10-CM

## 2021-08-14 NOTE — Therapy (Signed)
[place] MAIN [facility] SERVICES [address] [place], [place], [address] Phone: [phone]   Fax:  [phone]  Physical Therapy Treatment  Patient Details  Name: Elijah Oliver MRN: [id number] Date of Birth: Sep 19, 1941 Referring Provider (PT): Dion Body, MD   Encounter Date: 08/14/2021   PT End of Session - 08/14/21 0812     Visit Number 27    Number of Visits 66    Date for PT Re-Evaluation 09/24/21    Authorization Type Traditional Medicare A&B    Authorization Time Period Cert: 03/15/3844-3/64/6803; Recert 2/12-24/82    Progress Note Due on Visit 30    PT Start Time 0802    PT Stop Time 0852    PT Time Calculation (min) 50 min    Equipment Utilized During Treatment Gait belt    Activity Tolerance Patient tolerated treatment well;No increased pain    Behavior During Therapy [facility] for tasks assessed/performed             Past Medical History:  Diagnosis Date   Backache 07/23/2017   Benign essential hypertension 07/23/2017   Bladder cancer ([place]) 07/23/2017   Cellulitis of great toe of left foot 07/23/2017   CKD (chronic kidney disease) stage 3, GFR 30-59 ml/min ([place]) 07/06/2017   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   Overview:  Sequential left internal mammary to the mid LAD and distal LAD, SVG to the second obtuse marginal and SVG to the right coronary artery. June 24th, 2006 Dr. 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 07/23/2017   Neck sprain 07/23/2017   Neoplasm of uncertain behavior of skin 07/23/2017   Nephrolithiasis 07/23/2017   Pure hypercholesterolemia 07/02/2017   Seizure disorder ([place]) 07/02/2017   Shoulder strain 07/23/2017   Sleep apnea 07/23/2017   SOB (shortness of breath) 07/23/2017   Thoracic sprain 07/23/2017   Type 2 diabetes mellitus with diabetic neuropathy, without long-term current use of insulin ([place]) 07/02/2017   Urge  incontinence of urine 07/23/2017    Past Surgical History:  Procedure Laterality Date   CARDIAC SURGERY  2006   PROSTATECTOMY  2004   REPLACEMENT TOTAL KNEE  2016   TRANSURETHRAL RESECTION OF BLADDER TUMOR WITH GYRUS (TURBT-GYRUS)  2010?   URETEROSCOPY WITH HOLMIUM LASER LITHOTRIPSY  2010?    There were no vitals filed for this visit.   Subjective Assessment - 08/14/21 0807     Subjective Patient reports ongoing left shoulder pain and going to MD next Tues.    Pertinent History Pt is a pleasant 80 y/o male presenting to PT without an AD and with c/o impaired balance and gait disturbance that started over 4 years ago after his wife passed away from [place]. Per chart pt PMH includes: HTN, HLD, obesity, functional incontinence, pure hypercholesteroliemia, DMII, seizure disorder, CAD involving coronary bypass graft of native heart without angina pectoris (2006), hx of prostate CA, hx bladder CA, CKD, sleep apnea, SOB, knee replacement, per pt he has neuropathy in hands and feet    Limitations Standing;Walking;Lifting;House hold activities;Writing    Currently in Pain? Yes    Pain Location Shoulder    Pain Orientation Left    Pain Descriptors / Indicators Aching    Pain Type Chronic pain;Acute pain    Pain Onset 1 to 4 weeks ago    Pain Frequency Constant    Aggravating Factors  Using hand controls  INTERVENTIONS:   Therapeutic Exercises:   Resistive Strengthening: using Blue Theraband- ankle DF, PF, EV 2 sets of 12 reps. Increased Visual demo required for follow through. Patient with difficulty negotiating blue theraband. Added to his home program.    Neuromuscular re-education:   Patient position in  corner with chair placed in front of her and performed the following:     1) Feet apart- Eyes open 2) Feet apart- Eyes closed 3) Feet apart- EO and head turns/Nods 4) Feet apart - EC and head turns/Nods 5) Feet narrowed - EO 6) Feet narrowed- EC 7)Feet narrowed -  EO and head turns/nods 8) Feet narrowed - EC and Head turn/nods 9) Feet staggered - EO 6) Feet staggered - EC Education provided throughout session via VC/TC and demonstration to facilitate movement at target joints and correct muscle activation for all  exercises performed.    Clinical Impression: Patient challenged with balance activities - utilized corner and instructed patient to use at home. He demo good safety awareness with balance and stated the corner will give him more confidence to perform activities involving his eyes closed. step-length as well. The pt will benefit from further skilled PT to continue to improve LE strength, reactive postural contral, and balance to decrease fall risk                     PT Education - 08/14/21 0812     Education Details Exercise technique    Person(s) Educated Patient    Methods Explanation;Demonstration;Tactile cues;Verbal cues    Comprehension Verbalized understanding;Returned demonstration;Verbal cues required;Tactile cues required;Need further instruction              PT Short Term Goals - 07/30/21 0855       PT SHORT TERM GOAL #1   Title Pt will be independent with HEP in order to improve strength and balance in order to decrease fall risk and improve function at home and work.    Baseline 5/25: to be initiated next session; 6/27: pt indep with HEP    Time 6    Period Weeks    Status Achieved    Target Date 06/04/21               PT Long Term Goals - 07/30/21 0855       PT LONG TERM GOAL #1   Title Patient will increase Berg Balance score by > 6 points to demonstrate decreased fall risk during functional activities.    Baseline 5/25: 34/56; 6/27: 44/56    Time 12    Period Weeks    Status Achieved      PT LONG TERM GOAL #2   Title Pt will improve DGI by at least 3 points in order to demonstrate clinically significant improvement in balance and decreased risk for falls.    Baseline 5/25: 18/24;  6/27: 21/24    Time 12    Period Weeks    Status Achieved      PT LONG TERM GOAL #3   Title Pt will improve ABC by at least 13% in order to demonstrate clinically significant improvement in balance confidence.    Baseline 5/25: 66.87; 6/27: 70.63%; 8/3: 63.14%    Time 12    Period Weeks    Status On-going    Target Date 09/24/21      PT LONG TERM GOAL #4   Title Patient will increase 10 meter walk test to >1.33ms as to improve gait speed for better  community ambulation and to reduce fall risk.    Baseline 5/25: 0.86 m/s; 6/27: 0.97 m/s; 8/3: 1.09 m/s    Time 12    Period Weeks    Status Achieved      PT LONG TERM GOAL #5   Title Patient will increase FOTO score to equal to or greater than 69  to demonstrate statistically significant improvement in mobility and quality of life.    Baseline 5/25: 59; 6/27: 57; 8/3: 55 (pt reports questions not fully relevant to his problem)    Time 12    Period Weeks    Status On-going    Target Date 09/24/21      PT LONG TERM GOAL #6   Title Pt will demonstrate ability to maintain SLB for at least 5 seconds on each LE in order to improve ability to safely navigate up/down curbs and over obstacles.    Baseline 6/27: Pt unable to maintain SLB on either LE; 8/3: LLE 7 sec  RLE 2-3 sec    Time 12    Period Weeks    Status Partially Met    Target Date 09/24/21                   Plan - 08/14/21 0813     Clinical Impression Statement Patient challenged with balance activities - utilized corner and instructed patient to use at home. He demo good safety awareness with balance and stated the corner will give him more confidence to perform activities involving his eyes closed. step-length as well. The pt will benefit from further skilled PT to continue to improve LE strength, reactive postural contral, and balance to decrease fall risk    Personal Factors and Comorbidities Age;Fitness;Time since onset of injury/illness/exacerbation;Comorbidity  1;Comorbidity 2;Comorbidity 3+    Comorbidities pertinent per chart/pt: DMII, impaired sensation LEs, OA with hx of knee replacement, neuropathy, HTN    Examination-Activity Limitations Bathing;Carry;Lift;Stand;Locomotion Level;Transfers;Hygiene/Grooming;Reach Overhead;Stairs;Bend;Dressing;Squat    Examination-Participation Restrictions Community Activity;Shop;Volunteer;Cleaning;Yard Work;Church;Laundry;Meal Prep    Stability/Clinical Decision Making Stable/Uncomplicated    Rehab Potential Good    PT Frequency 2x / week    PT Duration 8 weeks    PT Treatment/Interventions ADLs/Self Care Home Management;Moist Heat;Cryotherapy;DME Instruction;Gait training;Stair training;Functional mobility training;Therapeutic activities;Therapeutic exercise;Balance training;Neuromuscular re-education;Patient/family education;Orthotic Fit/Training;Manual techniques;Passive range of motion;Energy conservation;Splinting;Dry needling;Taping;Visual/perceptual remediation/compensation;Biofeedback    PT Next Visit Plan Continues with strengthening and gait based balance and motor control training, half foam under heels balance/coordination exercise, balance tasks that rely on vesetibular cues; balance tasks involving active knee flexion to ext., progress resisted PF exercises in sitting; dynamic balance with quick turns, perturbation training with focus to left side, continue plan of care    PT Home Exercise Plan No updates today; issued thera putty (green) for improved grip strength to increase safety with challenging balance tasks; 8/3: Access Code: [email or web address]    Consulted and Agree with Plan of Care Patient             Patient will benefit from skilled therapeutic intervention in order to improve the following deficits and impairments:  Abnormal gait, Decreased activity tolerance, Improper body mechanics, Impaired sensation, Decreased balance, Decreased coordination, Decreased mobility, Difficulty walking, Impaired  vision/preception, Postural dysfunction, Decreased strength, Decreased range of motion, Decreased endurance, Hypomobility, Impaired flexibility, Pain  Visit Diagnosis: Abnormality of gait and mobility  Difficulty in walking, not elsewhere classified  Muscle weakness (generalized)  Other lack of coordination     Problem List Patient Active Problem List   Diagnosis Date  Noted   Systolic murmur 33/53/3174   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   Cellulitis 12/01/2018   Dysarthria 12/01/2018   HTN (hypertension) 12/01/2018   Other hyperlipidemia 12/01/2018   DNR (do not resuscitate) 03/07/2018   Urge incontinence of urine 07/23/2017   Thoracic sprain 07/23/2017   Stye 07/23/2017   SOB (shortness of breath) 07/23/2017   Sleep apnea 07/23/2017   Shoulder strain 07/23/2017   Nephrolithiasis 07/23/2017   Neoplasm of uncertain behavior of skin 07/23/2017   Neck sprain 07/23/2017   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 07/23/2017   Leg swelling 07/23/2017   Ingrowing nail with infection 07/23/2017   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   DM (diabetes mellitus) ([place]) 07/23/2017   Bladder cancer ([place]) 07/23/2017   Benign essential hypertension 07/23/2017   Backache 07/23/2017   Cellulitis of great toe of left foot 07/23/2017   Seizures ([place]) 07/13/2017   CKD (chronic kidney disease) stage 3, GFR 30-59 ml/min ([facility]) 07/06/2017   Type 2 diabetes mellitus with diabetic neuropathy, without long-term current use of insulin ([place]) 07/02/2017   Seizure disorder ([place]) 07/02/2017   Pure hypercholesterolemia 07/02/2017   Obesity (BMI 30.0-34.9) 07/02/2017   History of prostate cancer 07/02/2017   Coronary artery disease involving coronary bypass graft of native heart without angina pectoris 07/02/2017    Lewis Moccasin, PT 08/14/2021, 9:06 PM  [place] MAIN [facility] SERVICES [address] [place], [place],  [address] Phone: [phone]   Fax:  [phone]  Name: Elijah Oliver MRN: [id number] Date of Birth: [birth year]

## 2021-08-22 ENCOUNTER — Ambulatory Visit: Payer: Medicare Other

## 2021-08-22 ENCOUNTER — Other Ambulatory Visit: Payer: Self-pay

## 2021-08-22 DIAGNOSIS — R269 Unspecified abnormalities of gait and mobility: Secondary | ICD-10-CM

## 2021-08-22 DIAGNOSIS — R2681 Unsteadiness on feet: Secondary | ICD-10-CM | POA: Diagnosis not present

## 2021-08-22 DIAGNOSIS — R262 Difficulty in walking, not elsewhere classified: Secondary | ICD-10-CM

## 2021-08-22 DIAGNOSIS — M6281 Muscle weakness (generalized): Secondary | ICD-10-CM

## 2021-08-22 NOTE — Therapy (Signed)
[place] MAIN [facility] SERVICES [address] [place], [place], [address] Phone: [phone]   Fax:  [phone]  Physical Therapy Treatment  Patient Details  Name: Elijah Oliver MRN: [id number] Date of Birth: [birth year] Referring Provider (PT): Dion Body, MD   Encounter Date: 08/22/2021   PT End of Session - 08/22/21 0757     Visit Number 28    Number of Visits 38    Date for PT Re-Evaluation 09/24/21    Authorization Type Traditional Medicare A&B    Authorization Time Period Cert: 0/21/1155-01/07/222; Recert 3/61-22/44    Progress Note Due on Visit 30    PT Start Time 0800    PT Stop Time 0844    PT Time Calculation (min) 44 min    Equipment Utilized During Treatment Gait belt    Activity Tolerance Patient tolerated treatment well;No increased pain    Behavior During Therapy [facility] for tasks assessed/performed             Past Medical History:  Diagnosis Date   Backache 07/23/2017   Benign essential hypertension 07/23/2017   Bladder cancer ([place]) 07/23/2017   Cellulitis of great toe of left foot 07/23/2017   CKD (chronic kidney disease) stage 3, GFR 30-59 ml/min ([place]) 07/06/2017   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   Overview:  Sequential left internal mammary to the mid LAD and distal LAD, SVG to the second obtuse marginal and SVG to the right coronary artery. June 24th, 2006 Dr. 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 07/23/2017   Neck sprain 07/23/2017   Neoplasm of uncertain behavior of skin 07/23/2017   Nephrolithiasis 07/23/2017   Pure hypercholesterolemia 07/02/2017   Seizure disorder ([place]) 07/02/2017   Shoulder strain 07/23/2017   Sleep apnea 07/23/2017   SOB (shortness of breath) 07/23/2017   Thoracic sprain 07/23/2017   Type 2 diabetes mellitus with diabetic neuropathy, without long-term current use of insulin ([place]) 07/02/2017   Urge  incontinence of urine 07/23/2017    Past Surgical History:  Procedure Laterality Date   CARDIAC SURGERY  2006   PROSTATECTOMY  2004   REPLACEMENT TOTAL KNEE  2016   TRANSURETHRAL RESECTION OF BLADDER TUMOR WITH GYRUS (TURBT-GYRUS)  2010?   URETEROSCOPY WITH HOLMIUM LASER LITHOTRIPSY  2010?    There were no vitals filed for this visit.   Subjective Assessment - 08/22/21 0832     Subjective Went to the neurologist but wasn't able to see the doctor because they were out sick so say a different PA.  No falls or LOB since last session.    Pertinent History Pt is a pleasant 80 y/o male presenting to PT without an AD and with c/o impaired balance and gait disturbance that started over 4 years ago after his wife passed away from [place]. Per chart pt PMH includes: HTN, HLD, obesity, functional incontinence, pure hypercholesteroliemia, DMII, seizure disorder, CAD involving coronary bypass graft of native heart without angina pectoris (2006), hx of prostate CA, hx bladder CA, CKD, sleep apnea, SOB, knee replacement, per pt he has neuropathy in hands and feet    Limitations Standing;Walking;Lifting;House hold activities;Writing    Currently in Pain? Yes    Pain Score 3     Pain Location Shoulder    Pain Orientation Right    Pain Descriptors / Indicators Aching    Pain Type Chronic pain    Pain Onset 1 to 4  weeks ago    Pain Frequency Intermittent                Went to the neurologist but wasn't able to see the doctor because they were out sick so say a different PA.    Interventions   Neuro Re-ed:  Standing with CGA next to support surface:  Airex pad: static stand 30 seconds x 2 trials, noticeable trembling of ankles/LE's with fatigue and challenge to maintain stability Airex pad: eyes closed 3x30 seconds, max cueing requiring.  Airex pad: horizontal head turns 30 seconds scanning room 10x ; cueing for arc of motion  Airex pad: vertical head turns 30 seconds, cueing for arc of motion,  noticeable sway with upward gaze increasing demand on ankle righting reaction musculature Airex pad: one foot on 6" step one foot on airex pad, hold position for 30 seconds, switch legs, 2x each LE;  Bosu ball modified forward lunges 10x each LE Bosu ball modified lateral lunges 10x each LE   Half foam roller: df/pf 15x    TherEx Sit to stands with arms crossed 2x10.  PT demos technique throughout. Walking mini lunges 4x length of // bars    PT provides cuing throughout session primarily in the form of VC/TC/demo to facilitate movement at target joints and improved muscle activation. Pt with good technique following cuing     Patient is highly motivated throughout physical therapy session. Ankle righting reactions are improving on stable and unstable surfaces. He is limited with prolonged ankle righting reactions in static positioning however especially with eyes closed indicating area for continued focus. LE strengthening improving with patient reporting feeling stronger. The pt will benefit from further skilled PT to continue to improve LE strength, reactive postural contral, and balance to decrease fall risk                   PT Education - 08/22/21 0756     Education Details exercise technique, body mechanics    Person(s) Educated Patient    Methods Explanation;Demonstration;Tactile cues;Verbal cues    Comprehension Verbalized understanding;Returned demonstration;Verbal cues required;Tactile cues required              PT Short Term Goals - 07/30/21 0855       PT SHORT TERM GOAL #1   Title Pt will be independent with HEP in order to improve strength and balance in order to decrease fall risk and improve function at home and work.    Baseline 5/25: to be initiated next session; 6/27: pt indep with HEP    Time 6    Period Weeks    Status Achieved    Target Date 06/04/21               PT Long Term Goals - 07/30/21 0855       PT LONG TERM GOAL #1    Title Patient will increase Berg Balance score by > 6 points to demonstrate decreased fall risk during functional activities.    Baseline 5/25: 34/56; 6/27: 44/56    Time 12    Period Weeks    Status Achieved      PT LONG TERM GOAL #2   Title Pt will improve DGI by at least 3 points in order to demonstrate clinically significant improvement in balance and decreased risk for falls.    Baseline 5/25: 18/24; 6/27: 21/24    Time 12    Period Weeks    Status Achieved  PT LONG TERM GOAL #3   Title Pt will improve ABC by at least 13% in order to demonstrate clinically significant improvement in balance confidence.    Baseline 5/25: 66.87; 6/27: 70.63%; 8/3: 63.14%    Time 12    Period Weeks    Status On-going    Target Date 09/24/21      PT LONG TERM GOAL #4   Title Patient will increase 10 meter walk test to >1.36m/s as to improve gait speed for better community ambulation and to reduce fall risk.    Baseline 5/25: 0.86 m/s; 6/27: 0.97 m/s; 8/3: 1.09 m/s    Time 12    Period Weeks    Status Achieved      PT LONG TERM GOAL #5   Title Patient will increase FOTO score to equal to or greater than 69  to demonstrate statistically significant improvement in mobility and quality of life.    Baseline 5/25: 59; 6/27: 57; 8/3: 55 (pt reports questions not fully relevant to his problem)    Time 12    Period Weeks    Status On-going    Target Date 09/24/21      PT LONG TERM GOAL #6   Title Pt will demonstrate ability to maintain SLB for at least 5 seconds on each LE in order to improve ability to safely navigate up/down curbs and over obstacles.    Baseline 6/27: Pt unable to maintain SLB on either LE; 8/3: LLE 7 sec  RLE 2-3 sec    Time 12    Period Weeks    Status Partially Met    Target Date 09/24/21                   Plan - 08/22/21 6546     Clinical Impression Statement Patient is highly motivated throughout physical therapy session. Ankle righting reactions are  improving on stable and unstable surfaces. He is limited with prolonged ankle righting reactions in static positioning however especially with eyes closed indicating area for continued focus. LE strengthening improving with patient reporting feeling stronger. The pt will benefit from further skilled PT to continue to improve LE strength, reactive postural contral, and balance to decrease fall risk    Personal Factors and Comorbidities Age;Fitness;Time since onset of injury/illness/exacerbation;Comorbidity 1;Comorbidity 2;Comorbidity 3+    Comorbidities pertinent per chart/pt: DMII, impaired sensation LEs, OA with hx of knee replacement, neuropathy, HTN    Examination-Activity Limitations Bathing;Carry;Lift;Stand;Locomotion Level;Transfers;Hygiene/Grooming;Reach Overhead;Stairs;Bend;Dressing;Squat    Examination-Participation Restrictions Community Activity;Shop;Volunteer;Cleaning;Yard Work;Church;Laundry;Meal Prep    Stability/Clinical Decision Making Stable/Uncomplicated    Rehab Potential Good    PT Frequency 2x / week    PT Duration 8 weeks    PT Treatment/Interventions ADLs/Self Care Home Management;Moist Heat;Cryotherapy;DME Instruction;Gait training;Stair training;Functional mobility training;Therapeutic activities;Therapeutic exercise;Balance training;Neuromuscular re-education;Patient/family education;Orthotic Fit/Training;Manual techniques;Passive range of motion;Energy conservation;Splinting;Dry needling;Taping;Visual/perceptual remediation/compensation;Biofeedback    PT Next Visit Plan Continues with strengthening and gait based balance and motor control training, half foam under heels balance/coordination exercise, balance tasks that rely on vesetibular cues; balance tasks involving active knee flexion to ext., progress resisted PF exercises in sitting; dynamic balance with quick turns, perturbation training with focus to left side, continue plan of care    PT Home Exercise Plan No updates  today; issued thera putty (green) for improved grip strength to increase safety with challenging balance tasks; 8/3: Access Code: [email or web address]    Consulted and Agree with Plan of Care Patient  Patient will benefit from skilled therapeutic intervention in order to improve the following deficits and impairments:  Abnormal gait, Decreased activity tolerance, Improper body mechanics, Impaired sensation, Decreased balance, Decreased coordination, Decreased mobility, Difficulty walking, Impaired vision/preception, Postural dysfunction, Decreased strength, Decreased range of motion, Decreased endurance, Hypomobility, Impaired flexibility, Pain  Visit Diagnosis: Abnormality of gait and mobility  Difficulty in walking, not elsewhere classified  Muscle weakness (generalized)     Problem List Patient Active Problem List   Diagnosis Date Noted   Systolic murmur 74/93/5521   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   Cellulitis 12/01/2018   Dysarthria 12/01/2018   HTN (hypertension) 12/01/2018   Other hyperlipidemia 12/01/2018   DNR (do not resuscitate) 03/07/2018   Urge incontinence of urine 07/23/2017   Thoracic sprain 07/23/2017   Stye 07/23/2017   SOB (shortness of breath) 07/23/2017   Sleep apnea 07/23/2017   Shoulder strain 07/23/2017   Nephrolithiasis 07/23/2017   Neoplasm of uncertain behavior of skin 07/23/2017   Neck sprain 07/23/2017   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 07/23/2017   Leg swelling 07/23/2017   Ingrowing nail with infection 07/23/2017   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   DM (diabetes mellitus) ([place]) 07/23/2017   Bladder cancer ([place]) 07/23/2017   Benign essential hypertension 07/23/2017   Backache 07/23/2017   Cellulitis of great toe of left foot 07/23/2017   Seizures ([place]) 07/13/2017   CKD (chronic kidney disease) stage 3, GFR 30-59 ml/min ([facility]) 07/06/2017   Type 2 diabetes mellitus with diabetic neuropathy, without  long-term current use of insulin ([place]) 07/02/2017   Seizure disorder ([place]) 07/02/2017   Pure hypercholesterolemia 07/02/2017   Obesity (BMI 30.0-34.9) 07/02/2017   History of prostate cancer 07/02/2017   Coronary artery disease involving coronary bypass graft of native heart without angina pectoris 07/02/2017    Janna Arch, PT, DPT  08/22/2021, 8:53 AM  [place] MAIN [facility] SERVICES [address] [place], [place], [address] Phone: [phone]   Fax:  [phone]  Name: Elijah Oliver MRN: [id number] Date of Birth: [birth year]

## 2021-08-26 ENCOUNTER — Ambulatory Visit: Payer: Medicare Other

## 2021-08-26 ENCOUNTER — Other Ambulatory Visit: Payer: Self-pay

## 2021-08-26 DIAGNOSIS — R2681 Unsteadiness on feet: Secondary | ICD-10-CM

## 2021-08-26 DIAGNOSIS — M6281 Muscle weakness (generalized): Secondary | ICD-10-CM

## 2021-08-26 DIAGNOSIS — R269 Unspecified abnormalities of gait and mobility: Secondary | ICD-10-CM

## 2021-08-26 DIAGNOSIS — R262 Difficulty in walking, not elsewhere classified: Secondary | ICD-10-CM

## 2021-08-26 NOTE — Therapy (Signed)
[place] MAIN [facility] SERVICES [address] [place], [place], [address] Phone: [phone]   Fax:  [phone]  Physical Therapy Treatment  Patient Details  Name: Elijah Oliver MRN: [id number] Date of Birth: 1941-11-19 Referring Provider (PT): Dion Body, MD   Encounter Date: 08/26/2021   PT End of Session - 08/26/21 0901     Visit Number 29    Number of Visits 38    Date for PT Re-Evaluation 09/24/21    Authorization Type Traditional Medicare A&B    Authorization Time Period Cert: 3/90/3009-2/33/0076; Recert 2/26-33/35    Progress Note Due on Visit 30    PT Start Time 0846    PT Stop Time 0929    PT Time Calculation (min) 43 min    Equipment Utilized During Treatment Gait belt    Activity Tolerance Patient tolerated treatment well;No increased pain    Behavior During Therapy [facility] for tasks assessed/performed             Past Medical History:  Diagnosis Date   Backache 07/23/2017   Benign essential hypertension 07/23/2017   Bladder cancer ([place]) 07/23/2017   Cellulitis of great toe of left foot 07/23/2017   CKD (chronic kidney disease) stage 3, GFR 30-59 ml/min ([place]) 07/06/2017   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   Overview:  Sequential left internal mammary to the mid LAD and distal LAD, SVG to the second obtuse marginal and SVG to the right coronary artery. June 24th, 2006 Dr. 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 07/23/2017   Neck sprain 07/23/2017   Neoplasm of uncertain behavior of skin 07/23/2017   Nephrolithiasis 07/23/2017   Pure hypercholesterolemia 07/02/2017   Seizure disorder ([place]) 07/02/2017   Shoulder strain 07/23/2017   Sleep apnea 07/23/2017   SOB (shortness of breath) 07/23/2017   Thoracic sprain 07/23/2017   Type 2 diabetes mellitus with diabetic neuropathy, without long-term current use of insulin ([place]) 07/02/2017   Urge  incontinence of urine 07/23/2017    Past Surgical History:  Procedure Laterality Date   CARDIAC SURGERY  2006   PROSTATECTOMY  2004   REPLACEMENT TOTAL KNEE  2016   TRANSURETHRAL RESECTION OF BLADDER TUMOR WITH GYRUS (TURBT-GYRUS)  2010?   URETEROSCOPY WITH HOLMIUM LASER LITHOTRIPSY  2010?    There were no vitals filed for this visit.   Subjective Assessment - 08/26/21 0850     Subjective Patient reports no changes since last visit including no falls.    Pertinent History Pt is a pleasant 80 y/o male presenting to PT without an AD and with c/o impaired balance and gait disturbance that started over 4 years ago after his wife passed away from [place]. Per chart pt PMH includes: HTN, HLD, obesity, functional incontinence, pure hypercholesteroliemia, DMII, seizure disorder, CAD involving coronary bypass graft of native heart without angina pectoris (2006), hx of prostate CA, hx bladder CA, CKD, sleep apnea, SOB, knee replacement, per pt he has neuropathy in hands and feet    Limitations Standing;Walking;Lifting;House hold activities;Writing    Currently in Pain? Yes    Pain Score 3     Pain Location Shoulder    Pain Orientation Right    Pain Descriptors / Indicators Aching    Pain Type Chronic pain    Pain Onset 1 to 4 weeks ago    Pain Frequency Intermittent    Aggravating Factors  worse in the am when  I get up              INTERVENTIONS:   Neuromuscular re-education:   Forward/backward step up/over 1/2 foam roll x 20 reps (more difficulty with backward steps)   Side step up/over 1/2 foam roll x 20 reps (difficulty with coordination) requiring CGA  Standing with CGA next to support surface:  Airex pad: static stand 30 seconds x 2 trials, increased ankle righting reaction ankles with more shakiness with  fatigue and challenge to maintain stability Airex pad: eyes closed 3x30 seconds, max cueing requiring.  Airex pad: horizontal head turns 30 seconds scanning room 10x ; cueing  for arc of motion  Airex pad: vertical head turns 30 seconds, cueing for arc of motion Airex pad: one foot on 6" step one foot on airex pad, hold position for 30 seconds, switch legs, 2x each LE Standing on airex pad- then step tap onto 6" box without UE support x 12 reps each leg- Unsteady with intermittent UE support.  Static stand in staggered position on balance beam - Hold for 10-20 sec (patient performed 5 trials on each leg and performed better with right foot in front)    Attempted calf raises - Patient unable to perform using 1/2 foam roll or firm surface (with increased UE Support and demo flexing knees). He was able to perform 20 reps in seated position.    Clinical Impression: Patient presents with excellent motivation for today's session and responsive to all VC/Visual demo to perform balance activities well. He was challenged with standing on non-compliant surfaces today and continues to demo increased difficulty with coordination due to lack of sensation. He did improve with practice during each activity except unable to perform standing calf raise today and encouraged to continue to practice at home. The pt will benefit from further skilled PT to continue to improve LE strength, reactive postural contral, and balance to decrease fall risk                     PT Education - 08/26/21 0854     Education Details Exercise technique with balance activities for safety    Person(s) Educated Patient    Methods Explanation;Demonstration;Tactile cues;Verbal cues    Comprehension Verbalized understanding;Returned demonstration;Verbal cues required;Need further instruction              PT Short Term Goals - 07/30/21 0855       PT SHORT TERM GOAL #1   Title Pt will be independent with HEP in order to improve strength and balance in order to decrease fall risk and improve function at home and work.    Baseline 5/25: to be initiated next session; 6/27: pt indep with  HEP    Time 6    Period Weeks    Status Achieved    Target Date 06/04/21               PT Long Term Goals - 07/30/21 0855       PT LONG TERM GOAL #1   Title Patient will increase Berg Balance score by > 6 points to demonstrate decreased fall risk during functional activities.    Baseline 5/25: 34/56; 6/27: 44/56    Time 12    Period Weeks    Status Achieved      PT LONG TERM GOAL #2   Title Pt will improve DGI by at least 3 points in order to demonstrate clinically significant improvement in balance and decreased risk for  falls.    Baseline 5/25: 18/24; 6/27: 21/24    Time 12    Period Weeks    Status Achieved      PT LONG TERM GOAL #3   Title Pt will improve ABC by at least 13% in order to demonstrate clinically significant improvement in balance confidence.    Baseline 5/25: 66.87; 6/27: 70.63%; 8/3: 63.14%    Time 12    Period Weeks    Status On-going    Target Date 09/24/21      PT LONG TERM GOAL #4   Title Patient will increase 10 meter walk test to >1.43ms as to improve gait speed for better community ambulation and to reduce fall risk.    Baseline 5/25: 0.86 m/s; 6/27: 0.97 m/s; 8/3: 1.09 m/s    Time 12    Period Weeks    Status Achieved      PT LONG TERM GOAL #5   Title Patient will increase FOTO score to equal to or greater than 69  to demonstrate statistically significant improvement in mobility and quality of life.    Baseline 5/25: 59; 6/27: 57; 8/3: 55 (pt reports questions not fully relevant to his problem)    Time 12    Period Weeks    Status On-going    Target Date 09/24/21      PT LONG TERM GOAL #6   Title Pt will demonstrate ability to maintain SLB for at least 5 seconds on each LE in order to improve ability to safely navigate up/down curbs and over obstacles.    Baseline 6/27: Pt unable to maintain SLB on either LE; 8/3: LLE 7 sec  RLE 2-3 sec    Time 12    Period Weeks    Status Partially Met    Target Date 09/24/21                    Plan - 08/26/21 0901     Clinical Impression Statement Patient presents with excellent motivation for today's session and responsive to all VC/Visual demo to perform balance activities well. He was challenged with standing on non-compliant surfaces today and continues to demo increased difficulty with coordination due to lack of sensation. He did improve with practice during each activity except unable to perform standing calf raise today and encouraged to continue to practice at home. The pt will benefit from further skilled PT to continue to improve LE strength, reactive postural contral, and balance to decrease fall risk.    Personal Factors and Comorbidities Age;Fitness;Time since onset of injury/illness/exacerbation;Comorbidity 1;Comorbidity 2;Comorbidity 3+    Comorbidities pertinent per chart/pt: DMII, impaired sensation LEs, OA with hx of knee replacement, neuropathy, HTN    Examination-Activity Limitations Bathing;Carry;Lift;Stand;Locomotion Level;Transfers;Hygiene/Grooming;Reach Overhead;Stairs;Bend;Dressing;Squat    Examination-Participation Restrictions Community Activity;Shop;Volunteer;Cleaning;Yard Work;Church;Laundry;Meal Prep    Stability/Clinical Decision Making Stable/Uncomplicated    Rehab Potential Good    PT Frequency 2x / week    PT Duration 8 weeks    PT Treatment/Interventions ADLs/Self Care Home Management;Moist Heat;Cryotherapy;DME Instruction;Gait training;Stair training;Functional mobility training;Therapeutic activities;Therapeutic exercise;Balance training;Neuromuscular re-education;Patient/family education;Orthotic Fit/Training;Manual techniques;Passive range of motion;Energy conservation;Splinting;Dry needling;Taping;Visual/perceptual remediation/compensation;Biofeedback    PT Next Visit Plan Continues with strengthening and gait based balance and motor control training, half foam under heels balance/coordination exercise, balance tasks that rely on  vesetibular cues; balance tasks involving active knee flexion to ext., progress resisted PF exercises in sitting; dynamic balance with quick turns, perturbation training with focus to left side, continue plan of care  PT Home Exercise Plan No updates today; issued thera putty (green) for improved grip strength to increase safety with challenging balance tasks; 8/3: Access Code: [email or web address]    Consulted and Agree with Plan of Care Patient             Patient will benefit from skilled therapeutic intervention in order to improve the following deficits and impairments:  Abnormal gait, Decreased activity tolerance, Improper body mechanics, Impaired sensation, Decreased balance, Decreased coordination, Decreased mobility, Difficulty walking, Impaired vision/preception, Postural dysfunction, Decreased strength, Decreased range of motion, Decreased endurance, Hypomobility, Impaired flexibility, Pain  Visit Diagnosis: Abnormality of gait and mobility  Difficulty in walking, not elsewhere classified  Muscle weakness (generalized)  Unsteadiness on feet     Problem List Patient Active Problem List   Diagnosis Date Noted   Systolic murmur 35/57/3220   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   Cellulitis 12/01/2018   Dysarthria 12/01/2018   HTN (hypertension) 12/01/2018   Other hyperlipidemia 12/01/2018   DNR (do not resuscitate) 03/07/2018   Urge incontinence of urine 07/23/2017   Thoracic sprain 07/23/2017   Stye 07/23/2017   SOB (shortness of breath) 07/23/2017   Sleep apnea 07/23/2017   Shoulder strain 07/23/2017   Nephrolithiasis 07/23/2017   Neoplasm of uncertain behavior of skin 07/23/2017   Neck sprain 07/23/2017   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 07/23/2017   Leg swelling 07/23/2017   Ingrowing nail with infection 07/23/2017   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   DM (diabetes mellitus) ([place]) 07/23/2017   Bladder cancer ([place]) 07/23/2017   Benign  essential hypertension 07/23/2017   Backache 07/23/2017   Cellulitis of great toe of left foot 07/23/2017   Seizures ([place]) 07/13/2017   CKD (chronic kidney disease) stage 3, GFR 30-59 ml/min ([facility]) 07/06/2017   Type 2 diabetes mellitus with diabetic neuropathy, without long-term current use of insulin ([place]) 07/02/2017   Seizure disorder ([place]) 07/02/2017   Pure hypercholesterolemia 07/02/2017   Obesity (BMI 30.0-34.9) 07/02/2017   History of prostate cancer 07/02/2017   Coronary artery disease involving coronary bypass graft of native heart without angina pectoris 07/02/2017    Lewis Moccasin, PT 08/27/2021, 6:23 AM  [place] MAIN [facility] SERVICES [address] [place], [place], [address] Phone: [phone]   Fax:  [phone]  Name: SABURO LUGER MRN: [id number] Date of Birth: [birth year]

## 2021-08-28 ENCOUNTER — Ambulatory Visit: Payer: Medicare Other | Admitting: Physical Therapy

## 2021-08-28 ENCOUNTER — Encounter: Payer: Self-pay | Admitting: Podiatry

## 2021-08-28 ENCOUNTER — Other Ambulatory Visit: Payer: Self-pay

## 2021-08-28 DIAGNOSIS — R2681 Unsteadiness on feet: Secondary | ICD-10-CM

## 2021-08-28 DIAGNOSIS — M6281 Muscle weakness (generalized): Secondary | ICD-10-CM

## 2021-08-28 DIAGNOSIS — R262 Difficulty in walking, not elsewhere classified: Secondary | ICD-10-CM

## 2021-08-28 DIAGNOSIS — R2689 Other abnormalities of gait and mobility: Secondary | ICD-10-CM

## 2021-08-28 DIAGNOSIS — R269 Unspecified abnormalities of gait and mobility: Secondary | ICD-10-CM

## 2021-08-28 DIAGNOSIS — R278 Other lack of coordination: Secondary | ICD-10-CM

## 2021-08-28 NOTE — Therapy (Signed)
[place] MAIN [facility] SERVICES [address] [place], [place], [address] Phone: [phone]   Fax:  [phone]  Physical Therapy Treatment/ Physical Therapy Progress Note  Dates of reporting period 07/02/2021 to 08/28/2021  Visit 30   Patient Details  Name: Elijah Oliver MRN: [id number] Date of Birth: 06-26-1941 Referring Provider (PT): Dion Body, MD   Encounter Date: 08/28/2021   PT End of Session - 08/28/21 1054     Visit Number 30    Number of Visits 62    Date for PT Re-Evaluation 09/24/21    Authorization Type Traditional Medicare A&B    Authorization Time Period Cert: 03/08/7352-2/99/2426; Recert 8/34-19/62    Progress Note Due on Visit 30    PT Start Time 0855    PT Stop Time 0930    PT Time Calculation (min) 35 min    Equipment Utilized During Treatment Gait belt    Activity Tolerance Patient tolerated treatment well;No increased pain    Behavior During Therapy [facility] for tasks assessed/performed             Past Medical History:  Diagnosis Date   Backache 07/23/2017   Benign essential hypertension 07/23/2017   Bladder cancer ([place]) 07/23/2017   Cellulitis of great toe of left foot 07/23/2017   CKD (chronic kidney disease) stage 3, GFR 30-59 ml/min ([place]) 07/06/2017   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   Overview:  Sequential left internal mammary to the mid LAD and distal LAD, SVG to the second obtuse marginal and SVG to the right coronary artery. June 24th, 2006 Dr. 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 07/23/2017   Neck sprain 07/23/2017   Neoplasm of uncertain behavior of skin 07/23/2017   Nephrolithiasis 07/23/2017   Pure hypercholesterolemia 07/02/2017   Seizure disorder ([place]) 07/02/2017   Shoulder strain 07/23/2017   Sleep apnea 07/23/2017   SOB (shortness of breath) 07/23/2017   Thoracic sprain 07/23/2017   Type 2 diabetes mellitus  with diabetic neuropathy, without long-term current use of insulin ([place]) 07/02/2017   Urge incontinence of urine 07/23/2017    Past Surgical History:  Procedure Laterality Date   CARDIAC SURGERY  2006   PROSTATECTOMY  2004   REPLACEMENT TOTAL KNEE  2016   TRANSURETHRAL RESECTION OF BLADDER TUMOR WITH GYRUS (TURBT-GYRUS)  2010?   URETEROSCOPY WITH HOLMIUM LASER LITHOTRIPSY  2010?    There were no vitals filed for this visit.   Subjective Assessment - 08/28/21 0859     Subjective Patient reports no changes since last visit including no falls. He reports mild pain in B knees when standing; otherwise no pain. No questions regarding HEP.    Pertinent History Pt is a pleasant 80 y/o male presenting to PT without an AD and with c/o impaired balance and gait disturbance that started over 4 years ago after his wife passed away from [place]. Per chart pt PMH includes: HTN, HLD, obesity, functional incontinence, pure hypercholesteroliemia, DMII, seizure disorder, CAD involving coronary bypass graft of native heart without angina pectoris (2006), hx of prostate CA, hx bladder CA, CKD, sleep apnea, SOB, knee replacement, per pt he has neuropathy in hands and feet    Limitations Standing;Walking;Lifting;House hold activities;Writing    Currently in Pain? No/denies    Pain Onset 1 to 4 weeks ago              INTERVENTIONS:   FOTO: 57 SLB:  R-  2.3 seconds , L- 2.7 seconds   Neuromuscular re-education:    Forward/backward step up/over 1/2 foam roll x 20 reps (more difficulty with backward steps)    Side step up/over 1/2 foam roll x 20 reps (difficulty with coordination) requiring CGA   Standing with CGA next to support surface:  Airex pad: static stand 30 seconds x 2 trials, increased ankle righting reaction ankles with more shakiness with  fatigue and challenge to maintain stability Airex pad: eyes closed 3x30 seconds, max cueing requiring.  Airex pad: horizontal head turns 30 seconds scanning  room 10x ; cueing for arc of motion  Airex pad: vertical head turns 30 seconds, cueing for arc of motion       Clinical Impression: Session limited due to patient arriving late. Goals/outcomes reassessed this session. Pt demo increased challenge compared to previous sessions with SLS bilaterally; FOTO remains at a 57. Patient is pleasant and continues to present with excellent motivation throughout PT session. He was challenged with standing on non-compliant surfaces, especially with visual feedback removed, and continues to demo increased difficulty with coordination due to lack of sensation. Backwards stepping with LLE trailing initial right step over half-foam roll proved to be the most challenging exercise of the session - PT providing visual and verbal cueing to improve clearance of LLE. The pt will benefit from further skilled PT to continue to improve LE strength, reactive postural contral, and balance to decrease fall risk.     Patient's condition has the potential to improve in response to therapy. Maximum improvement is yet to be obtained. The anticipated improvement is attainable and reasonable in a generally predictable time.          PT Short Term Goals - 07/30/21 0855       PT SHORT TERM GOAL #1   Title Pt will be independent with HEP in order to improve strength and balance in order to decrease fall risk and improve function at home and work.    Baseline 5/25: to be initiated next session; 6/27: pt indep with HEP    Time 6    Period Weeks    Status Achieved    Target Date 06/04/21               PT Long Term Goals - 07/30/21 0855       PT LONG TERM GOAL #1   Title Patient will increase Berg Balance score by > 6 points to demonstrate decreased fall risk during functional activities.    Baseline 5/25: 34/56; 6/27: 44/56    Time 12    Period Weeks    Status Achieved      PT LONG TERM GOAL #2   Title Pt will improve DGI by at least 3 points in order to  demonstrate clinically significant improvement in balance and decreased risk for falls.    Baseline 5/25: 18/24; 6/27: 21/24    Time 12    Period Weeks    Status Achieved      PT LONG TERM GOAL #3   Title Pt will improve ABC by at least 13% in order to demonstrate clinically significant improvement in balance confidence.    Baseline 5/25: 66.87; 6/27: 70.63%; 8/3: 63.14%    Time 12    Period Weeks    Status On-going    Target Date 09/24/21      PT LONG TERM GOAL #4   Title Patient will increase 10 meter walk test to >1.52ms as to improve  gait speed for better community ambulation and to reduce fall risk.    Baseline 5/25: 0.86 m/s; 6/27: 0.97 m/s; 8/3: 1.09 m/s    Time 12    Period Weeks    Status Achieved      PT LONG TERM GOAL #5   Title Patient will increase FOTO score to equal to or greater than 69  to demonstrate statistically significant improvement in mobility and quality of life.    Baseline 5/25: 59; 6/27: 57; 8/3: 55 (pt reports questions not fully relevant to his problem)    Time 12    Period Weeks    Status On-going    Target Date 09/24/21      PT LONG TERM GOAL #6   Title Pt will demonstrate ability to maintain SLB for at least 5 seconds on each LE in order to improve ability to safely navigate up/down curbs and over obstacles.    Baseline 6/27: Pt unable to maintain SLB on either LE; 8/3: LLE 7 sec  RLE 2-3 sec    Time 12    Period Weeks    Status Partially Met    Target Date 09/24/21                   Plan - 08/28/21 1054     Clinical Impression Statement Session limited due to patient arriving late. Goals/outcomes reassessed this session. Pt demo increased challenge compared to previous sessions with SLS bilaterally; FOTO remains at a 57. Patient is pleasant and continues to present with excellent motivation throughout PT session. He was challenged with standing on non-compliant surfaces, especially with visual feedback removed, and continues to demo  increased difficulty with coordination due to lack of sensation. Backwards stepping with LLE trailing initial right step over half-foam roll proved to be the most challenging exercise of the session - PT providing visual and verbal cueing to improve clearance of LLE. The pt will benefit from further skilled PT to continue to improve LE strength, reactive postural contral, and balance to decrease fall risk. Patient's condition has the potential to improve in response to therapy. Maximum improvement is yet to be obtained. The anticipated improvement is attainable and reasonable in a generally predictable time.    Personal Factors and Comorbidities Age;Fitness;Time since onset of injury/illness/exacerbation;Comorbidity 1;Comorbidity 2;Comorbidity 3+    Comorbidities pertinent per chart/pt: DMII, impaired sensation LEs, OA with hx of knee replacement, neuropathy, HTN    Examination-Activity Limitations Bathing;Carry;Lift;Stand;Locomotion Level;Transfers;Hygiene/Grooming;Reach Overhead;Stairs;Bend;Dressing;Squat    Examination-Participation Restrictions Community Activity;Shop;Volunteer;Cleaning;Yard Work;Church;Laundry;Meal Prep    Stability/Clinical Decision Making Stable/Uncomplicated    Rehab Potential Good    PT Frequency 2x / week    PT Duration 8 weeks    PT Treatment/Interventions ADLs/Self Care Home Management;Moist Heat;Cryotherapy;DME Instruction;Gait training;Stair training;Functional mobility training;Therapeutic activities;Therapeutic exercise;Balance training;Neuromuscular re-education;Patient/family education;Orthotic Fit/Training;Manual techniques;Passive range of motion;Energy conservation;Splinting;Dry needling;Taping;Visual/perceptual remediation/compensation;Biofeedback    PT Next Visit Plan Continues with strengthening and gait based balance and motor control training, half foam under heels balance/coordination exercise, balance tasks that rely on vesetibular cues; balance tasks involving  active knee flexion to ext., progress resisted PF exercises in sitting; dynamic balance with quick turns, perturbation training with focus to left side, continue plan of care    PT Home Exercise Plan No updates today; issued thera putty (green) for improved grip strength to increase safety with challenging balance tasks; 8/3: Access Code: [email or web address]    Consulted and Agree with Plan of Care Patient  Patient will benefit from skilled therapeutic intervention in order to improve the following deficits and impairments:  Abnormal gait, Decreased activity tolerance, Improper body mechanics, Impaired sensation, Decreased balance, Decreased coordination, Decreased mobility, Difficulty walking, Impaired vision/preception, Postural dysfunction, Decreased strength, Decreased range of motion, Decreased endurance, Hypomobility, Impaired flexibility, Pain  Visit Diagnosis: Abnormality of gait and mobility  Difficulty in walking, not elsewhere classified  Muscle weakness (generalized)  Unsteadiness on feet  Other lack of coordination  Other abnormalities of gait and mobility     Problem List Patient Active Problem List   Diagnosis Date Noted   Systolic murmur 56/94/3700   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   Cellulitis 12/01/2018   Dysarthria 12/01/2018   HTN (hypertension) 12/01/2018   Other hyperlipidemia 12/01/2018   DNR (do not resuscitate) 03/07/2018   Urge incontinence of urine 07/23/2017   Thoracic sprain 07/23/2017   Stye 07/23/2017   SOB (shortness of breath) 07/23/2017   Sleep apnea 07/23/2017   Shoulder strain 07/23/2017   Nephrolithiasis 07/23/2017   Neoplasm of uncertain behavior of skin 07/23/2017   Neck sprain 07/23/2017   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 07/23/2017   Leg swelling 07/23/2017   Ingrowing nail with infection 07/23/2017   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   DM (diabetes mellitus) ([place]) 07/23/2017   Bladder  cancer ([place]) 07/23/2017   Benign essential hypertension 07/23/2017   Backache 07/23/2017   Cellulitis of great toe of left foot 07/23/2017   Seizures ([place]) 07/13/2017   CKD (chronic kidney disease) stage 3, GFR 30-59 ml/min (HCC) 07/06/2017   Type 2 diabetes mellitus with diabetic neuropathy, without long-term current use of insulin ([place]) 07/02/2017   Seizure disorder ([place]) 07/02/2017   Pure hypercholesterolemia 07/02/2017   Obesity (BMI 30.0-34.9) 07/02/2017   History of prostate cancer 07/02/2017   Coronary artery disease involving coronary bypass graft of native heart without angina pectoris 07/02/2017     Patrina Levering PT, DPT  Ramonita Lab, PT 08/28/2021, 11:03 AM  [place] [address] [place], [place], [address] Phone: [phone]   Fax:  [phone]  Name: Elijah Oliver MRN: [id number] Date of Birth: 02-21-1941

## 2021-09-02 ENCOUNTER — Other Ambulatory Visit: Payer: Self-pay

## 2021-09-02 ENCOUNTER — Ambulatory Visit: Payer: Medicare Other | Attending: Family Medicine

## 2021-09-02 DIAGNOSIS — R278 Other lack of coordination: Secondary | ICD-10-CM | POA: Insufficient documentation

## 2021-09-02 DIAGNOSIS — R269 Unspecified abnormalities of gait and mobility: Secondary | ICD-10-CM | POA: Diagnosis present

## 2021-09-02 DIAGNOSIS — R262 Difficulty in walking, not elsewhere classified: Secondary | ICD-10-CM | POA: Diagnosis present

## 2021-09-02 DIAGNOSIS — R2681 Unsteadiness on feet: Secondary | ICD-10-CM | POA: Diagnosis not present

## 2021-09-02 DIAGNOSIS — M6281 Muscle weakness (generalized): Secondary | ICD-10-CM | POA: Insufficient documentation

## 2021-09-02 DIAGNOSIS — R2689 Other abnormalities of gait and mobility: Secondary | ICD-10-CM | POA: Insufficient documentation

## 2021-09-02 NOTE — Therapy (Signed)
[place] MAIN [facility] SERVICES [address] [place], [place], [address] Phone: [phone]   Fax:  [phone]  Physical Therapy Treatment  Patient Details  Name: Elijah Oliver MRN: [id number] Date of Birth: Mar 06, 1941 Referring Provider (PT): Dion Body, MD   Encounter Date: 09/02/2021    Past Medical History:  Diagnosis Date   Backache 07/23/2017   Benign essential hypertension 07/23/2017   Bladder cancer ([place]) 07/23/2017   Cellulitis of great toe of left foot 07/23/2017   CKD (chronic kidney disease) stage 3, GFR 30-59 ml/min ([place]) 07/06/2017   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   Overview:  Sequential left internal mammary to the mid LAD and distal LAD, SVG to the second obtuse marginal and SVG to the right coronary artery. June 24th, 2006 Dr. 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 07/23/2017   Neck sprain 07/23/2017   Neoplasm of uncertain behavior of skin 07/23/2017   Nephrolithiasis 07/23/2017   Pure hypercholesterolemia 07/02/2017   Seizure disorder ([place]) 07/02/2017   Shoulder strain 07/23/2017   Sleep apnea 07/23/2017   SOB (shortness of breath) 07/23/2017   Thoracic sprain 07/23/2017   Type 2 diabetes mellitus with diabetic neuropathy, without long-term current use of insulin ([place]) 07/02/2017   Urge incontinence of urine 07/23/2017    Past Surgical History:  Procedure Laterality Date   CARDIAC SURGERY  2006   PROSTATECTOMY  2004   REPLACEMENT TOTAL KNEE  2016   TRANSURETHRAL RESECTION OF BLADDER TUMOR WITH GYRUS (TURBT-GYRUS)  2010?   URETEROSCOPY WITH HOLMIUM LASER LITHOTRIPSY  2010?    There were no vitals filed for this visit.   Interventions  Initial LE warm-up and strengthening. Pt performs the following ankle strengthening exercises: GTB inversion/eversion 20x each for each LE  Seated DF/PF 20-25 reps each LE.  Pt then performs the  following balance interventions   In // bars:  Pt standing on half-foam in DF to promote ankle mobility and ankle strategies for several minutes. Pt with a few instances of finger-touch support on bar. --Progresses to performing with dual cognitive task (describe your former neighborhood, work, Social research officer, government) x several minutes ---pt then progresses to use of vertical, horizontal head turns while still preforming cog dual task Comments: use of intermittent UE support throughout   Pt then changes position to PF on half foam (heels on foam, toes touching floor) first without dual task x several minutes.  ---Pt then progresses to with dual cog task. ---Pt then progresses to use of vertical, horizontal head turns while continuing cog dual task. Comments: use of intermittent UE support throughout.   Pt standing on airex, NBOS, EC - 2 minutes, use of intermittent UE support. Pt steadies with time.  --progression to performing with cognitive dual task. Continued use of intermittent UE support.   SLB progression: one foot on floor and one on 6" step with airex on it. Pt performs 60 sec each LE. --progresses to performing with cog. Dual task for 60 sec each LE. Intermittent UE support used throughout.  Reactive postural control training ("trust fall" intervention), focus on step-strategy to L side. Pt leans into PT's hands to L side, PT briefly releases and then provides support again to encourage pt to use step-strategy to L to regain balance. Pt performs several reps. Pt exhibits increased size of step reaction compared to prior sessions.      PT provides cuing throughout session primarily  in the form of VC/TC/demo to facilitate movement at target joints and improved muscle activation. Pt with good technique following cuing                                PT Short Term Goals - 07/30/21 0855       PT SHORT TERM GOAL #1   Title Pt will be independent with HEP in order to improve  strength and balance in order to decrease fall risk and improve function at home and work.    Baseline 5/25: to be initiated next session; 6/27: pt indep with HEP    Time 6    Period Weeks    Status Achieved    Target Date 06/04/21               PT Long Term Goals - 07/30/21 0855       PT LONG TERM GOAL #1   Title Patient will increase Berg Balance score by > 6 points to demonstrate decreased fall risk during functional activities.    Baseline 5/25: 34/56; 6/27: 44/56    Time 12    Period Weeks    Status Achieved      PT LONG TERM GOAL #2   Title Pt will improve DGI by at least 3 points in order to demonstrate clinically significant improvement in balance and decreased risk for falls.    Baseline 5/25: 18/24; 6/27: 21/24    Time 12    Period Weeks    Status Achieved      PT LONG TERM GOAL #3   Title Pt will improve ABC by at least 13% in order to demonstrate clinically significant improvement in balance confidence.    Baseline 5/25: 66.87; 6/27: 70.63%; 8/3: 63.14%    Time 12    Period Weeks    Status On-going    Target Date 09/24/21      PT LONG TERM GOAL #4   Title Patient will increase 10 meter walk test to >1.97ms as to improve gait speed for better community ambulation and to reduce fall risk.    Baseline 5/25: 0.86 m/s; 6/27: 0.97 m/s; 8/3: 1.09 m/s    Time 12    Period Weeks    Status Achieved      PT LONG TERM GOAL #5   Title Patient will increase FOTO score to equal to or greater than 69  to demonstrate statistically significant improvement in mobility and quality of life.    Baseline 5/25: 59; 6/27: 57; 8/3: 55 (pt reports questions not fully relevant to his problem)    Time 12    Period Weeks    Status On-going    Target Date 09/24/21      PT LONG TERM GOAL #6   Title Pt will demonstrate ability to maintain SLB for at least 5 seconds on each LE in order to improve ability to safely navigate up/down curbs and over obstacles.    Baseline 6/27: Pt  unable to maintain SLB on either LE; 8/3: LLE 7 sec  RLE 2-3 sec    Time 12    Period Weeks    Status Partially Met    Target Date 09/24/21                    Patient will benefit from skilled therapeutic intervention in order to improve the following deficits and impairments:     Visit Diagnosis:  No diagnosis found.     Problem List Patient Active Problem List   Diagnosis Date Noted   Systolic murmur 09/47/0962   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   Adenocarcinoma of prostate ([place]) 12/01/2018   Cellulitis 12/01/2018   Dysarthria 12/01/2018   HTN (hypertension) 12/01/2018   Other hyperlipidemia 12/01/2018   DNR (do not resuscitate) 03/07/2018   Urge incontinence of urine 07/23/2017   Thoracic sprain 07/23/2017   Stye 07/23/2017   SOB (shortness of breath) 07/23/2017   Sleep apnea 07/23/2017   Shoulder strain 07/23/2017   Nephrolithiasis 07/23/2017   Neoplasm of uncertain behavior of skin 07/23/2017   Neck sprain 07/23/2017   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 07/23/2017   Leg swelling 07/23/2017   Ingrowing nail with infection 07/23/2017   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   DM (diabetes mellitus) ([place]) 07/23/2017   Bladder cancer ([place]) 07/23/2017   Benign essential hypertension 07/23/2017   Backache 07/23/2017   Cellulitis of great toe of left foot 07/23/2017   Seizures ([place]) 07/13/2017   CKD (chronic kidney disease) stage 3, GFR 30-59 ml/min (HCC) 07/06/2017   Type 2 diabetes mellitus with diabetic neuropathy, without long-term current use of insulin ([place]) 07/02/2017   Seizure disorder ([place]) 07/02/2017   Pure hypercholesterolemia 07/02/2017   Obesity (BMI 30.0-34.9) 07/02/2017   History of prostate cancer 07/02/2017   Coronary artery disease involving coronary bypass graft of native heart without angina pectoris 07/02/2017    Zollie Pee, PT 09/02/2021, 7:59 AM  [place] MAIN [facility] SERVICES [address] [place],  [place], [address] Phone: [phone]   Fax:  [phone]  Name: Elijah Oliver MRN: [id number] Date of Birth: [birth year]

## 2021-09-03 IMAGING — CT CT ABD-PEL WO/W CM
2 of 6 series · 12 of 32 positions shown, 17 images · IV contrast (omnipaque)
Comparison: None.

CLINICAL DATA: Microscopic hematuria. History of prostate cancer
and bladder cancer post TURBT. History of kidney stones.

EXAM:
CT ABDOMEN AND PELVIS WITHOUT AND WITH CONTRAST
TECHNIQUE: Multidetector CT imaging of the abdomen and pelvis was performed
following the standard protocol before and following the bolus
administration of intravenous contrast.
CONTRAST:  100mL OMNIPAQUE IOHEXOL 300 MG/ML  SOLN

[Series 2: axial pre · axial · non-contrast · 0.85mm/px · z∈[-977,-652]mm · 5 of 115 slices shown]
[im 17/115  soft-tissue]
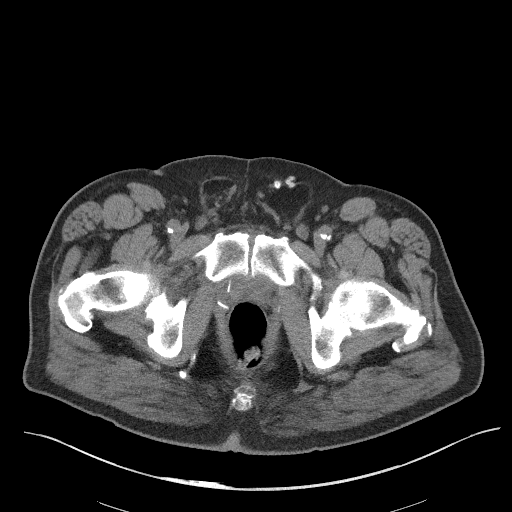
[im 33/115  soft-tissue]
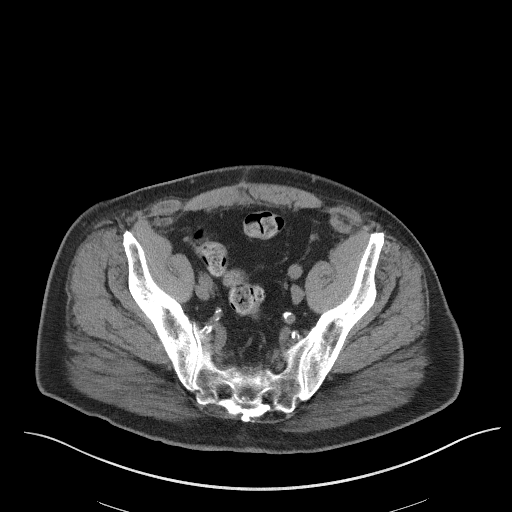
[im 49/115  soft-tissue]
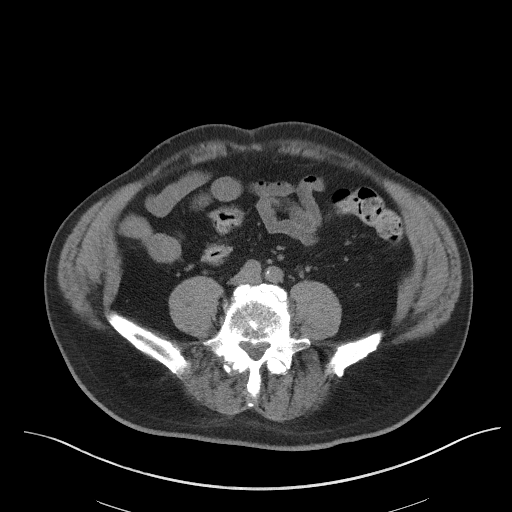
[im 66/115  soft-tissue]
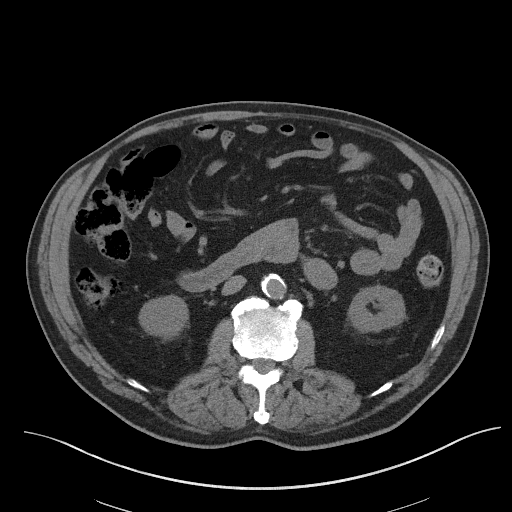
[im 82/115  soft-tissue]
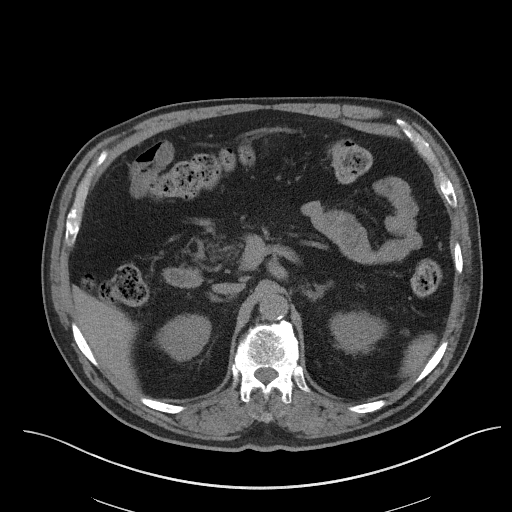

[Series 4: axial post · axial · 0.85mm/px · z∈[-1001,-566]mm · 7 of 117 slices shown, 12 images]
[im 15/117  soft-tissue]
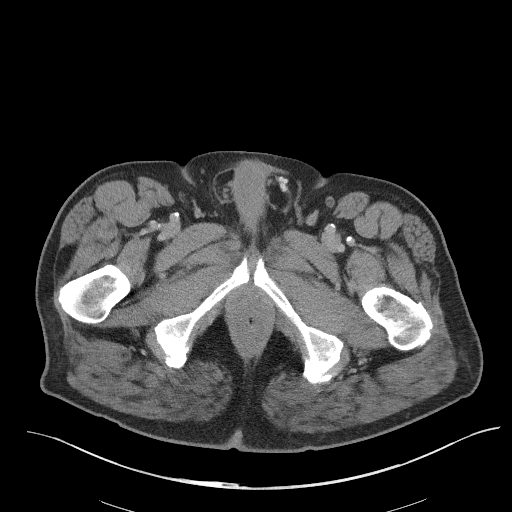
[im 15/117  bone]
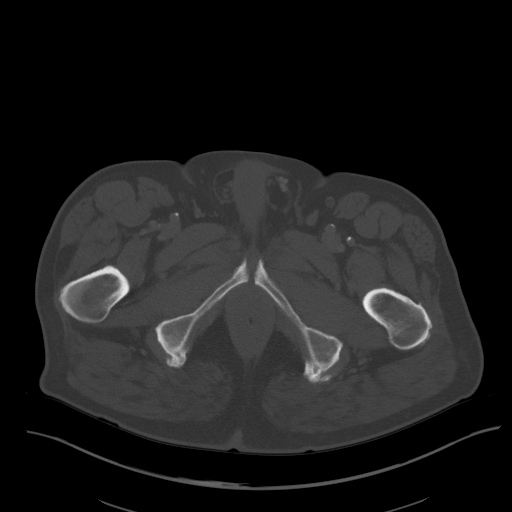
[im 30/117  soft-tissue]
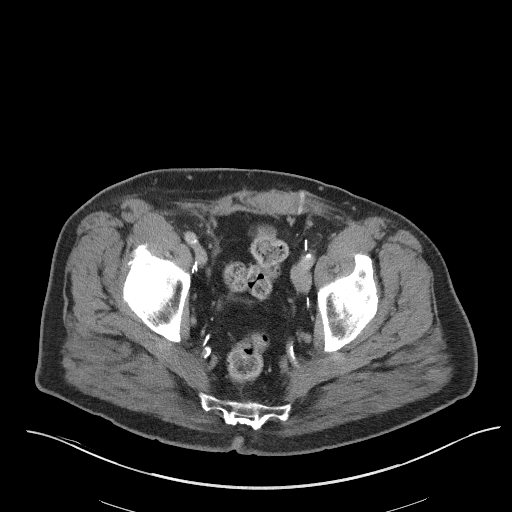
[im 44/117  soft-tissue]
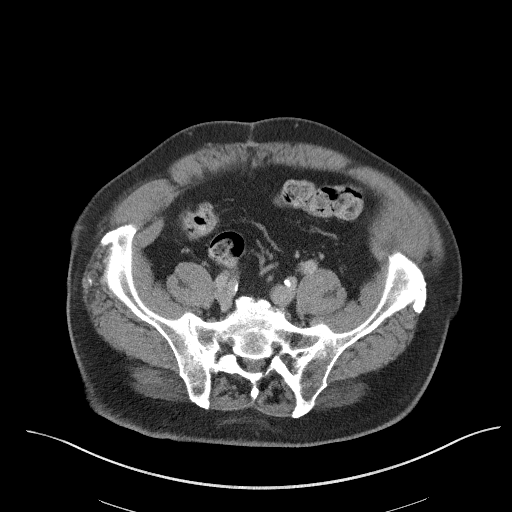
[im 59/117  soft-tissue]
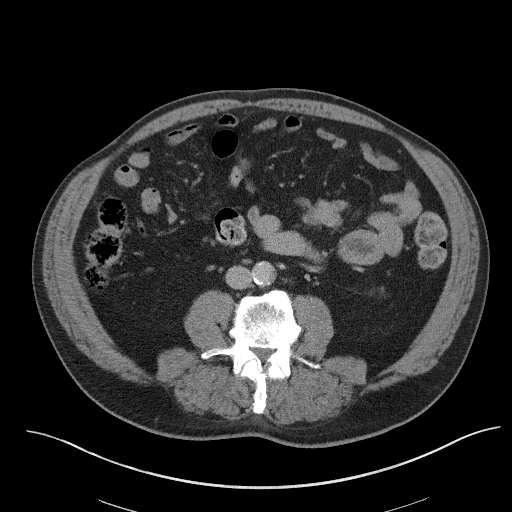
[im 59/117  lung]
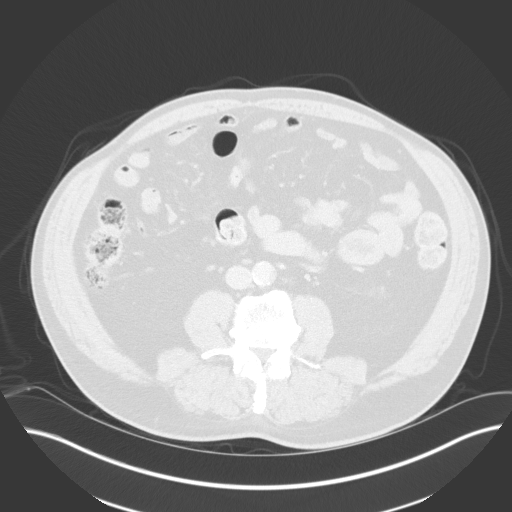
[im 73/117  soft-tissue]
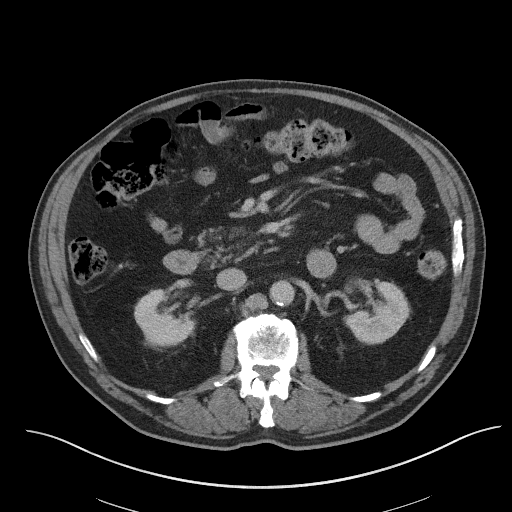
[im 73/117  lung]
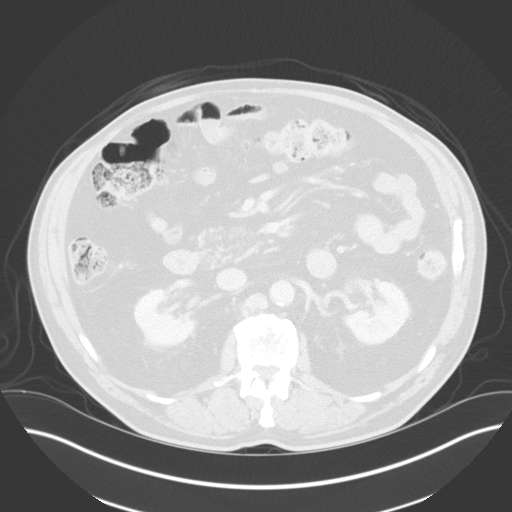
[im 88/117  soft-tissue]
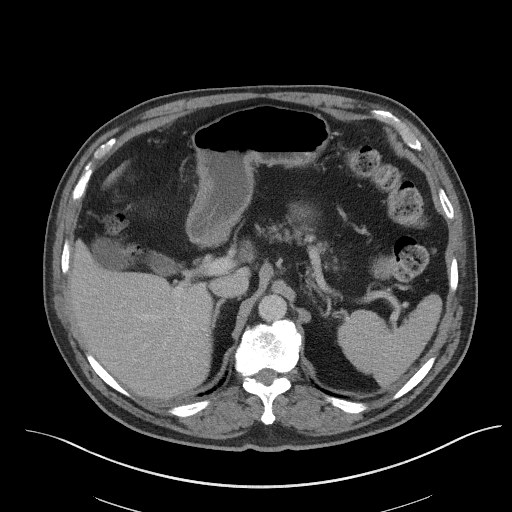
[im 88/117  lung]
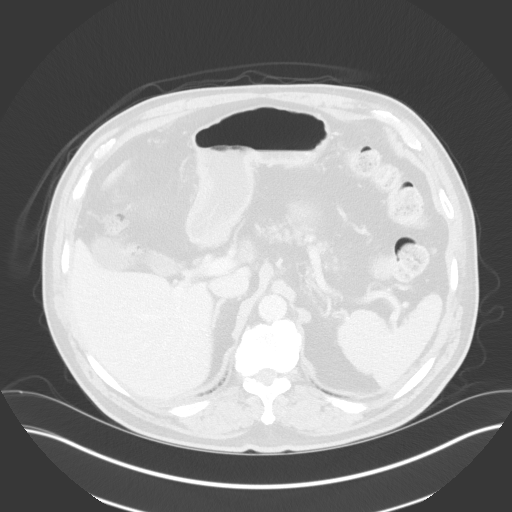
[im 102/117  soft-tissue]
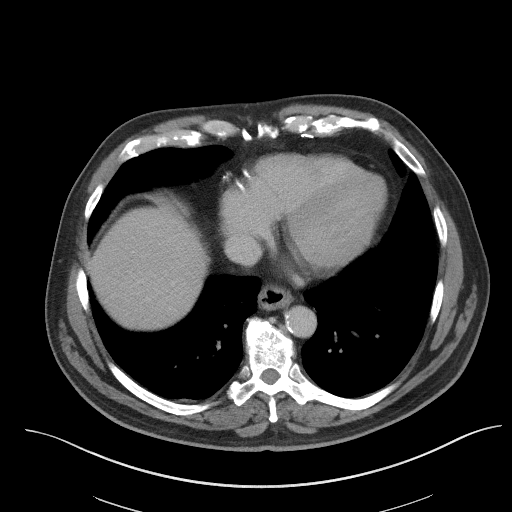
[im 102/117  lung]
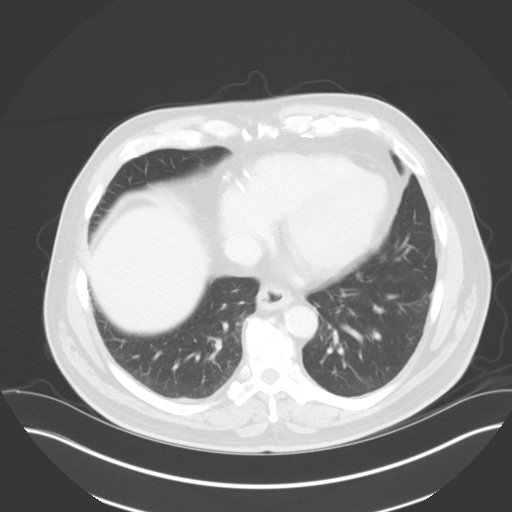

[12 of 32 positions shown; findings below may reference images not displayed]

FINDINGS: Lower chest: Extensive coronary artery atherosclerosis post median
sternotomy and CABG. There are aortic valvular calcifications and
mild cardiomegaly. No significant pleural or pericardial effusion. A
small hiatal hernia is noted. The lung bases appear clear.

Hepatobiliary: The liver is normal in density without suspicious
focal abnormality. No evidence of gallstones, gallbladder wall
thickening or biliary dilatation.

Pancreas: There is fatty replacement of much of the pancreatic
parenchyma. Within the extreme pancreatic tail, there is a possible
solid enhancing mass which measures approximately 2.4 x 1.8 cm on
image 34/4. This contains small calcifications and shows enhancement
following contrast. There is no pancreatic ductal dilatation or
surrounding inflammation.

Spleen: Normal in size without focal abnormality.

Adrenals/Urinary Tract: Pre-contrast images demonstrate no renal,
ureteral or bladder calculi. Post-contrast, both kidneys enhance
normally. There is no evidence of enhancing renal mass. Delayed
images result in segmental visualization of the ureters. No focal
upper tract urothelial abnormalities are identified. There are small
bilateral extrarenal pelves. There is mild wall thickening of the
bladder anteriorly, best seen on coronal images 32 and 33 of series
16.

Stomach/Bowel: No evidence of bowel wall thickening, distention or
surrounding inflammatory change. The appendix appears normal.

Vascular/Lymphatic: There are no enlarged abdominal or pelvic lymph
nodes. Aortic and branch vessel atherosclerosis. The portal,
superior mesenteric and splenic veins are patent.

Reproductive: Status post prostatectomy. Remnants of a penile
implant are noted within the base of the penis and left inguinal
canal.

Other: Postsurgical changes in the low anterior abdominal wall. No
ascites or peritoneal nodularity.

Musculoskeletal: No acute or significant osseous findings. There is
a nonacute appearing superior endplate compression deformity at L1.
Multilevel spondylosis is present.
IMPRESSION: 1. Possible solid enhancing mass in the extreme pancreatic tail.
Correlation with any outside imaging the patient may have would be
helpful. If outside imaging cannot be obtained, recommend further
evaluation with abdominal MRI without and with contrast.
2. No obvious explanation for hematuria identified. There is no
evidence of urinary tract calculus, renal mass or focal upper tract
urothelial lesion.
3. Mild wall thickening of the bladder anteriorly, nonspecific and
potentially related to the patient's prior surgery. Given the
patient's history, cystoscopic evaluation may be warranted.
4. No evidence of metastatic disease.
5. Aortic Atherosclerosis ([id number]-CZX.X).

## 2021-09-04 ENCOUNTER — Other Ambulatory Visit: Payer: Self-pay

## 2021-09-04 ENCOUNTER — Ambulatory Visit: Payer: Medicare Other

## 2021-09-04 DIAGNOSIS — R262 Difficulty in walking, not elsewhere classified: Secondary | ICD-10-CM

## 2021-09-04 DIAGNOSIS — M6281 Muscle weakness (generalized): Secondary | ICD-10-CM

## 2021-09-04 DIAGNOSIS — R269 Unspecified abnormalities of gait and mobility: Secondary | ICD-10-CM

## 2021-09-04 DIAGNOSIS — R2681 Unsteadiness on feet: Secondary | ICD-10-CM | POA: Diagnosis not present

## 2021-09-04 NOTE — Therapy (Signed)
[place] [facility] SERVICES [address] [place], [place], [address] Phone: [phone]   Fax:  [phone]  Physical Therapy Treatment  Patient Details  Name: Elijah Oliver MRN: [id number] Date of Birth: [birth year] Referring Provider (PT): Dion Body, MD   Encounter Date: 09/04/2021   PT End of Session - 09/04/21 0807     Visit Number 35    Number of Visits 103    Date for PT Re-Evaluation 09/24/21    Authorization Type Traditional Medicare A&B    Authorization Time Period Cert: 8/78/6767-01/09/4708; Recert 6/28-36/62    Progress Note Due on Visit 87    PT Start Time 0801    PT Stop Time 0847    PT Time Calculation (min) 46 min    Equipment Utilized During Treatment Gait belt    Activity Tolerance Patient tolerated treatment well    Behavior During Therapy [facility] for tasks assessed/performed             Past Medical History:  Diagnosis Date   Backache 07/23/2017   Benign essential hypertension 07/23/2017   Bladder cancer ([place]) 07/23/2017   Cellulitis of great toe of left foot 07/23/2017   CKD (chronic kidney disease) stage 3, GFR 30-59 ml/min ([place]) 07/06/2017   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   Overview:  Sequential left internal mammary to the mid LAD and distal LAD, SVG to the second obtuse marginal and SVG to the right coronary artery. June 24th, 2006 Dr. 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 07/23/2017   Neck sprain 07/23/2017   Neoplasm of uncertain behavior of skin 07/23/2017   Nephrolithiasis 07/23/2017   Pure hypercholesterolemia 07/02/2017   Seizure disorder ([place]) 07/02/2017   Shoulder strain 07/23/2017   Sleep apnea 07/23/2017   SOB (shortness of breath) 07/23/2017   Thoracic sprain 07/23/2017   Type 2 diabetes mellitus with diabetic neuropathy, without long-term current use of insulin ([place]) 07/02/2017   Urge incontinence of urine  07/23/2017    Past Surgical History:  Procedure Laterality Date   CARDIAC SURGERY  2006   PROSTATECTOMY  2004   REPLACEMENT TOTAL KNEE  2016   TRANSURETHRAL RESECTION OF BLADDER TUMOR WITH GYRUS (TURBT-GYRUS)  2010?   URETEROSCOPY WITH HOLMIUM LASER LITHOTRIPSY  2010?    There were no vitals filed for this visit.   Subjective Assessment - 09/04/21 0806     Subjective Patient reports no significant changes- no falls.    Pertinent History Pt is a pleasant 80 y/o male presenting to PT without an AD and with c/o impaired balance and gait disturbance that started over 4 years ago after his wife passed away from [place]. Per chart pt PMH includes: HTN, HLD, obesity, functional incontinence, pure hypercholesteroliemia, DMII, seizure disorder, CAD involving coronary bypass graft of native heart without angina pectoris (2006), hx of prostate CA, hx bladder CA, CKD, sleep apnea, SOB, knee replacement, per pt he has neuropathy in hands and feet    Limitations Standing;Walking;Lifting;House hold activities;Writing    Currently in Pain? No/denies    Pain Onset 1 to 4 weeks ago             Interventions   Therapeutic Exercises:    Blue TB inversion/eversion 20x each for each LE.  Increased time spent instructing in correct technique. Patient with some difficulty gripping theraband but was able to perform with increased cues and practice and performed well.  Seated DF/PF 25 reps each LE using blue theraband. (VC and visual demo for correct technique using bands)   Neuromuscular Re-education:  In // bars:   Pt standing on 1/2 foam in DF to promote ankle mobility and ankle strategies for several minutes. Pt with no UE support on bar today.  --Progresses to performing with dual cognitive task (recall animals) x several minutes   Pt standing on 1/2 foam in PF to promote ankle mobility and ankle strategies for several minutes. Pt with no UE support on bar today.  (heels on foam, toes touching  floor)       Single leg stance: Maintained balance on left up to 7 sec x 3 trials and 2-3 sec at best x mult attempts standing on right LE.    Reactive postural control training  focus on step-strategy :  Patient performed ant and then posterior  postural control- leaning forward then backward into PT beyond limit of stability and then let go and patient able to consistently use a step strategy forward and later backward well today.    PT provides cuing throughout session primarily in the form of VC/TC/demo to facilitate movement at target joints and improved muscle activation. Pt with good technique following cuing.  Clinical Impression: Patient continues to demo improved reactive postural control with consistent step strategy today and no fall or near falls. Patient  is most limited by limited PF strength and unable to perform a standing calf raise today but was successful with progression to a blue theraband with PF and all other ankle motions. The pt will benefit from further skilled PT to continue to improve LE strength and balance in order to decrease fall risk.                                             PT Education - 09/04/21 0807     Education Details Exercise technique    Person(s) Educated Patient    Methods Explanation;Demonstration;Tactile cues;Verbal cues    Comprehension Verbalized understanding;Returned demonstration;Verbal cues required;Need further instruction              PT Short Term Goals - 07/30/21 0855       PT SHORT TERM GOAL #1   Title Pt will be independent with HEP in order to improve strength and balance in order to decrease fall risk and improve function at home and work.    Baseline 5/25: to be initiated next session; 6/27: pt indep with HEP    Time 6    Period Weeks    Status Achieved    Target Date 06/04/21               PT Long Term Goals - 07/30/21 0855       PT LONG TERM GOAL #1   Title Patient  will increase Berg Balance score by > 6 points to demonstrate decreased fall risk during functional activities.    Baseline 5/25: 34/56; 6/27: 44/56    Time 12    Period Weeks    Status Achieved      PT LONG TERM GOAL #2   Title Pt will improve DGI by at least 3 points in order to demonstrate clinically significant improvement in balance and decreased risk for falls.    Baseline 5/25: 18/24; 6/27: 21/24    Time 12    Period Weeks  Status Achieved      PT LONG TERM GOAL #3   Title Pt will improve ABC by at least 13% in order to demonstrate clinically significant improvement in balance confidence.    Baseline 5/25: 66.87; 6/27: 70.63%; 8/3: 63.14%    Time 12    Period Weeks    Status On-going    Target Date 09/24/21      PT LONG TERM GOAL #4   Title Patient will increase 10 meter walk test to >1.79m/s as to improve gait speed for better community ambulation and to reduce fall risk.    Baseline 5/25: 0.86 m/s; 6/27: 0.97 m/s; 8/3: 1.09 m/s    Time 12    Period Weeks    Status Achieved      PT LONG TERM GOAL #5   Title Patient will increase FOTO score to equal to or greater than 69  to demonstrate statistically significant improvement in mobility and quality of life.    Baseline 5/25: 59; 6/27: 57; 8/3: 55 (pt reports questions not fully relevant to his problem)    Time 12    Period Weeks    Status On-going    Target Date 09/24/21      PT LONG TERM GOAL #6   Title Pt will demonstrate ability to maintain SLB for at least 5 seconds on each LE in order to improve ability to safely navigate up/down curbs and over obstacles.    Baseline 6/27: Pt unable to maintain SLB on either LE; 8/3: LLE 7 sec  RLE 2-3 sec    Time 12    Period Weeks    Status Partially Met    Target Date 09/24/21                   Plan - 09/04/21 1700     Personal Factors and Comorbidities Age;Fitness;Time since onset of injury/illness/exacerbation;Comorbidity 1;Comorbidity 2;Comorbidity 3+     Comorbidities pertinent per chart/pt: DMII, impaired sensation LEs, OA with hx of knee replacement, neuropathy, HTN    Examination-Activity Limitations Bathing;Carry;Lift;Stand;Locomotion Level;Transfers;Hygiene/Grooming;Reach Overhead;Stairs;Bend;Dressing;Squat    Examination-Participation Restrictions Community Activity;Shop;Volunteer;Cleaning;Yard Work;Church;Laundry;Meal Prep    Stability/Clinical Decision Making Stable/Uncomplicated    Rehab Potential Good    PT Frequency 2x / week    PT Duration 8 weeks    PT Treatment/Interventions ADLs/Self Care Home Management;Moist Heat;Cryotherapy;DME Instruction;Gait training;Stair training;Functional mobility training;Therapeutic activities;Therapeutic exercise;Balance training;Neuromuscular re-education;Patient/family education;Orthotic Fit/Training;Manual techniques;Passive range of motion;Energy conservation;Splinting;Dry needling;Taping;Visual/perceptual remediation/compensation;Biofeedback    PT Next Visit Plan perturbation training with focus to left side, dual task balance training LE strengthening (ankle focus), GTB seated PF    PT Home Exercise Plan No updates today; issued thera putty (green) for improved grip strength to increase safety with challenging balance tasks; 8/3: Access Code: [email or web address]    Consulted and Agree with Plan of Care Patient             Patient will benefit from skilled therapeutic intervention in order to improve the following deficits and impairments:  Abnormal gait, Decreased activity tolerance, Improper body mechanics, Impaired sensation, Decreased balance, Decreased coordination, Decreased mobility, Difficulty walking, Impaired vision/preception, Postural dysfunction, Decreased strength, Decreased range of motion, Decreased endurance, Hypomobility, Impaired flexibility, Pain  Visit Diagnosis: Abnormality of gait and mobility  Difficulty in walking, not elsewhere classified  Muscle weakness  (generalized)     Problem List Patient Active Problem List   Diagnosis Date Noted   Systolic murmur 75/91/6384   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  Cellulitis 12/01/2018   Dysarthria 12/01/2018   HTN (hypertension) 12/01/2018   Other hyperlipidemia 12/01/2018   DNR (do not resuscitate) 03/07/2018   Urge incontinence of urine 07/23/2017   Thoracic sprain 07/23/2017   Stye 07/23/2017   SOB (shortness of breath) 07/23/2017   Sleep apnea 07/23/2017   Shoulder strain 07/23/2017   Nephrolithiasis 07/23/2017   Neoplasm of uncertain behavior of skin 07/23/2017   Neck sprain 07/23/2017   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 07/23/2017   Leg swelling 07/23/2017   Ingrowing nail with infection 07/23/2017   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   DM (diabetes mellitus) ([place]) 07/23/2017   Bladder cancer ([place]) 07/23/2017   Benign essential hypertension 07/23/2017   Backache 07/23/2017   Cellulitis of great toe of left foot 07/23/2017   Seizures ([place]) 07/13/2017   CKD (chronic kidney disease) stage 3, GFR 30-59 ml/min ([facility]) 07/06/2017   Type 2 diabetes mellitus with diabetic neuropathy, without long-term current use of insulin ([place]) 07/02/2017   Seizure disorder ([place]) 07/02/2017   Pure hypercholesterolemia 07/02/2017   Obesity (BMI 30.0-34.9) 07/02/2017   History of prostate cancer 07/02/2017   Coronary artery disease involving coronary bypass graft of native heart without angina pectoris 07/02/2017    Lewis Moccasin, PT 09/04/2021, 8:56 AM  [facility] [address] [place], [place], [address] Phone: [phone]   Fax:  [phone]  Name: Elijah Oliver MRN: [id number] Date of Birth: 09-09-1941

## 2021-09-08 ENCOUNTER — Other Ambulatory Visit: Payer: Self-pay

## 2021-09-08 ENCOUNTER — Ambulatory Visit: Payer: Medicare Other

## 2021-09-08 DIAGNOSIS — R2681 Unsteadiness on feet: Secondary | ICD-10-CM | POA: Diagnosis not present

## 2021-09-08 DIAGNOSIS — R262 Difficulty in walking, not elsewhere classified: Secondary | ICD-10-CM

## 2021-09-08 DIAGNOSIS — R269 Unspecified abnormalities of gait and mobility: Secondary | ICD-10-CM

## 2021-09-08 DIAGNOSIS — M6281 Muscle weakness (generalized): Secondary | ICD-10-CM

## 2021-09-08 NOTE — Therapy (Signed)
[place] MAIN [facility] SERVICES [address] [place], [place], [address] Phone: [phone]   Fax:  [phone]  Physical Therapy Treatment  Patient Details  Name: ABDULMALIK Oliver MRN: [id number] Date of Birth: 06/13/1941 Referring Provider (PT): Dion Body, MD   Encounter Date: 09/08/2021   PT End of Session - 09/08/21 0810     Visit Number 33    Number of Visits 87    Date for PT Re-Evaluation 09/24/21    Authorization Type Traditional Medicare A&B    Authorization Time Period Cert: 02/12/4007-6/76/1950; Recert 9/32-67/12    Progress Note Due on Visit 30    PT Start Time 0800    PT Stop Time 0845    PT Time Calculation (min) 45 min    Equipment Utilized During Treatment Gait belt    Activity Tolerance Patient tolerated treatment well    Behavior During Therapy [facility] for tasks assessed/performed             Past Medical History:  Diagnosis Date   Backache 07/23/2017   Benign essential hypertension 07/23/2017   Bladder cancer ([place]) 07/23/2017   Cellulitis of great toe of left foot 07/23/2017   CKD (chronic kidney disease) stage 3, GFR 30-59 ml/min ([place]) 07/06/2017   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   Overview:  Sequential left internal mammary to the mid LAD and distal LAD, SVG to the second obtuse marginal and SVG to the right coronary artery. June 24th, 2006 Dr. 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 07/23/2017   Neck sprain 07/23/2017   Neoplasm of uncertain behavior of skin 07/23/2017   Nephrolithiasis 07/23/2017   Pure hypercholesterolemia 07/02/2017   Seizure disorder ([place]) 07/02/2017   Shoulder strain 07/23/2017   Sleep apnea 07/23/2017   SOB (shortness of breath) 07/23/2017   Thoracic sprain 07/23/2017   Type 2 diabetes mellitus with diabetic neuropathy, without long-term current use of insulin ([place]) 07/02/2017   Urge incontinence of urine  07/23/2017    Past Surgical History:  Procedure Laterality Date   CARDIAC SURGERY  2006   PROSTATECTOMY  2004   REPLACEMENT TOTAL KNEE  2016   TRANSURETHRAL RESECTION OF BLADDER TUMOR WITH GYRUS (TURBT-GYRUS)  2010?   URETEROSCOPY WITH HOLMIUM LASER LITHOTRIPSY  2010?    There were no vitals filed for this visit.   Subjective Assessment - 09/08/21 0807     Subjective Patient reports having a pretty good weekend with no falls and no complaints.    Pertinent History Pt is a pleasant 80 y/o male presenting to PT without an AD and with c/o impaired balance and gait disturbance that started over 4 years ago after his wife passed away from [place]. Per chart pt PMH includes: HTN, HLD, obesity, functional incontinence, pure hypercholesteroliemia, DMII, seizure disorder, CAD involving coronary bypass graft of native heart without angina pectoris (2006), hx of prostate CA, hx bladder CA, CKD, sleep apnea, SOB, knee replacement, per pt he has neuropathy in hands and feet    Limitations Standing;Walking;Lifting;House hold activities;Writing    Currently in Pain? No/denies    Pain Onset 1 to 4 weeks ago                INTERVENTIONS:   Therapeutic Exercises:  Nustep L3 LE only with some difficulty keep feet on foot rest- requiring some assist at times.   Leg press- 25 lbs. BLE x 12 reps with some  difficulty requiring assist from PT to keep Feet on platform.   Calf press- Attempted 25 lb. - Patient with difficulty keeping feet on platform due to neuropathy - Requiring min assist from PT.    Neuromuscular re-education:   Side step over 2 orange hurdles in // bars x 12 down and back- Initially requiring BUE support- able to progress to no UE support yet more difficulty with coordinating LE over the hurdles.   Static stand on rockerboard - Attempting to balance 50/50 yet patient with difficulty placing weight anteriorly.   A/P weight shift on rockerboard- Focusing on weight shifting forward.   Static stand on rockerboard- with horizontal head turns  Forward/backward gait in //bars- focusing on increasing step length.  Static staggered standing (each foot on a separate airex pad) x 5-20- sec each x 4 trials each.   Education provided throughout session via VC/TC and demonstration to facilitate movement at target joints and correct muscle activation for all  exercises performed.    Clinical Impression: Patient with some difficulty with leg/calf press requiring assist to maintain feet on force plate today. He was well motivated and mostly able to use ankle strategy with balance exercises. He struggle with placing weight anteriorly with mostly posterior lean back onto heels. He was responsive to rocker board activity and was able to place weight ant with some cueing and practice. The pt will benefit from further skilled PT to continue to improve LE strength and balance in order to decrease fall risk                   PT Education - 09/08/21 0808     Education Details Exercise technique    Person(s) Educated Patient    Methods Explanation;Demonstration;Tactile cues;Verbal cues;Handout    Comprehension Verbalized understanding;Returned demonstration;Verbal cues required;Need further instruction              PT Short Term Goals - 07/30/21 0855       PT SHORT TERM GOAL #1   Title Pt will be independent with HEP in order to improve strength and balance in order to decrease fall risk and improve function at home and work.    Baseline 5/25: to be initiated next session; 6/27: pt indep with HEP    Time 6    Period Weeks    Status Achieved    Target Date 06/04/21               PT Long Term Goals - 07/30/21 0855       PT LONG TERM GOAL #1   Title Patient will increase Berg Balance score by > 6 points to demonstrate decreased fall risk during functional activities.    Baseline 5/25: 34/56; 6/27: 44/56    Time 12    Period Weeks    Status Achieved       PT LONG TERM GOAL #2   Title Pt will improve DGI by at least 3 points in order to demonstrate clinically significant improvement in balance and decreased risk for falls.    Baseline 5/25: 18/24; 6/27: 21/24    Time 12    Period Weeks    Status Achieved      PT LONG TERM GOAL #3   Title Pt will improve ABC by at least 13% in order to demonstrate clinically significant improvement in balance confidence.    Baseline 5/25: 66.87; 6/27: 70.63%; 8/3: 63.14%    Time 12    Period Weeks    Status  On-going    Target Date 09/24/21      PT LONG TERM GOAL #4   Title Patient will increase 10 meter walk test to >1.53ms as to improve gait speed for better community ambulation and to reduce fall risk.    Baseline 5/25: 0.86 m/s; 6/27: 0.97 m/s; 8/3: 1.09 m/s    Time 12    Period Weeks    Status Achieved      PT LONG TERM GOAL #5   Title Patient will increase FOTO score to equal to or greater than 69  to demonstrate statistically significant improvement in mobility and quality of life.    Baseline 5/25: 59; 6/27: 57; 8/3: 55 (pt reports questions not fully relevant to his problem)    Time 12    Period Weeks    Status On-going    Target Date 09/24/21      PT LONG TERM GOAL #6   Title Pt will demonstrate ability to maintain SLB for at least 5 seconds on each LE in order to improve ability to safely navigate up/down curbs and over obstacles.    Baseline 6/27: Pt unable to maintain SLB on either LE; 8/3: LLE 7 sec  RLE 2-3 sec    Time 12    Period Weeks    Status Partially Met    Target Date 09/24/21                   Plan - 09/08/21 00865    Clinical Impression Statement Patient with some difficulty with leg/calf press requiring assist to maintain feet on force plate today. He was well motivated and mostly able to use ankle strategy with balance exercises. He struggle with placing weight anteriorly with mostly posterior lean back onto heels. He was responsive to rocker board activity  and was able to place weight ant with some cueing and practice. The pt will benefit from further skilled PT to continue to improve LE strength and balance in order to decrease fall risk    Personal Factors and Comorbidities Age;Fitness;Time since onset of injury/illness/exacerbation;Comorbidity 1;Comorbidity 2;Comorbidity 3+    Comorbidities pertinent per chart/pt: DMII, impaired sensation LEs, OA with hx of knee replacement, neuropathy, HTN    Examination-Activity Limitations Bathing;Carry;Lift;Stand;Locomotion Level;Transfers;Hygiene/Grooming;Reach Overhead;Stairs;Bend;Dressing;Squat    Examination-Participation Restrictions Community Activity;Shop;Volunteer;Cleaning;Yard Work;Church;Laundry;Meal Prep    Stability/Clinical Decision Making Stable/Uncomplicated    Rehab Potential Good    PT Frequency 2x / week    PT Duration 8 weeks    PT Treatment/Interventions ADLs/Self Care Home Management;Moist Heat;Cryotherapy;DME Instruction;Gait training;Stair training;Functional mobility training;Therapeutic activities;Therapeutic exercise;Balance training;Neuromuscular re-education;Patient/family education;Orthotic Fit/Training;Manual techniques;Passive range of motion;Energy conservation;Splinting;Dry needling;Taping;Visual/perceptual remediation/compensation;Biofeedback    PT Next Visit Plan perturbation training with focus to left side, dual task balance training LE strengthening (ankle focus), GTB seated PF    PT Home Exercise Plan No updates today; issued thera putty (green) for improved grip strength to increase safety with challenging balance tasks; 8/3: Access Code: [email or web address]   Consulted and Agree with Plan of Care Patient             Patient will benefit from skilled therapeutic intervention in order to improve the following deficits and impairments:  Abnormal gait, Decreased activity tolerance, Improper body mechanics, Impaired sensation, Decreased balance, Decreased coordination, Decreased  mobility, Difficulty walking, Impaired vision/preception, Postural dysfunction, Decreased strength, Decreased range of motion, Decreased endurance, Hypomobility, Impaired flexibility, Pain  Visit Diagnosis: Abnormality of gait and mobility  Difficulty in walking, not elsewhere classified  Muscle weakness (  generalized)  Unsteadiness on feet     Problem List Patient Active Problem List   Diagnosis Date Noted   Systolic murmur 51/08/2110   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   Cellulitis 12/01/2018   Dysarthria 12/01/2018   HTN (hypertension) 12/01/2018   Other hyperlipidemia 12/01/2018   DNR (do not resuscitate) 03/07/2018   Urge incontinence of urine 07/23/2017   Thoracic sprain 07/23/2017   Stye 07/23/2017   SOB (shortness of breath) 07/23/2017   Sleep apnea 07/23/2017   Shoulder strain 07/23/2017   Nephrolithiasis 07/23/2017   Neoplasm of uncertain behavior of skin 07/23/2017   Neck sprain 07/23/2017   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 07/23/2017   Leg swelling 07/23/2017   Ingrowing nail with infection 07/23/2017   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   DM (diabetes mellitus) ([place]) 07/23/2017   Bladder cancer ([place]) 07/23/2017   Benign essential hypertension 07/23/2017   Backache 07/23/2017   Cellulitis of great toe of left foot 07/23/2017   Seizures ([place]) 07/13/2017   CKD (chronic kidney disease) stage 3, GFR 30-59 ml/min ([facility]) 07/06/2017   Type 2 diabetes mellitus with diabetic neuropathy, without long-term current use of insulin ([place]) 07/02/2017   Seizure disorder ([place]) 07/02/2017   Pure hypercholesterolemia 07/02/2017   Obesity (BMI 30.0-34.9) 07/02/2017   History of prostate cancer 07/02/2017   Coronary artery disease involving coronary bypass graft of native heart without angina pectoris 07/02/2017    Lewis Moccasin, PT 09/08/2021, 10:00 AM  [place] MAIN [facility] SERVICES [address] [place], [place], [address] Phone: [phone]   Fax:  [phone]  Name: Elijah Oliver MRN: [id number] Date of Birth: Jul 30, 1941

## 2021-09-10 ENCOUNTER — Other Ambulatory Visit: Payer: Medicare Other

## 2021-09-10 ENCOUNTER — Ambulatory Visit: Payer: Medicare Other

## 2021-09-16 ENCOUNTER — Ambulatory Visit: Payer: Medicare Other

## 2021-09-16 ENCOUNTER — Other Ambulatory Visit: Payer: Self-pay

## 2021-09-16 DIAGNOSIS — R262 Difficulty in walking, not elsewhere classified: Secondary | ICD-10-CM

## 2021-09-16 DIAGNOSIS — R2681 Unsteadiness on feet: Secondary | ICD-10-CM | POA: Diagnosis not present

## 2021-09-16 DIAGNOSIS — R269 Unspecified abnormalities of gait and mobility: Secondary | ICD-10-CM

## 2021-09-16 DIAGNOSIS — M6281 Muscle weakness (generalized): Secondary | ICD-10-CM

## 2021-09-16 NOTE — Therapy (Signed)
[place] MAIN [facility] SERVICES [address] [place], [place], [address] Phone: [phone]   Fax:  [phone]  Physical Therapy Treatment  Patient Details  Name: Elijah Oliver MRN: [id number] Date of Birth: October 02, 1941 Referring Provider (PT): Dion Body, MD   Encounter Date: 09/16/2021   PT End of Session - 09/16/21 0858     Visit Number 34    Number of Visits 23    Date for PT Re-Evaluation 09/24/21    Authorization Type Traditional Medicare A&B    Authorization Time Period Cert: 7/35/6701-03/09/3012; Recert 1/43-88/87    Progress Note Due on Visit 70    PT Start Time 0848    PT Stop Time 0932    PT Time Calculation (min) 44 min    Equipment Utilized During Treatment Gait belt    Activity Tolerance Patient tolerated treatment well    Behavior During Therapy [facility] for tasks assessed/performed             Past Medical History:  Diagnosis Date   Backache 07/23/2017   Benign essential hypertension 07/23/2017   Bladder cancer ([place]) 07/23/2017   Cellulitis of great toe of left foot 07/23/2017   CKD (chronic kidney disease) stage 3, GFR 30-59 ml/min ([place]) 07/06/2017   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   Overview:  Sequential left internal mammary to the mid LAD and distal LAD, SVG to the second obtuse marginal and SVG to the right coronary artery. June 24th, 2006 Dr. 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 07/23/2017   Neck sprain 07/23/2017   Neoplasm of uncertain behavior of skin 07/23/2017   Nephrolithiasis 07/23/2017   Pure hypercholesterolemia 07/02/2017   Seizure disorder ([place]) 07/02/2017   Shoulder strain 07/23/2017   Sleep apnea 07/23/2017   SOB (shortness of breath) 07/23/2017   Thoracic sprain 07/23/2017   Type 2 diabetes mellitus with diabetic neuropathy, without long-term current use of insulin ([place]) 07/02/2017   Urge incontinence of urine  07/23/2017    Past Surgical History:  Procedure Laterality Date   CARDIAC SURGERY  2006   PROSTATECTOMY  2004   REPLACEMENT TOTAL KNEE  2016   TRANSURETHRAL RESECTION OF BLADDER TUMOR WITH GYRUS (TURBT-GYRUS)  2010?   URETEROSCOPY WITH HOLMIUM LASER LITHOTRIPSY  2010?    There were no vitals filed for this visit.   Subjective Assessment - 09/16/21 0856     Subjective Patient reports doing well with no falls. Watched a lot of sports this weekend.    Pertinent History Pt is a pleasant 80 y/o male presenting to PT without an AD and with c/o impaired balance and gait disturbance that started over 4 years ago after his wife passed away from [place]. Per chart pt PMH includes: HTN, HLD, obesity, functional incontinence, pure hypercholesteroliemia, DMII, seizure disorder, CAD involving coronary bypass graft of native heart without angina pectoris (2006), hx of prostate CA, hx bladder CA, CKD, sleep apnea, SOB, knee replacement, per pt he has neuropathy in hands and feet    Limitations Standing;Walking;Lifting;House hold activities;Writing    How long can you sit comfortably? not affected    How long can you stand comfortably? a couple minutes, must hold onto something    How long can you walk comfortably? avoids walking around people, afraid of being bumped into and falling    Diagnostic tests pt reports no recent diagnostic testing, had knee surgery in 2016  Patient Stated Goals Wants to be able to stand without feeling "wobbly" and to improve his walking.    Currently in Pain? No/denies               INTERVENTIONS:   Therapeutic exercises:   Nustep- Interval - Level 0 for 2 min level 2 for 2 min; Level 3 for 2 min; level 5 for 1 min. O2 sat =99%: HR= 79 bpm.  0.33 min  Step tap in //bars without UE support x 15 with mild unsteadiness.    Neuromuscular re-education:    Static stand- 1 foot on 6" step and the other LE on firm surface- eyes open with horizontal Head turns x 10  .  Static stand- 1 foot on 6" step and the other LE on firm surface- eyes closed with horizontal Head turns x 10 . Multiple LOB requiring CGA - mostly lateral LOB  Static stand on firm surface with eyes closed and horizontal head turns. Multiple LOB with more unsteradines with A/P sway.   Forward/retro gait using 3 lb AW in // bars x 10 each direction without UE support. Decreased step length in retro gait but did improve with practice.   Side stepping with hands down by side 3 lb. AW x 10 trips in // bars down and back.   Walking in // bars - with 360 deg turning while walking to other side without UE support x 3 trails- Increased LOB requiring physical assist to keep from falling on 1st trial but only CGA on 2nd/3rd trial.     Limits of stability- reaching forward Pushing foam roll on // bars as far forward until reaction (either reaching with UE for support or step reaction). Patient initially tentative but did improve with reps x 10 each UE.   Education provided throughout session via VC/TC and demonstration to facilitate movement at target joints and correct muscle activation for all testing and exercises performed.   Clinical Impression: Patient challenged with balance activities today - demo visual preference with activities including increased LOB with eyes closed. He was able to adapt and demo improvement with VC and practice today. He demonstrated more A/P unsteadiness with head turns and eyes closed today. The pt will benefit from further skilled PT to continue to improve LE strength and balance in order to decrease fall risk                  PT Short Term Goals - 07/30/21 0855       PT SHORT TERM GOAL #1   Title Pt will be independent with HEP in order to improve strength and balance in order to decrease fall risk and improve function at home and work.    Baseline 5/25: to be initiated next session; 6/27: pt indep with HEP    Time 6    Period Weeks    Status  Achieved    Target Date 06/04/21               PT Long Term Goals - 07/30/21 0855       PT LONG TERM GOAL #1   Title Patient will increase Berg Balance score by > 6 points to demonstrate decreased fall risk during functional activities.    Baseline 5/25: 34/56; 6/27: 44/56    Time 12    Period Weeks    Status Achieved      PT LONG TERM GOAL #2   Title Pt will improve DGI by at least 3 points  in order to demonstrate clinically significant improvement in balance and decreased risk for falls.    Baseline 5/25: 18/24; 6/27: 21/24    Time 12    Period Weeks    Status Achieved      PT LONG TERM GOAL #3   Title Pt will improve ABC by at least 13% in order to demonstrate clinically significant improvement in balance confidence.    Baseline 5/25: 66.87; 6/27: 70.63%; 8/3: 63.14%    Time 12    Period Weeks    Status On-going    Target Date 09/24/21      PT LONG TERM GOAL #4   Title Patient will increase 10 meter walk test to >1.26ms as to improve gait speed for better community ambulation and to reduce fall risk.    Baseline 5/25: 0.86 m/s; 6/27: 0.97 m/s; 8/3: 1.09 m/s    Time 12    Period Weeks    Status Achieved      PT LONG TERM GOAL #5   Title Patient will increase FOTO score to equal to or greater than 69  to demonstrate statistically significant improvement in mobility and quality of life.    Baseline 5/25: 59; 6/27: 57; 8/3: 55 (pt reports questions not fully relevant to his problem)    Time 12    Period Weeks    Status On-going    Target Date 09/24/21      PT LONG TERM GOAL #6   Title Pt will demonstrate ability to maintain SLB for at least 5 seconds on each LE in order to improve ability to safely navigate up/down curbs and over obstacles.    Baseline 6/27: Pt unable to maintain SLB on either LE; 8/3: LLE 7 sec  RLE 2-3 sec    Time 12    Period Weeks    Status Partially Met    Target Date 09/24/21                   Plan - 09/16/21 0858      Clinical Impression Statement Patient challenged with balance activities today - demo visual preference with activities including increased LOB with eyes closed. He was able to adapt and demo improvement with VC and practice today. He demonstrated more A/P unsteadiness with head turns and eyes closed today. The pt will benefit from further skilled PT to continue to improve LE strength and balance in order to decrease fall risk    Personal Factors and Comorbidities Age;Fitness;Time since onset of injury/illness/exacerbation;Comorbidity 1;Comorbidity 2;Comorbidity 3+    Comorbidities pertinent per chart/pt: DMII, impaired sensation LEs, OA with hx of knee replacement, neuropathy, HTN    Examination-Activity Limitations Bathing;Carry;Lift;Stand;Locomotion Level;Transfers;Hygiene/Grooming;Reach Overhead;Stairs;Bend;Dressing;Squat    Examination-Participation Restrictions Community Activity;Shop;Volunteer;Cleaning;Yard Work;Church;Laundry;Meal Prep    Stability/Clinical Decision Making Stable/Uncomplicated    Rehab Potential Good    PT Frequency 2x / week    PT Duration 8 weeks    PT Treatment/Interventions ADLs/Self Care Home Management;Moist Heat;Cryotherapy;DME Instruction;Gait training;Stair training;Functional mobility training;Therapeutic activities;Therapeutic exercise;Balance training;Neuromuscular re-education;Patient/family education;Orthotic Fit/Training;Manual techniques;Passive range of motion;Energy conservation;Splinting;Dry needling;Taping;Visual/perceptual remediation/compensation;Biofeedback    PT Next Visit Plan perturbation training with focus to left side, dual task balance training LE strengthening (ankle focus), GTB seated PF    PT Home Exercise Plan No updates today; issued thera putty (green) for improved grip strength to increase safety with challenging balance tasks; 8/3: Access Code: [email or web address]   Consulted and Agree with Plan of Care Patient  Patient will benefit  from skilled therapeutic intervention in order to improve the following deficits and impairments:  Abnormal gait, Decreased activity tolerance, Improper body mechanics, Impaired sensation, Decreased balance, Decreased coordination, Decreased mobility, Difficulty walking, Impaired vision/preception, Postural dysfunction, Decreased strength, Decreased range of motion, Decreased endurance, Hypomobility, Impaired flexibility, Pain  Visit Diagnosis: Abnormality of gait and mobility  Difficulty in walking, not elsewhere classified  Muscle weakness (generalized)  Unsteadiness on feet     Problem List Patient Active Problem List   Diagnosis Date Noted   Systolic murmur 19/69/4098   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   Cellulitis 12/01/2018   Dysarthria 12/01/2018   HTN (hypertension) 12/01/2018   Other hyperlipidemia 12/01/2018   DNR (do not resuscitate) 03/07/2018   Urge incontinence of urine 07/23/2017   Thoracic sprain 07/23/2017   Stye 07/23/2017   SOB (shortness of breath) 07/23/2017   Sleep apnea 07/23/2017   Shoulder strain 07/23/2017   Nephrolithiasis 07/23/2017   Neoplasm of uncertain behavior of skin 07/23/2017   Neck sprain 07/23/2017   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 07/23/2017   Leg swelling 07/23/2017   Ingrowing nail with infection 07/23/2017   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   DM (diabetes mellitus) ([place]) 07/23/2017   Bladder cancer ([place]) 07/23/2017   Benign essential hypertension 07/23/2017   Backache 07/23/2017   Cellulitis of great toe of left foot 07/23/2017   Seizures ([place]) 07/13/2017   CKD (chronic kidney disease) stage 3, GFR 30-59 ml/min ([facility]CC) 07/06/2017   Type 2 diabetes mellitus with diabetic neuropathy, without long-term current use of insulin ([place]) 07/02/2017   Seizure disorder ([place]) 07/02/2017   Pure hypercholesterolemia 07/02/2017   Obesity (BMI 30.0-34.9) 07/02/2017   History of prostate cancer 07/02/2017    Coronary artery disease involving coronary bypass graft of native heart without angina pectoris 07/02/2017    Lewis Moccasin, PT 09/16/2021, 10:03 AM  [place] MAIN [facility] SERVICES [address] [place], [place], [address] Phone: [phone]   Fax:  [phone]  Name: Elijah Oliver MRN: [id number] Date of Birth: January 04, 1941

## 2021-09-18 ENCOUNTER — Other Ambulatory Visit: Payer: Self-pay

## 2021-09-18 ENCOUNTER — Ambulatory Visit: Payer: Medicare Other

## 2021-09-18 DIAGNOSIS — M6281 Muscle weakness (generalized): Secondary | ICD-10-CM

## 2021-09-18 DIAGNOSIS — R2681 Unsteadiness on feet: Secondary | ICD-10-CM

## 2021-09-18 DIAGNOSIS — R262 Difficulty in walking, not elsewhere classified: Secondary | ICD-10-CM

## 2021-09-18 DIAGNOSIS — R269 Unspecified abnormalities of gait and mobility: Secondary | ICD-10-CM

## 2021-09-18 NOTE — Therapy (Signed)
[place] MAIN [facility] SERVICES [address] [place], [place], [address] Phone: [phone]   Fax:  [phone]  Physical Therapy Treatment  Patient Details  Name: Elijah Oliver MRN: [id number] Date of Birth: Apr 02, 1941 Referring Provider (PT): Dion Body, MD   Encounter Date: 09/18/2021   PT End of Session - 09/18/21 0801     Visit Number 35    Number of Visits 53    Date for PT Re-Evaluation 09/24/21    Authorization Type Traditional Medicare A&B    Authorization Time Period Cert: 07/24/36-0/48/8891; Recert 6/94-50/38    Progress Note Due on Visit 30    PT Start Time 0800    PT Stop Time 0845    PT Time Calculation (min) 45 min    Equipment Utilized During Treatment Gait belt    Activity Tolerance Patient tolerated treatment well    Behavior During Therapy [facility] for tasks assessed/performed             Past Medical History:  Diagnosis Date   Backache 07/23/2017   Benign essential hypertension 07/23/2017   Bladder cancer ([place]) 07/23/2017   Cellulitis of great toe of left foot 07/23/2017   CKD (chronic kidney disease) stage 3, GFR 30-59 ml/min ([place]) 07/06/2017   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   Overview:  Sequential left internal mammary to the mid LAD and distal LAD, SVG to the second obtuse marginal and SVG to the right coronary artery. June 24th, 2006 Dr. 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 07/23/2017   Neck sprain 07/23/2017   Neoplasm of uncertain behavior of skin 07/23/2017   Nephrolithiasis 07/23/2017   Pure hypercholesterolemia 07/02/2017   Seizure disorder ([place]) 07/02/2017   Shoulder strain 07/23/2017   Sleep apnea 07/23/2017   SOB (shortness of breath) 07/23/2017   Thoracic sprain 07/23/2017   Type 2 diabetes mellitus with diabetic neuropathy, without long-term current use of insulin ([place]) 07/02/2017   Urge incontinence of urine  07/23/2017    Past Surgical History:  Procedure Laterality Date   CARDIAC SURGERY  2006   PROSTATECTOMY  2004   REPLACEMENT TOTAL KNEE  2016   TRANSURETHRAL RESECTION OF BLADDER TUMOR WITH GYRUS (TURBT-GYRUS)  2010?   URETEROSCOPY WITH HOLMIUM LASER LITHOTRIPSY  2010?    There were no vitals filed for this visit.   Subjective Assessment - 09/18/21 0800     Subjective Patient reports some days are better than others but reports doing okay today without pain and no report of any falls.    Pertinent History Pt is a pleasant 80 y/o male presenting to PT without an AD and with c/o impaired balance and gait disturbance that started over 4 years ago after his wife passed away from [place]. Per chart pt PMH includes: HTN, HLD, obesity, functional incontinence, pure hypercholesteroliemia, DMII, seizure disorder, CAD involving coronary bypass graft of native heart without angina pectoris (2006), hx of prostate CA, hx bladder CA, CKD, sleep apnea, SOB, knee replacement, per pt he has neuropathy in hands and feet    Limitations Standing;Walking;Lifting;House hold activities;Writing    How long can you sit comfortably? not affected    How long can you stand comfortably? a couple minutes, must hold onto something    How long can you walk comfortably? avoids walking around people, afraid of being bumped into and falling    Diagnostic tests pt reports no recent diagnostic testing, had  knee surgery in 2016    Patient Stated Goals Wants to be able to stand without feeling "wobbly" and to improve his walking.    Currently in Pain? No/denies             INTERVENTIONS:  Neuromuscular re-education:  Dynamic Marching in // bars (standing on blue airex pad) x 25 reps BLE.   Walk the beam- VC for foot placement (patient with difficulty with increased unsteadiness and reaching for UE support)  Side step - walking the beam- Increased A/P sway and intermittent LOB.   Resistive Gait - Retro using Matrix cable  system- 12.5 lb. X 4 trials- Very unsteady with difficulty walking backward- Min assist and VC to try to increase step length.   Static stand with front leg on 6" step and back leg on blue airex pad - hold 5-20 sec then alternate- Intermittent reaching for support bars yet did improve with practice x multiple attempts.   Step up (start by standing on blue airex pad - step up to 6" step) without UE support x 20 reps (alternating LE's) without significant difficulty.   Static stand on blue airex pad with Eyes open- feet narrowed x 10-20 sec x 3 each.  Static stand on blue airex pad with Eyes closed- Patient unable to maintain > 8-10 sec x multiple attempts today.   Pertubations in static standing on blue airex pad in // bars- Ant- post push at shoulders/chest/upper back and down lower along ant/post/lateral hips. Patient did lose his balance the most with post push causing him to lean forward.   Clinical Impression: Patient continues to demo difficulty with dynamic balance activities and difficulty with anterior weight shift forward. He is well motivated with all activities and continues to present as very visually preferenced although he did improve some with practice and able to adapt some to activity. The pt will benefit from further skilled PT to continue to improve LE strength and balance in order to decrease fall risk                               PT Education - 09/18/21 1559     Education Details Exercise technique    Person(s) Educated Patient    Methods Explanation;Demonstration;Tactile cues;Verbal cues    Comprehension Verbalized understanding;Returned demonstration;Verbal cues required;Need further instruction              PT Short Term Goals - 07/30/21 0855       PT SHORT TERM GOAL #1   Title Pt will be independent with HEP in order to improve strength and balance in order to decrease fall risk and improve function at home and work.    Baseline  5/25: to be initiated next session; 6/27: pt indep with HEP    Time 6    Period Weeks    Status Achieved    Target Date 06/04/21               PT Long Term Goals - 07/30/21 0855       PT LONG TERM GOAL #1   Title Patient will increase Berg Balance score by > 6 points to demonstrate decreased fall risk during functional activities.    Baseline 5/25: 34/56; 6/27: 44/56    Time 12    Period Weeks    Status Achieved      PT LONG TERM GOAL #2   Title Pt will improve DGI by  at least 3 points in order to demonstrate clinically significant improvement in balance and decreased risk for falls.    Baseline 5/25: 18/24; 6/27: 21/24    Time 12    Period Weeks    Status Achieved      PT LONG TERM GOAL #3   Title Pt will improve ABC by at least 13% in order to demonstrate clinically significant improvement in balance confidence.    Baseline 5/25: 66.87; 6/27: 70.63%; 8/3: 63.14%    Time 12    Period Weeks    Status On-going    Target Date 09/24/21      PT LONG TERM GOAL #4   Title Patient will increase 10 meter walk test to >1.37m/s as to improve gait speed for better community ambulation and to reduce fall risk.    Baseline 5/25: 0.86 m/s; 6/27: 0.97 m/s; 8/3: 1.09 m/s    Time 12    Period Weeks    Status Achieved      PT LONG TERM GOAL #5   Title Patient will increase FOTO score to equal to or greater than 69  to demonstrate statistically significant improvement in mobility and quality of life.    Baseline 5/25: 59; 6/27: 57; 8/3: 55 (pt reports questions not fully relevant to his problem)    Time 12    Period Weeks    Status On-going    Target Date 09/24/21      PT LONG TERM GOAL #6   Title Pt will demonstrate ability to maintain SLB for at least 5 seconds on each LE in order to improve ability to safely navigate up/down curbs and over obstacles.    Baseline 6/27: Pt unable to maintain SLB on either LE; 8/3: LLE 7 sec  RLE 2-3 sec    Time 12    Period Weeks    Status  Partially Met    Target Date 09/24/21                   Plan - 09/18/21 0804     Clinical Impression Statement Patient continues to demo difficulty with dynamic balance activities and difficulty with anterior weight shift forward. He is well motivated with all activities and continues to present as very visually preferenced although he did improve some with practice and able to adapt some to activity. The pt will benefit from further skilled PT to continue to improve LE strength and balance in order to decrease fall risk    Personal Factors and Comorbidities Age;Fitness;Time since onset of injury/illness/exacerbation;Comorbidity 1;Comorbidity 2;Comorbidity 3+    Comorbidities pertinent per chart/pt: DMII, impaired sensation LEs, OA with hx of knee replacement, neuropathy, HTN    Examination-Activity Limitations Bathing;Carry;Lift;Stand;Locomotion Level;Transfers;Hygiene/Grooming;Reach Overhead;Stairs;Bend;Dressing;Squat    Examination-Participation Restrictions Community Activity;Shop;Volunteer;Cleaning;Yard Work;Church;Laundry;Meal Prep    Stability/Clinical Decision Making Stable/Uncomplicated    Rehab Potential Good    PT Frequency 2x / week    PT Duration 8 weeks    PT Treatment/Interventions ADLs/Self Care Home Management;Moist Heat;Cryotherapy;DME Instruction;Gait training;Stair training;Functional mobility training;Therapeutic activities;Therapeutic exercise;Balance training;Neuromuscular re-education;Patient/family education;Orthotic Fit/Training;Manual techniques;Passive range of motion;Energy conservation;Splinting;Dry needling;Taping;Visual/perceptual remediation/compensation;Biofeedback    PT Next Visit Plan perturbation training with focus to left side, dual task balance training LE strengthening (ankle focus), GTB seated PF    PT Home Exercise Plan No updates today; 8/3: Access Code: [email or web address]    Consulted and Agree with Plan of Care Patient             Patient will  benefit from skilled  therapeutic intervention in order to improve the following deficits and impairments:  Abnormal gait, Decreased activity tolerance, Improper body mechanics, Impaired sensation, Decreased balance, Decreased coordination, Decreased mobility, Difficulty walking, Impaired vision/preception, Postural dysfunction, Decreased strength, Decreased range of motion, Decreased endurance, Hypomobility, Impaired flexibility, Pain  Visit Diagnosis: Abnormality of gait and mobility  Difficulty in walking, not elsewhere classified  Muscle weakness (generalized)  Unsteadiness on feet     Problem List Patient Active Problem List   Diagnosis Date Noted   Systolic murmur 41/66/0630   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   Cellulitis 12/01/2018   Dysarthria 12/01/2018   HTN (hypertension) 12/01/2018   Other hyperlipidemia 12/01/2018   DNR (do not resuscitate) 03/07/2018   Urge incontinence of urine 07/23/2017   Thoracic sprain 07/23/2017   Stye 07/23/2017   SOB (shortness of breath) 07/23/2017   Sleep apnea 07/23/2017   Shoulder strain 07/23/2017   Nephrolithiasis 07/23/2017   Neoplasm of uncertain behavior of skin 07/23/2017   Neck sprain 07/23/2017   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 07/23/2017   Leg swelling 07/23/2017   Ingrowing nail with infection 07/23/2017   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   DM (diabetes mellitus) ([place]) 07/23/2017   Bladder cancer ([place]) 07/23/2017   Benign essential hypertension 07/23/2017   Backache 07/23/2017   Cellulitis of great toe of left foot 07/23/2017   Seizures ([place]) 07/13/2017   CKD (chronic kidney disease) stage 3, GFR 30-59 ml/min ([facility]) 07/06/2017   Type 2 diabetes mellitus with diabetic neuropathy, without long-term current use of insulin ([place]) 07/02/2017   Seizure disorder ([place]) 07/02/2017   Pure hypercholesterolemia 07/02/2017   Obesity (BMI 30.0-34.9) 07/02/2017   History of prostate cancer  07/02/2017   Coronary artery disease involving coronary bypass graft of native heart without angina pectoris 07/02/2017    Lewis Moccasin, PT 09/18/2021, 4:28 PM  [place] MAIN [facility] SERVICES [address] [place], [place], [address] Phone: [phone]   Fax:  [phone]  Name: Elijah Oliver MRN: [id number] Date of Birth: August 20, 1941

## 2021-09-22 ENCOUNTER — Other Ambulatory Visit: Payer: Self-pay

## 2021-09-22 ENCOUNTER — Ambulatory Visit: Payer: Medicare Other | Admitting: Physical Therapy

## 2021-09-22 DIAGNOSIS — R2681 Unsteadiness on feet: Secondary | ICD-10-CM | POA: Diagnosis not present

## 2021-09-22 DIAGNOSIS — R278 Other lack of coordination: Secondary | ICD-10-CM

## 2021-09-22 DIAGNOSIS — R2689 Other abnormalities of gait and mobility: Secondary | ICD-10-CM

## 2021-09-22 DIAGNOSIS — R262 Difficulty in walking, not elsewhere classified: Secondary | ICD-10-CM

## 2021-09-22 DIAGNOSIS — M6281 Muscle weakness (generalized): Secondary | ICD-10-CM

## 2021-09-22 DIAGNOSIS — R269 Unspecified abnormalities of gait and mobility: Secondary | ICD-10-CM

## 2021-09-22 NOTE — Therapy (Signed)
[facility] MAIN [facility] SERVICES [address] [place], [place], [address] Phone: [phone]   Fax:  [phone]  Physical Therapy Treatment  Patient Details  Name: Elijah Oliver MRN: [id number] Date of Birth: 11-08-1941 Referring Provider (PT): Dion Body, MD   Encounter Date: 09/22/2021   PT End of Session - 09/22/21 1444     Visit Number 36    Number of Visits 64    Date for PT Re-Evaluation 09/24/21    Authorization Type Traditional Medicare A&B    Authorization Time Period Cert: 02/20/4009-2/72/5366; Recert 4/40-34/74    Progress Note Due on Visit 38    PT Start Time 0847    PT Stop Time 0930    PT Time Calculation (min) 43 min    Equipment Utilized During Treatment Gait belt    Activity Tolerance Patient tolerated treatment well    Behavior During Therapy [facility] for tasks assessed/performed             Past Medical History:  Diagnosis Date   Backache 07/23/2017   Benign essential hypertension 07/23/2017   Bladder cancer ([place]) 07/23/2017   Cellulitis of great toe of left foot 07/23/2017   CKD (chronic kidney disease) stage 3, GFR 30-59 ml/min ([place]) 07/06/2017   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   Overview:  Sequential left internal mammary to the mid LAD and distal LAD, SVG to the second obtuse marginal and SVG to the right coronary artery. June 24th, 2006 Dr. 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 07/23/2017   Neck sprain 07/23/2017   Neoplasm of uncertain behavior of skin 07/23/2017   Nephrolithiasis 07/23/2017   Pure hypercholesterolemia 07/02/2017   Seizure disorder ([place]) 07/02/2017   Shoulder strain 07/23/2017   Sleep apnea 07/23/2017   SOB (shortness of breath) 07/23/2017   Thoracic sprain 07/23/2017   Type 2 diabetes mellitus with diabetic neuropathy, without long-term current use of insulin ([place]) 07/02/2017   Urge incontinence of urine  07/23/2017    Past Surgical History:  Procedure Laterality Date   CARDIAC SURGERY  2006   PROSTATECTOMY  2004   REPLACEMENT TOTAL KNEE  2016   TRANSURETHRAL RESECTION OF BLADDER TUMOR WITH GYRUS (TURBT-GYRUS)  2010?   URETEROSCOPY WITH HOLMIUM LASER LITHOTRIPSY  2010?    There were no vitals filed for this visit.   Subjective Assessment - 09/22/21 1443     Subjective Patient reports he is "so-so" and that some days are better than others. He reports no falls or LOB since previous session. He does state he had difficulty walking on carpet in church yesterday due to decreased balance.    Pertinent History Pt is a pleasant 80 y/o male presenting to PT without an AD and with c/o impaired balance and gait disturbance that started over 4 years ago after his wife passed away from [place]. Per chart pt PMH includes: HTN, HLD, obesity, functional incontinence, pure hypercholesteroliemia, DMII, seizure disorder, CAD involving coronary bypass graft of native heart without angina pectoris (2006), hx of prostate CA, hx bladder CA, CKD, sleep apnea, SOB, knee replacement, per pt he has neuropathy in hands and feet    Limitations Standing;Walking;Lifting;House hold activities;Writing    How long can you sit comfortably? not affected    How long can you stand comfortably? a couple minutes, must hold onto something    How long can you walk comfortably? avoids walking around people, afraid of  being bumped into and falling    Diagnostic tests pt reports no recent diagnostic testing, had knee surgery in 2016    Patient Stated Goals Wants to be able to stand without feeling "wobbly" and to improve his walking.    Currently in Pain? No/denies               INTERVENTIONS:  Neuromuscular re-education:  Dynamic Marching in // bars (standing on blue airex pad) x 25 reps.  Dynamic Marching in // bars (no pad) x25 reps BLE.   Step-up onto Airex pad in // bars 2 x 25 reps.    Airex beam: Modified tandem  walk, forward only - VC for foot placement (difficulty with increased unsteadiness and reaching for UE support), 4x down and back Side step - walking the beam- increased A/P sway and intermittent LOB, 4x each direction.    Resistive Gait - Retro using Matrix cable system- 12.5 lb. X 1 trial, 7.5 lb x1 trial - Very unsteady with difficulty walking backward - Min assist and VC to try to increase step length.    Seated PF RTB , VC on alignment during eccentric and concentric contraction; 25x BLE    Clinical Impression Patient motivated throughout session and open to feedback. While on compliant surfaces, pt exhibits significant eversion, R>L. PT made pt aware and continued to cue on ankle stability for safety due to pt lack of sensation and awareness at ankles. PT also provided examples of times pt needs to be more aware of ankle stability in the community and during surface changes. Pt responds well to visual feedback via reflexion in the mirror. Most challenging exercise was resisted gait at cable system. Pt had difficulty initiating steps and maintaining position in preparation of next step. Weight was decreased - performance improved however challenge remained. Pt reported BLE fatigue at this point and completed 1 final exercises in seated. He does report he will be for and AFO in November. The pt will benefit from further skilled PT to continue to improve LE strength and balance in order to decrease fall risk.          PT Short Term Goals - 07/30/21 0855       PT SHORT TERM GOAL #1   Title Pt will be independent with HEP in order to improve strength and balance in order to decrease fall risk and improve function at home and work.    Baseline 5/25: to be initiated next session; 6/27: pt indep with HEP    Time 6    Period Weeks    Status Achieved    Target Date 06/04/21               PT Long Term Goals - 07/30/21 0855       PT LONG TERM GOAL #1   Title Patient will increase  Berg Balance score by > 6 points to demonstrate decreased fall risk during functional activities.    Baseline 5/25: 34/56; 6/27: 44/56    Time 12    Period Weeks    Status Achieved      PT LONG TERM GOAL #2   Title Pt will improve DGI by at least 3 points in order to demonstrate clinically significant improvement in balance and decreased risk for falls.    Baseline 5/25: 18/24; 6/27: 21/24    Time 12    Period Weeks    Status Achieved      PT LONG TERM GOAL #3   Title  Pt will improve ABC by at least 13% in order to demonstrate clinically significant improvement in balance confidence.    Baseline 5/25: 66.87; 6/27: 70.63%; 8/3: 63.14%    Time 12    Period Weeks    Status On-going    Target Date 09/24/21      PT LONG TERM GOAL #4   Title Patient will increase 10 meter walk test to >1.81ms as to improve gait speed for better community ambulation and to reduce fall risk.    Baseline 5/25: 0.86 m/s; 6/27: 0.97 m/s; 8/3: 1.09 m/s    Time 12    Period Weeks    Status Achieved      PT LONG TERM GOAL #5   Title Patient will increase FOTO score to equal to or greater than 69  to demonstrate statistically significant improvement in mobility and quality of life.    Baseline 5/25: 59; 6/27: 57; 8/3: 55 (pt reports questions not fully relevant to his problem)    Time 12    Period Weeks    Status On-going    Target Date 09/24/21      PT LONG TERM GOAL #6   Title Pt will demonstrate ability to maintain SLB for at least 5 seconds on each LE in order to improve ability to safely navigate up/down curbs and over obstacles.    Baseline 6/27: Pt unable to maintain SLB on either LE; 8/3: LLE 7 sec  RLE 2-3 sec    Time 12    Period Weeks    Status Partially Met    Target Date 09/24/21                   Plan - 09/22/21 1445     Clinical Impression Statement Patient motivated throughout session and open to feedback. While on compliant surfaces, pt exhibits significant eversion, R>L. PT  made pt aware and continued to cue on ankle stability for safety due to pt lack of sensation and awareness at ankles. PT also provided examples of times pt needs to be more aware of ankle stability in the community and during surface changes. Pt responds well to visual feedback via reflexion in the mirror. Most challenging exercise was resisted gait at cable system. Pt had difficulty initiating steps and maintaining position in preparation of next step. Weight was decreased - performance improved however challenge remained. Pt reported BLE fatigue at this point and completed 1 final exercises in seated. He does report he will be for and AFO in November. The pt will benefit from further skilled PT to continue to improve LE strength and balance in order to decrease fall risk.    Personal Factors and Comorbidities Age;Fitness;Time since onset of injury/illness/exacerbation;Comorbidity 1;Comorbidity 2;Comorbidity 3+    Comorbidities pertinent per chart/pt: DMII, impaired sensation LEs, OA with hx of knee replacement, neuropathy, HTN    Examination-Activity Limitations Bathing;Carry;Lift;Stand;Locomotion Level;Transfers;Hygiene/Grooming;Reach Overhead;Stairs;Bend;Dressing;Squat    Examination-Participation Restrictions Community Activity;Shop;Volunteer;Cleaning;Yard Work;Church;Laundry;Meal Prep    Stability/Clinical Decision Making Stable/Uncomplicated    Rehab Potential Good    PT Frequency 2x / week    PT Duration 8 weeks    PT Treatment/Interventions ADLs/Self Care Home Management;Moist Heat;Cryotherapy;DME Instruction;Gait training;Stair training;Functional mobility training;Therapeutic activities;Therapeutic exercise;Balance training;Neuromuscular re-education;Patient/family education;Orthotic Fit/Training;Manual techniques;Passive range of motion;Energy conservation;Splinting;Dry needling;Taping;Visual/perceptual remediation/compensation;Biofeedback    PT Next Visit Plan perturbation training with focus  to left side, dual task balance training LE strengthening (ankle focus), GTB seated PF    PT Home Exercise Plan No updates today; 8/3: Access  Code: [email or web address]    Consulted and Agree with Plan of Care Patient             Patient will benefit from skilled therapeutic intervention in order to improve the following deficits and impairments:  Abnormal gait, Decreased activity tolerance, Improper body mechanics, Impaired sensation, Decreased balance, Decreased coordination, Decreased mobility, Difficulty walking, Impaired vision/preception, Postural dysfunction, Decreased strength, Decreased range of motion, Decreased endurance, Hypomobility, Impaired flexibility, Pain  Visit Diagnosis: Abnormality of gait and mobility  Difficulty in walking, not elsewhere classified  Other lack of coordination  Unsteadiness on feet  Muscle weakness (generalized)  Other abnormalities of gait and mobility     Problem List Patient Active Problem List   Diagnosis Date Noted   Systolic murmur 29/79/8921   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   Cellulitis 12/01/2018   Dysarthria 12/01/2018   HTN (hypertension) 12/01/2018   Other hyperlipidemia 12/01/2018   DNR (do not resuscitate) 03/07/2018   Urge incontinence of urine 07/23/2017   Thoracic sprain 07/23/2017   Stye 07/23/2017   SOB (shortness of breath) 07/23/2017   Sleep apnea 07/23/2017   Shoulder strain 07/23/2017   Nephrolithiasis 07/23/2017   Neoplasm of uncertain behavior of skin 07/23/2017   Neck sprain 07/23/2017   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 07/23/2017   Leg swelling 07/23/2017   Ingrowing nail with infection 07/23/2017   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   DM (diabetes mellitus) ([place]) 07/23/2017   Bladder cancer ([place]) 07/23/2017   Benign essential hypertension 07/23/2017   Backache 07/23/2017   Cellulitis of great toe of left foot 07/23/2017   Seizures ([place]) 07/13/2017   CKD (chronic kidney  disease) stage 3, GFR 30-59 ml/min ([facility]) 07/06/2017   Type 2 diabetes mellitus with diabetic neuropathy, without long-term current use of insulin ([place]) 07/02/2017   Seizure disorder ([place]) 07/02/2017   Pure hypercholesterolemia 07/02/2017   Obesity (BMI 30.0-34.9) 07/02/2017   History of prostate cancer 07/02/2017   Coronary artery disease involving coronary bypass graft of native heart without angina pectoris 07/02/2017    Patrina Levering PT, DPT  09/22/2021, 2:57 PM  [facility] MAIN [facility] SERVICES [address] [place], [place], [address] Phone: [phone]   Fax:  [phone]  Name: Elijah Oliver MRN: [id number] Date of Birth: 1941-04-15

## 2021-09-24 ENCOUNTER — Ambulatory Visit: Payer: Medicare Other

## 2021-09-24 ENCOUNTER — Other Ambulatory Visit: Payer: Self-pay

## 2021-09-24 DIAGNOSIS — R2681 Unsteadiness on feet: Secondary | ICD-10-CM

## 2021-09-24 DIAGNOSIS — M6281 Muscle weakness (generalized): Secondary | ICD-10-CM

## 2021-09-24 DIAGNOSIS — R2689 Other abnormalities of gait and mobility: Secondary | ICD-10-CM

## 2021-09-24 NOTE — Therapy (Signed)
[place] MAIN [facility] SERVICES [address] [place], [place], [address] Phone: [phone]   Fax:  [phone]  Physical Therapy Treatment  Patient Details  Name: Elijah Oliver MRN: [id number] Date of Birth: [birth year] Referring Provider (PT): Dion Body, MD   Encounter Date: 09/24/2021   PT End of Session - 09/24/21 1032     Visit Number 23    Number of Visits 15    Date for PT Re-Evaluation 09/24/21    Authorization Type Traditional Medicare A&B    Authorization Time Period Cert: 1/88/4166-0/63/0160; Recert 1/09-32/35    Progress Note Due on Visit 35    PT Start Time 0849    PT Stop Time 0930    PT Time Calculation (min) 41 min    Equipment Utilized During Treatment Gait belt    Activity Tolerance Patient tolerated treatment well    Behavior During Therapy [facility] for tasks assessed/performed             Past Medical History:  Diagnosis Date   Backache 07/23/2017   Benign essential hypertension 07/23/2017   Bladder cancer ([place]) 07/23/2017   Cellulitis of great toe of left foot 07/23/2017   CKD (chronic kidney disease) stage 3, GFR 30-59 ml/min ([place]) 07/06/2017   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   Overview:  Sequential left internal mammary to the mid LAD and distal LAD, SVG to the second obtuse marginal and SVG to the right coronary artery. June 24th, 2006 Dr. 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 07/23/2017   Neck sprain 07/23/2017   Neoplasm of uncertain behavior of skin 07/23/2017   Nephrolithiasis 07/23/2017   Pure hypercholesterolemia 07/02/2017   Seizure disorder ([place]) 07/02/2017   Shoulder strain 07/23/2017   Sleep apnea 07/23/2017   SOB (shortness of breath) 07/23/2017   Thoracic sprain 07/23/2017   Type 2 diabetes mellitus with diabetic neuropathy, without long-term current use of insulin ([place]) 07/02/2017   Urge incontinence of urine  07/23/2017    Past Surgical History:  Procedure Laterality Date   CARDIAC SURGERY  2006   PROSTATECTOMY  2004   REPLACEMENT TOTAL KNEE  2016   TRANSURETHRAL RESECTION OF BLADDER TUMOR WITH GYRUS (TURBT-GYRUS)  2010?   URETEROSCOPY WITH HOLMIUM LASER LITHOTRIPSY  2010?    There were no vitals filed for this visit.   Subjective Assessment - 09/24/21 0850     Subjective Pt reports no falls or near falls. Pt reports no pain currently but some pain in his knees with STS.  Pt reports performing HEP. Pt reports sometimes his standing balance is better and sometimes it is not.    Pertinent History Pt is a pleasant 80 y/o male presenting to PT without an AD and with c/o impaired balance and gait disturbance that started over 4 years ago after his wife passed away from [place]. Per chart pt PMH includes: HTN, HLD, obesity, functional incontinence, pure hypercholesteroliemia, DMII, seizure disorder, CAD involving coronary bypass graft of native heart without angina pectoris (2006), hx of prostate CA, hx bladder CA, CKD, sleep apnea, SOB, knee replacement, per pt he has neuropathy in hands and feet    Limitations Standing;Walking;Lifting;House hold activities;Writing    How long can you sit comfortably? not affected    How long can you stand comfortably? a couple minutes, must hold onto something    How long can you walk comfortably? avoids walking around people, afraid of  being bumped into and falling    Diagnostic tests pt reports no recent diagnostic testing, had knee surgery in 2016    Patient Stated Goals Wants to be able to stand without feeling "wobbly" and to improve his walking.    Currently in Pain? No/denies              INTERVENTIONS  Standing NBOS with vertical, horizontal head-turns x multiple reps for eac  Standing on airex pad NBOS performed EO and EC in 30-60 sec bouts  Standing on airex, NBOS with vertical, horizontal head turns, and with lateral flexion (movement in an arc) -  x multiple reps of each. Moving B into lateral flexion most challenging.   Seated DF/PF 2x20 for each  Seated DF with GTB 15x each LE  Reactive postural control training ("trust fall" intervention), focus on step-strategy to L side and posteriorly. Pt leans into PT's hands to L side, PT briefly releases and then provides support again to encourage pt to use step-strategy to regain balance. Pt performs several reps.   SLB progression one foot on airex and one on 6" step. Pt first holds static position for each LE x several minutes then progresses to with use of head turns (horizontal).  Comments: PT BLEs fatigue quickly with interventions. He uses intermittent UE support for majority of interventions and CGA-min a from PT.   Pt educated throughout session about proper posture and technique with exercises. Improved exercise technique, movement at target joints, use of target muscles after min to mod verbal, visual, tactile cues.    PT Education - 09/24/21 1031     Education Details exercise technique, body mechanics    Person(s) Educated Patient    Methods Explanation;Demonstration;Tactile cues;Verbal cues    Comprehension Verbalized understanding;Returned demonstration;Need further instruction              PT Short Term Goals - 07/30/21 0855       PT SHORT TERM GOAL #1   Title Pt will be independent with HEP in order to improve strength and balance in order to decrease fall risk and improve function at home and work.    Baseline 5/25: to be initiated next session; 6/27: pt indep with HEP    Time 6    Period Weeks    Status Achieved    Target Date 06/04/21               PT Long Term Goals - 07/30/21 0855       PT LONG TERM GOAL #1   Title Patient will increase Berg Balance score by > 6 points to demonstrate decreased fall risk during functional activities.    Baseline 5/25: 34/56; 6/27: 44/56    Time 12    Period Weeks    Status Achieved      PT LONG TERM GOAL  #2   Title Pt will improve DGI by at least 3 points in order to demonstrate clinically significant improvement in balance and decreased risk for falls.    Baseline 5/25: 18/24; 6/27: 21/24    Time 12    Period Weeks    Status Achieved      PT LONG TERM GOAL #3   Title Pt will improve ABC by at least 13% in order to demonstrate clinically significant improvement in balance confidence.    Baseline 5/25: 66.87; 6/27: 70.63%; 8/3: 63.14%    Time 12    Period Weeks    Status On-going    Target Date  09/24/21      PT LONG TERM GOAL #4   Title Patient will increase 10 meter walk test to >1.58m/s as to improve gait speed for better community ambulation and to reduce fall risk.    Baseline 5/25: 0.86 m/s; 6/27: 0.97 m/s; 8/3: 1.09 m/s    Time 12    Period Weeks    Status Achieved      PT LONG TERM GOAL #5   Title Patient will increase FOTO score to equal to or greater than 69  to demonstrate statistically significant improvement in mobility and quality of life.    Baseline 5/25: 59; 6/27: 57; 8/3: 55 (pt reports questions not fully relevant to his problem)    Time 12    Period Weeks    Status On-going    Target Date 09/24/21      PT LONG TERM GOAL #6   Title Pt will demonstrate ability to maintain SLB for at least 5 seconds on each LE in order to improve ability to safely navigate up/down curbs and over obstacles.    Baseline 6/27: Pt unable to maintain SLB on either LE; 8/3: LLE 7 sec  RLE 2-3 sec    Time 12    Period Weeks    Status Partially Met    Target Date 09/24/21                   Plan - 09/24/21 1033     Clinical Impression Statement Focus on balance interventions requiring use of head turns to promote use of vestibular cues. Pt still very challenged with performing head turns on compliant surfaces or with narrower bases of support. Pt will benefit from further skilled PT to improve LE strength and balance to decrease fall risk.    Personal Factors and Comorbidities  Age;Fitness;Time since onset of injury/illness/exacerbation;Comorbidity 1;Comorbidity 2;Comorbidity 3+    Comorbidities pertinent per chart/pt: DMII, impaired sensation LEs, OA with hx of knee replacement, neuropathy, HTN    Examination-Activity Limitations Bathing;Carry;Lift;Stand;Locomotion Level;Transfers;Hygiene/Grooming;Reach Overhead;Stairs;Bend;Dressing;Squat    Examination-Participation Restrictions Community Activity;Shop;Volunteer;Cleaning;Yard Work;Church;Laundry;Meal Prep    Stability/Clinical Decision Making Stable/Uncomplicated    Rehab Potential Good    PT Frequency 2x / week    PT Duration 8 weeks    PT Treatment/Interventions ADLs/Self Care Home Management;Moist Heat;Cryotherapy;DME Instruction;Gait training;Stair training;Functional mobility training;Therapeutic activities;Therapeutic exercise;Balance training;Neuromuscular re-education;Patient/family education;Orthotic Fit/Training;Manual techniques;Passive range of motion;Energy conservation;Splinting;Dry needling;Taping;Visual/perceptual remediation/compensation;Biofeedback    PT Next Visit Plan perturbation training with focus to left side, dual task balance training LE strengthening (ankle focus), GTB seated PF    PT Home Exercise Plan No updates today; 8/3: Access Code: [email or web address]    Consulted and Agree with Plan of Care Patient             Patient will benefit from skilled therapeutic intervention in order to improve the following deficits and impairments:  Abnormal gait, Decreased activity tolerance, Improper body mechanics, Impaired sensation, Decreased balance, Decreased coordination, Decreased mobility, Difficulty walking, Impaired vision/preception, Postural dysfunction, Decreased strength, Decreased range of motion, Decreased endurance, Hypomobility, Impaired flexibility, Pain  Visit Diagnosis: Unsteadiness on feet  Other abnormalities of gait and mobility  Muscle weakness (generalized)     Problem  List Patient Active Problem List   Diagnosis Date Noted   Systolic murmur 63/33/5456   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   Cellulitis 12/01/2018   Dysarthria 12/01/2018   HTN (hypertension) 12/01/2018   Other hyperlipidemia 12/01/2018   DNR (do not resuscitate) 03/07/2018  Urge incontinence of urine 07/23/2017   Thoracic sprain 07/23/2017   Stye 07/23/2017   SOB (shortness of breath) 07/23/2017   Sleep apnea 07/23/2017   Shoulder strain 07/23/2017   Nephrolithiasis 07/23/2017   Neoplasm of uncertain behavior of skin 07/23/2017   Neck sprain 07/23/2017   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 07/23/2017   Leg swelling 07/23/2017   Ingrowing nail with infection 07/23/2017   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   DM (diabetes mellitus) ([place]) 07/23/2017   Bladder cancer ([place]) 07/23/2017   Benign essential hypertension 07/23/2017   Backache 07/23/2017   Cellulitis of great toe of left foot 07/23/2017   Seizures ([place]) 07/13/2017   CKD (chronic kidney disease) stage 3, GFR 30-59 ml/min ([facility]) 07/06/2017   Type 2 diabetes mellitus with diabetic neuropathy, without long-term current use of insulin ([place]) 07/02/2017   Seizure disorder ([place]) 07/02/2017   Pure hypercholesterolemia 07/02/2017   Obesity (BMI 30.0-34.9) 07/02/2017   History of prostate cancer 07/02/2017   Coronary artery disease involving coronary bypass graft of native heart without angina pectoris 07/02/2017    Zollie Pee, PT 09/24/2021, 10:39 AM  [facility] [address] [place], [place], [address] Phone: [phone]   Fax:  [phone]  Name: Elijah Oliver MRN: [id number] Date of Birth: 14-Aug-1941

## 2021-09-29 ENCOUNTER — Other Ambulatory Visit: Payer: Self-pay

## 2021-09-29 ENCOUNTER — Ambulatory Visit: Payer: Medicare Other

## 2021-09-29 DIAGNOSIS — R2681 Unsteadiness on feet: Secondary | ICD-10-CM | POA: Diagnosis not present

## 2021-09-29 DIAGNOSIS — R2689 Other abnormalities of gait and mobility: Secondary | ICD-10-CM

## 2021-09-29 DIAGNOSIS — M6281 Muscle weakness (generalized): Secondary | ICD-10-CM

## 2021-09-29 NOTE — Therapy (Signed)
[place] MAIN [facility] SERVICES [address] [place], [place], [address] Phone: [phone]   Fax:  [phone]  Physical Therapy Treatment/DISCHARGE  Patient Details  Name: Elijah Oliver MRN: [id number] Date of Birth: [birth year] Referring Provider (PT): Dion Body, MD   Encounter Date: 09/29/2021   PT End of Session - 09/29/21 0942     Visit Number 84    Number of Visits 83    Date for PT Re-Evaluation 09/24/21    Authorization Type Traditional Medicare A&B    Authorization Time Period Cert: 4/40/1027-2/53/6644; Recert 0/34-74/25    Progress Note Due on Visit 45    PT Start Time 0847    PT Stop Time 0930    PT Time Calculation (min) 43 min    Equipment Utilized During Treatment Gait belt    Activity Tolerance Patient tolerated treatment well    Behavior During Therapy [place] for tasks assessed/performed             Past Medical History:  Diagnosis Date   Backache 07/23/2017   Benign essential hypertension 07/23/2017   Bladder cancer ([place]) 07/23/2017   Cellulitis of great toe of left foot 07/23/2017   CKD (chronic kidney disease) stage 3, GFR 30-59 ml/min ([place]) 07/06/2017   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   Overview:  Sequential left internal mammary to the mid LAD and distal LAD, SVG to the second obtuse marginal and SVG to the right coronary artery. June 24th, 2006 Dr. 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 07/23/2017   Neck sprain 07/23/2017   Neoplasm of uncertain behavior of skin 07/23/2017   Nephrolithiasis 07/23/2017   Pure hypercholesterolemia 07/02/2017   Seizure disorder ([place]) 07/02/2017   Shoulder strain 07/23/2017   Sleep apnea 07/23/2017   SOB (shortness of breath) 07/23/2017   Thoracic sprain 07/23/2017   Type 2 diabetes mellitus with diabetic neuropathy, without long-term current use of insulin ([place]) 07/02/2017   Urge incontinence of  urine 07/23/2017    Past Surgical History:  Procedure Laterality Date   CARDIAC SURGERY  2006   PROSTATECTOMY  2004   REPLACEMENT TOTAL KNEE  2016   TRANSURETHRAL RESECTION OF BLADDER TUMOR WITH GYRUS (TURBT-GYRUS)  2010?   URETEROSCOPY WITH HOLMIUM LASER LITHOTRIPSY  2010?    There were no vitals filed for this visit.   Subjective Assessment - 09/29/21 0851     Subjective Pt reports no pain currently. Pt reports no stumbles, falls. Pt reports he's been doing exercises but not clear on rep range.    Pertinent History Pt is a pleasant 80 y/o male presenting to PT without an AD and with c/o impaired balance and gait disturbance that started over 4 years ago after his wife passed away from [place]. Per chart pt PMH includes: HTN, HLD, obesity, functional incontinence, pure hypercholesteroliemia, DMII, seizure disorder, CAD involving coronary bypass graft of native heart without angina pectoris (2006), hx of prostate CA, hx bladder CA, CKD, sleep apnea, SOB, knee replacement, per pt he has neuropathy in hands and feet    Limitations Standing;Walking;Lifting;House hold activities;Writing    How long can you sit comfortably? not affected    How long can you stand comfortably? a couple minutes, must hold onto something    How long can you walk comfortably? avoids walking around people, afraid of being bumped into and falling    Diagnostic tests pt reports no recent diagnostic testing,  had knee surgery in 2016    Patient Stated Goals Wants to be able to stand without feeling "wobbly" and to improve his walking.    Currently in Pain? No/denies    Pain Score 0-No pain              INTERVENTIONS-  Reassessment of goals and review of HEP for discharge   SLB - pt utilizes warm-up rounds 4 seconds on RLE and 7 seconds on LLE (partially met)  ABC sale 78.8% (partially met)  FOTO: 61 (improved)  Review of HEP  STS technique without UE support x multiple reps; vc/tc and demo provided     Standing hip abduction - 3x12 each LE; cuing to decrease speed/decrease compensation with momentum  Seated heel and toe raises 20x each LE  Tandem stance at support surface 2x30 sec each LE  Instructed pt in progressive endurance training in 5 minute increments over time with use of stationary exercise bike (e.g. nustep) as pt plans to join gym.   Access Code: [email or web address] URL: [email or web address] Date: 09/29/2021 Prepared by: Ricard Dillon  Exercises Sit to Stand Without Arm Support - 1 x daily - 4 x weekly - 2 sets - 12 reps Standing Hip Abduction with Counter Support - 1 x daily - 4 x weekly - 3 sets - 12 reps Seated Heel Toe Raises - 1 x daily - 5 x weekly - 1 sets - 20 reps Seated Heel Raise - 1 x daily - 5 x weekly - 1 sets - 20 reps Standing Tandem Balance with Counter Support - 1 x daily - 7 x weekly - 1 sets - 2 reps - 30 hold Standing Single Leg Stance with Counter Support - 1 x daily - 7 x weekly - 1 sets - 2 reps - 30 hold   Pt educated throughout session about proper posture and technique with exercises. Improved exercise technique, movement at target joints, use of target muscles after min to mod verbal, visual, tactile cues.      PT Education - 09/29/21 0941     Education Details Discharge recommendations, HEP, exercise technique, body mechanics    Person(s) Educated Patient    Methods Explanation;Demonstration;Verbal cues;Handout;Tactile cues    Comprehension Verbalized understanding;Returned demonstration              PT Short Term Goals - 09/29/21 2035       PT SHORT TERM GOAL #1   Title Pt will be independent with HEP in order to improve strength and balance in order to decrease fall risk and improve function at home and work.    Baseline 5/25: to be initiated next session; 6/27: pt indep with HEP    Time 6    Period Weeks    Status Achieved    Target Date 06/04/21               PT Long Term Goals - 09/29/21 0852       PT  LONG TERM GOAL #1   Title Patient will increase Berg Balance score by > 6 points to demonstrate decreased fall risk during functional activities.    Baseline 5/25: 34/56; 6/27: 44/56    Time 12    Period Weeks    Status Achieved      PT LONG TERM GOAL #2   Title Pt will improve DGI by at least 3 points in order to demonstrate clinically significant improvement in balance and decreased risk for falls.    Baseline 5/25: 18/24;  6/27: 21/24    Time 12    Period Weeks    Status Achieved      PT LONG TERM GOAL #3   Title Pt will improve ABC by at least 13% in order to demonstrate clinically significant improvement in balance confidence.    Baseline 5/25: 66.87; 6/27: 70.63%; 8/3: 63.14%; 10/31: 78.8%    Time 12    Period Weeks    Status Partially Met    Target Date 09/29/21      PT LONG TERM GOAL #4   Title Patient will increase 10 meter walk test to >1.34m/s as to improve gait speed for better community ambulation and to reduce fall risk.    Baseline 5/25: 0.86 m/s; 6/27: 0.97 m/s; 8/3: 1.09 m/s    Time 12    Period Weeks    Status Achieved      PT LONG TERM GOAL #5   Title Patient will increase FOTO score to equal to or greater than 69  to demonstrate statistically significant improvement in mobility and quality of life.    Baseline 5/25: 59; 6/27: 57; 8/3: 55 (pt reports questions not fully relevant to his problem); 10/31: 61    Time 12    Period Weeks    Status On-going    Target Date 09/29/21      PT LONG TERM GOAL #6   Title Pt will demonstrate ability to maintain SLB for at least 5 seconds on each LE in order to improve ability to safely navigate up/down curbs and over obstacles.    Baseline 6/27: Pt unable to maintain SLB on either LE; 8/3: LLE 7 sec  RLE 2-3 sec; 10/31: 4 seconds on RLE and 7 seconds on LLE    Time 12    Period Weeks    Status Partially Met    Target Date 09/29/21                   Plan - 09/29/21 9450     Clinical Impression Statement  Goals were reviewed for discharge planning. Pt has partially met single leg balance and ABC scale goals and improved FOTO score since prior assessment. While pt has shown some progress, balance gains have plateaued. Pt consistently performs HEP and feels confident continuing independently. Pt is to be discharged at this time. PT instructed pt to pursue new referral should he experience regression.    Personal Factors and Comorbidities Age;Fitness;Time since onset of injury/illness/exacerbation;Comorbidity 1;Comorbidity 2;Comorbidity 3+    Comorbidities pertinent per chart/pt: DMII, impaired sensation LEs, OA with hx of knee replacement, neuropathy, HTN    Examination-Activity Limitations Bathing;Carry;Lift;Stand;Locomotion Level;Transfers;Hygiene/Grooming;Reach Overhead;Stairs;Bend;Dressing;Squat    Examination-Participation Restrictions Community Activity;Shop;Volunteer;Cleaning;Yard Work;Church;Laundry;Meal Prep    Stability/Clinical Decision Making Stable/Uncomplicated    Rehab Potential Good    PT Frequency 2x / week    PT Duration 8 weeks    PT Treatment/Interventions ADLs/Self Care Home Management;Moist Heat;Cryotherapy;DME Instruction;Gait training;Stair training;Functional mobility training;Therapeutic activities;Therapeutic exercise;Balance training;Neuromuscular re-education;Patient/family education;Orthotic Fit/Training;Manual techniques;Passive range of motion;Energy conservation;Splinting;Dry needling;Taping;Visual/perceptual remediation/compensation;Biofeedback    PT Next Visit Plan perturbation training with focus to left side, dual task balance training LE strengthening (ankle focus), GTB seated PF    PT Home Exercise Plan No updates today; 8/3: Access Code: [email or web address], 09/29/21: [email or web address]    Consulted and Agree with Plan of Care Patient             Patient will benefit from skilled therapeutic intervention in order to improve the following deficits and impairments:  Abnormal  gait,  Decreased activity tolerance, Improper body mechanics, Impaired sensation, Decreased balance, Decreased coordination, Decreased mobility, Difficulty walking, Impaired vision/preception, Postural dysfunction, Decreased strength, Decreased range of motion, Decreased endurance, Hypomobility, Impaired flexibility, Pain  Visit Diagnosis: Unsteadiness on feet  Muscle weakness (generalized)  Other abnormalities of gait and mobility     Problem List Patient Active Problem List   Diagnosis Date Noted   Systolic murmur 31/49/7026   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   Cellulitis 12/01/2018   Dysarthria 12/01/2018   HTN (hypertension) 12/01/2018   Other hyperlipidemia 12/01/2018   DNR (do not resuscitate) 03/07/2018   Urge incontinence of urine 07/23/2017   Thoracic sprain 07/23/2017   Stye 07/23/2017   SOB (shortness of breath) 07/23/2017   Sleep apnea 07/23/2017   Shoulder strain 07/23/2017   Nephrolithiasis 07/23/2017   Neoplasm of uncertain behavior of skin 07/23/2017   Neck sprain 07/23/2017   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 07/23/2017   Leg swelling 07/23/2017   Ingrowing nail with infection 07/23/2017   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   DM (diabetes mellitus) ([place]) 07/23/2017   Bladder cancer ([place]) 07/23/2017   Benign essential hypertension 07/23/2017   Backache 07/23/2017   Cellulitis of great toe of left foot 07/23/2017   Seizures ([place]) 07/13/2017   CKD (chronic kidney disease) stage 3, GFR 30-59 ml/min ([facility]) 07/06/2017   Type 2 diabetes mellitus with diabetic neuropathy, without long-term current use of insulin ([place]) 07/02/2017   Seizure disorder ([place]) 07/02/2017   Pure hypercholesterolemia 07/02/2017   Obesity (BMI 30.0-34.9) 07/02/2017   History of prostate cancer 07/02/2017   Coronary artery disease involving coronary bypass graft of native heart without angina pectoris 07/02/2017    Zollie Pee, PT 09/29/2021, 9:51 AM  [place] MAIN [facility] SERVICES [address] [place], [place], [address] Phone: [phone]   Fax:  [phone]  Name: RAJAN BURGARD MRN: [id number] Date of Birth: 1941-01-22

## 2021-10-01 ENCOUNTER — Ambulatory Visit: Payer: Medicare Other

## 2021-10-06 ENCOUNTER — Ambulatory Visit: Payer: Medicare Other

## 2021-10-08 ENCOUNTER — Ambulatory Visit: Payer: Medicare Other

## 2021-10-08 ENCOUNTER — Other Ambulatory Visit: Payer: Medicare Other

## 2021-10-08 ENCOUNTER — Other Ambulatory Visit: Payer: Self-pay

## 2021-10-13 ENCOUNTER — Ambulatory Visit: Payer: Medicare Other

## 2021-10-15 ENCOUNTER — Ambulatory Visit: Payer: Medicare Other

## 2021-10-20 ENCOUNTER — Ambulatory Visit: Payer: Medicare Other

## 2021-10-22 ENCOUNTER — Ambulatory Visit: Payer: Medicare Other

## 2021-10-27 ENCOUNTER — Ambulatory Visit: Payer: Medicare Other

## 2021-10-29 ENCOUNTER — Ambulatory Visit: Payer: Medicare Other

## 2021-10-30 ENCOUNTER — Encounter: Payer: Self-pay | Admitting: Podiatry

## 2021-10-30 ENCOUNTER — Ambulatory Visit (INDEPENDENT_AMBULATORY_CARE_PROVIDER_SITE_OTHER): Payer: Medicare Other | Admitting: Podiatry

## 2021-10-30 ENCOUNTER — Other Ambulatory Visit: Payer: Self-pay

## 2021-10-30 DIAGNOSIS — M79675 Pain in left toe(s): Secondary | ICD-10-CM

## 2021-10-30 DIAGNOSIS — M79674 Pain in right toe(s): Secondary | ICD-10-CM | POA: Diagnosis not present

## 2021-10-30 DIAGNOSIS — M2041 Other hammer toe(s) (acquired), right foot: Secondary | ICD-10-CM

## 2021-10-30 DIAGNOSIS — B351 Tinea unguium: Secondary | ICD-10-CM

## 2021-10-30 DIAGNOSIS — M2042 Other hammer toe(s) (acquired), left foot: Secondary | ICD-10-CM

## 2021-10-30 DIAGNOSIS — M2030 Hallux varus (acquired), unspecified foot: Secondary | ICD-10-CM

## 2021-10-30 DIAGNOSIS — E1142 Type 2 diabetes mellitus with diabetic polyneuropathy: Secondary | ICD-10-CM

## 2021-10-30 NOTE — Progress Notes (Signed)
This patient returns to my office for at risk foot care.  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  This patient is unable to cut nails himself since the patient cannot reach his nails.These nails are painful walking and wearing shoes.  This patient presents for at risk foot care today.  General Appearance  Alert, conversant and in no acute stress.  Vascular  Dorsalis pedis and posterior tibial  pulses are palpable  bilaterally.  Capillary return is within normal limits  bilaterally. Temperature is within normal limits  bilaterally.  Neurologic  Senn-Weinstein monofilament wire test diminished  bilaterally. Muscle power within normal limits bilaterally.  Nails Thick disfigured discolored nails with subungual debris  from hallux to fifth toes bilaterally. No evidence of bacterial infection or drainage bilaterally.  Orthopedic  No limitations of motion  feet .  No crepitus or effusions noted.  No bony pathology or digital deformities noted.  Hallux malleus  Hammer toes  B/L.  Skin  normotropic skin with no porokeratosis noted bilaterally.  No signs of infections or ulcers noted.     Onychomycosis  Pain in right toes  Pain in left toes  Consent was obtained for treatment procedures.   Mechanical debridement of nails 1-5  bilaterally performed with a nail nipper.  Filed with dremel without incident.  Patient qualifies for diabetic shoes due to DPN, hallux malleus and hammer toes.  Return office visit   3 months                   Told patient to return for periodic foot care and evaluation due to potential at risk complications.   Gardiner Barefoot DPM

## 2021-11-03 ENCOUNTER — Ambulatory Visit: Payer: Medicare Other

## 2021-11-05 ENCOUNTER — Ambulatory Visit: Payer: Medicare Other

## 2021-11-10 ENCOUNTER — Ambulatory Visit: Payer: Medicare Other

## 2021-11-12 ENCOUNTER — Ambulatory Visit: Payer: Medicare Other

## 2021-11-14 ENCOUNTER — Other Ambulatory Visit: Payer: Self-pay

## 2021-11-14 ENCOUNTER — Ambulatory Visit: Payer: Medicare Other

## 2021-11-14 DIAGNOSIS — M2041 Other hammer toe(s) (acquired), right foot: Secondary | ICD-10-CM

## 2021-11-14 DIAGNOSIS — E1142 Type 2 diabetes mellitus with diabetic polyneuropathy: Secondary | ICD-10-CM

## 2021-11-14 DIAGNOSIS — M2042 Other hammer toe(s) (acquired), left foot: Secondary | ICD-10-CM

## 2021-11-14 DIAGNOSIS — R2689 Other abnormalities of gait and mobility: Secondary | ICD-10-CM | POA: Diagnosis not present

## 2021-11-14 DIAGNOSIS — R2681 Unsteadiness on feet: Secondary | ICD-10-CM

## 2021-11-14 NOTE — Progress Notes (Signed)
SITUATION Reason for Visit: Dispensation and Fitting of [place] Patient Report: Patient reports comfort in ambulation and understands all instructions.  OBJECTIVE DATA Patient History / Diagnosis:  No Change in Pathology Provided Device:  Custom Molded Gauntlet: Style [place] Balance Brace bilateral  Goals of Orthosis: - Improve gait - Decrease energy expenditure during the gait cycle - Improve balance - Stabilize motion at ankle and subtalar joint - Compensate for muscle weakness - Facilitate motion - Provide triplanar ankle and foot stabilization for weight bearing activities  Device Justification: - Patient is ambulatory  - Device is medically necessary as part of the overall treatment due to the patient's condition and related symptoms - It is anticipated that the patient will benefit functionally with use of the device.  - The custom device is utilized in an attempt to avoid the need for surgery and because a prefabricated device is inappropriate.  Upon gait analysis, the device appeared to be fitting well and the patient states that the device is comfortable.  ACTIONS PERFORMED Patient was fit with bilateral Custom Molded Gauntlets. Patient tolerated fitting procedure. Fit of the device is good. Patient was able to apply properly and ambulate without distress. Device function is to restrict and limit motion and provide stabilization in the ankle joint.   Goals and function of this device were explained in detail to the patient. The patient was shown how to properly apply, wear, and care for the device. It was explained that the device will fit and function best in an adjustable-closure shoe with a firm heel counter and a wide base of support. When the device was dispensed, it was suitable for the patient's condition and not substandard. No guarantees were given. Precautions were reviewed.   Written instructions, warranty information, and a copy of DMEPOS Supplier  Standards were provided. All questions answered and concerns addressed.  PLAN Patient is to follow up in one week or as necessary (PRN). Plan of care was discussed with and agreed upon by patient.

## 2021-11-17 ENCOUNTER — Ambulatory Visit: Payer: Medicare Other

## 2021-11-19 ENCOUNTER — Ambulatory Visit: Payer: Medicare Other

## 2021-11-25 ENCOUNTER — Ambulatory Visit: Payer: Medicare Other

## 2021-11-27 ENCOUNTER — Ambulatory Visit: Payer: Medicare Other

## 2022-01-01 ENCOUNTER — Telehealth: Payer: Self-pay | Admitting: Podiatry

## 2022-01-01 NOTE — Telephone Encounter (Signed)
Pt left message asking about status of his diabetic shoes.Marland Kitchen  Upon checking the documents from pcp are expired and I notfied pt and he stated he was seen 1.12.23 by the pcp. I told pt I would refax documents with a note now and I have.  Pt was also saying the the brace he got is not working. He is not able to put the braces on by himself. I have scheduled him in [place] to see Aaron Edelman for the braces.

## 2022-01-12 ENCOUNTER — Ambulatory Visit: Payer: Medicare Other

## 2022-01-12 ENCOUNTER — Other Ambulatory Visit: Payer: Self-pay

## 2022-01-12 DIAGNOSIS — E1142 Type 2 diabetes mellitus with diabetic polyneuropathy: Secondary | ICD-10-CM

## 2022-01-12 DIAGNOSIS — M2030 Hallux varus (acquired), unspecified foot: Secondary | ICD-10-CM

## 2022-01-12 NOTE — Progress Notes (Signed)
SITUATION Reason for Consult: Follow-up with bilateral balance braces Patient / Caregiver Report: Patient has difficulty donning and doffing  OBJECTIVE DATA History / Diagnosis:    ICD-10-CM   1. Diabetic polyneuropathy associated with type 2 diabetes mellitus (HCC)  E11.42     2. Hallux malleus, unspecified laterality  M20.30       Change in Pathology: None  ACTIONS PERFORMED Patient's equipment was checked for structural stability and fit. Fit braces underneith foot orthotics and instructed patient in donning and doffing. Verified that patient can don and doff independently. Device(s) intact and fit is excellent. All questions answered and concerns addressed.  PLAN Follow-up as needed (PRN). Plan of care discussed with and agreed upon by patient / caregiver.

## 2022-01-29 ENCOUNTER — Encounter: Payer: Self-pay | Admitting: Podiatry

## 2022-01-29 ENCOUNTER — Ambulatory Visit: Payer: Medicare Other | Admitting: Podiatry

## 2022-01-29 ENCOUNTER — Other Ambulatory Visit: Payer: Self-pay

## 2022-01-29 DIAGNOSIS — M79674 Pain in right toe(s): Secondary | ICD-10-CM | POA: Diagnosis not present

## 2022-01-29 DIAGNOSIS — B351 Tinea unguium: Secondary | ICD-10-CM

## 2022-01-29 DIAGNOSIS — M79675 Pain in left toe(s): Secondary | ICD-10-CM

## 2022-01-29 DIAGNOSIS — E1142 Type 2 diabetes mellitus with diabetic polyneuropathy: Secondary | ICD-10-CM | POA: Diagnosis not present

## 2022-01-29 DIAGNOSIS — M2041 Other hammer toe(s) (acquired), right foot: Secondary | ICD-10-CM

## 2022-01-29 DIAGNOSIS — M2030 Hallux varus (acquired), unspecified foot: Secondary | ICD-10-CM

## 2022-01-29 DIAGNOSIS — M2042 Other hammer toe(s) (acquired), left foot: Secondary | ICD-10-CM

## 2022-01-29 NOTE — Progress Notes (Signed)
This patient returns to my office for at risk foot care.  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  This patient is unable to cut nails himself since the patient cannot reach his nails.These nails are painful walking and wearing shoes.  This patient presents for at risk foot care today. ? ?General Appearance  Alert, conversant and in no acute stress. ? ?Vascular  Dorsalis pedis and posterior tibial  pulses are palpable  bilaterally.  Capillary return is within normal limits  bilaterally. Temperature is within normal limits  bilaterally. ? ?Neurologic  Senn-Weinstein monofilament wire test diminished  bilaterally. Muscle power within normal limits bilaterally. ? ?Nails Thick disfigured discolored nails with subungual debris  from hallux to fifth toes bilaterally. No evidence of bacterial infection or drainage bilaterally. ? ?Orthopedic  No limitations of motion  feet .  No crepitus or effusions noted.  No bony pathology or digital deformities noted.  Hallux malleus  Hammer toes  B/L. ? ?Skin  normotropic skin with no porokeratosis noted bilaterally.  No signs of infections or ulcers noted.    ? ?Onychomycosis  Pain in right toes  Pain in left toes ? ?Consent was obtained for treatment procedures.   Mechanical debridement of nails 1-5  bilaterally performed with a nail nipper.  Filed with dremel without incident.   ? ?Return office visit   3 months                   Told patient to return for periodic foot care and evaluation due to potential at risk complications. ? ? ?Gardiner Barefoot DPM  ?

## 2022-03-11 ENCOUNTER — Telehealth: Payer: Self-pay

## 2022-03-11 NOTE — Telephone Encounter (Signed)
Diabetic shoes and inserts ready for fitting - left vm for patient to return call and schedule appointment for pickup ?

## 2022-03-20 ENCOUNTER — Ambulatory Visit: Payer: Medicare Other

## 2022-03-20 DIAGNOSIS — M2030 Hallux varus (acquired), unspecified foot: Secondary | ICD-10-CM | POA: Diagnosis not present

## 2022-03-20 DIAGNOSIS — M2041 Other hammer toe(s) (acquired), right foot: Secondary | ICD-10-CM

## 2022-03-20 DIAGNOSIS — E1142 Type 2 diabetes mellitus with diabetic polyneuropathy: Secondary | ICD-10-CM | POA: Diagnosis not present

## 2022-03-20 DIAGNOSIS — M2042 Other hammer toe(s) (acquired), left foot: Secondary | ICD-10-CM

## 2022-03-20 NOTE — Progress Notes (Signed)
SITUATION ?Reason for Visit: Fitting of Diabetic [place] ?Patient / Caregiver Report:  Patient is satisfied with fit and function of shoes and insoles. ? ?OBJECTIVE DATA: ?Patient History / Diagnosis:   ?  ICD-10-CM   ?1. Diabetic polyneuropathy associated with type 2 diabetes mellitus ([place])  E11.42   ?  ?2. Hammer toes of both feet  M20.41   ? M20.42   ?  ?3. Hallux malleus, unspecified laterality  M20.30   ?  ? ? ?Change in Status:   None ? ?ACTIONS PERFORMED: ?In-Person Delivery, patient was fit with: ?- 1x pair [id number] PDAC approved prefabricated Diabetic Shoes: Apex G8010M White 10.5W ?- 3x pair [id number] PDAC approved vacuum formed custom diabetic insoles; SafeStep [phone] ? ?Shoes and insoles were verified for structural integrity and safety. Patient wore shoes and insoles in office. Skin was inspected and free of areas of concern after wearing shoes and inserts. Shoes and inserts fit properly. Patient / Caregiver provided with ferbal instruction and demonstration regarding donning, doffing, wear, care, proper fit, function, purpose, cleaning, and use of shoes and insoles ' and in all related precautions and risks and benefits regarding shoes and insoles. Patient / Caregiver was instructed to wear properly fitting socks with shoes at all times. Patient was also provided with verbal instruction regarding how to report any failures or malfunctions of shoes or inserts, and necessary follow up care. Patient / Caregiver was also instructed to contact physician regarding change in status that may affect function of shoes and inserts.  ? ?Patient / Caregiver verbalized undersatnding of instruction provided. Patient / Caregiver demonstrated independence with proper donning and doffing of shoes and inserts. ? ?PLAN ?Patient to follow with treating physician as recommended. Plan of care was discussed with and agreed upon by patient and/or caregiver. All questions were answered and concerns addressed. ? ?

## 2022-05-07 ENCOUNTER — Ambulatory Visit: Payer: Medicare Other | Admitting: Podiatry

## 2022-05-14 ENCOUNTER — Ambulatory Visit (INDEPENDENT_AMBULATORY_CARE_PROVIDER_SITE_OTHER): Payer: Medicare Other | Admitting: Podiatry

## 2022-05-14 ENCOUNTER — Encounter: Payer: Self-pay | Admitting: Podiatry

## 2022-05-14 DIAGNOSIS — M79674 Pain in right toe(s): Secondary | ICD-10-CM | POA: Diagnosis not present

## 2022-05-14 DIAGNOSIS — B351 Tinea unguium: Secondary | ICD-10-CM | POA: Diagnosis not present

## 2022-05-14 DIAGNOSIS — M79675 Pain in left toe(s): Secondary | ICD-10-CM

## 2022-05-14 DIAGNOSIS — E1142 Type 2 diabetes mellitus with diabetic polyneuropathy: Secondary | ICD-10-CM

## 2022-05-14 DIAGNOSIS — M2030 Hallux varus (acquired), unspecified foot: Secondary | ICD-10-CM

## 2022-05-14 NOTE — Progress Notes (Signed)
This patient returns to my office for at risk foot care.  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  This patient is unable to cut nails himself since the patient cannot reach his nails.These nails are painful walking and wearing shoes.  This patient presents for at risk foot care today.  General Appearance  Alert, conversant and in no acute stress.  Vascular  Dorsalis pedis and posterior tibial  pulses are palpable  bilaterally.  Capillary return is within normal limits  bilaterally. Temperature is within normal limits  bilaterally.  Neurologic  Senn-Weinstein monofilament wire test diminished  bilaterally. Muscle power within normal limits bilaterally.  Nails Thick disfigured discolored nails with subungual debris  from hallux to fifth toes bilaterally. No evidence of bacterial infection or drainage bilaterally.  Orthopedic  No limitations of motion  feet .  No crepitus or effusions noted.  No bony pathology or digital deformities noted.  Hallux malleus  Hammer toes  B/L.  Skin  normotropic skin with no porokeratosis noted bilaterally.  No signs of infections or ulcers noted.     Onychomycosis  Pain in right toes  Pain in left toes  Consent was obtained for treatment procedures.   Mechanical debridement of nails 1-5  bilaterally performed with a nail nipper.  Filed with dremel without incident.    Return office visit   3 months                   Told patient to return for periodic foot care and evaluation due to potential at risk complications.   Gardiner Barefoot DPM

## 2022-05-20 ENCOUNTER — Other Ambulatory Visit: Payer: Self-pay

## 2022-05-20 ENCOUNTER — Other Ambulatory Visit
Admission: RE | Admit: 2022-05-20 | Discharge: 2022-05-20 | Disposition: A | Discharge status: Home / Self Care | Payer: Medicare Other | Attending: Urology | Admitting: Urology

## 2022-05-20 DIAGNOSIS — Z8551 Personal history of malignant neoplasm of bladder: Secondary | ICD-10-CM

## 2022-05-20 DIAGNOSIS — Z8546 Personal history of malignant neoplasm of prostate: Secondary | ICD-10-CM | POA: Insufficient documentation

## 2022-05-20 LAB — URINALYSIS, COMPLETE (UACMP) WITH MICROSCOPIC
Bilirubin Urine: NEGATIVE
Glucose, UA: NEGATIVE mg/dL
Ketones, ur: NEGATIVE mg/dL
Leukocytes,Ua: NEGATIVE
Nitrite: NEGATIVE
Protein, ur: 100 mg/dL — AB
Specific Gravity, Urine: 1.02 (ref 1.005–1.030)
WBC, UA: NONE SEEN WBC/hpf (ref 0–5)
pH: 5.5 (ref 5.0–8.0)

## 2022-05-20 LAB — PSA: Prostatic Specific Antigen: 0.01 ng/mL (ref 0.00–4.00)

## 2022-05-22 ENCOUNTER — Other Ambulatory Visit: Payer: Self-pay | Admitting: Urology

## 2022-06-05 ENCOUNTER — Ambulatory Visit: Payer: Medicare Other | Admitting: Urology

## 2022-06-05 VITALS — BP 179/78 | HR 62 | Ht 71.5 in | Wt 214.0 lb

## 2022-06-05 DIAGNOSIS — Z8551 Personal history of malignant neoplasm of bladder: Secondary | ICD-10-CM | POA: Diagnosis not present

## 2022-06-05 DIAGNOSIS — Z8546 Personal history of malignant neoplasm of prostate: Secondary | ICD-10-CM

## 2022-06-05 DIAGNOSIS — C61 Malignant neoplasm of prostate: Secondary | ICD-10-CM

## 2022-06-05 NOTE — Progress Notes (Signed)
   06/05/22  CC:  Chief Complaint  Patient presents with   Cysto    HPI:  Elijah Oliver is a 81 y.o. M with multiple GU issues who returns for routine annual follow-up for cysto.  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   History of prostate cancer Personal history of prostate cancer Prostatectomy 2004, open radical.  No recurrence.  PSA 0.01 in 04/2022.     SUI AUS placed 2008, revised 08 to double cuff.   Cuff currently permanently deactivated given his inability to use his hands and cycle cuff More active he is the more he leaks - stable   Urinary urgency Stable on myrbetriq 50 mg  Tried interval off of medication which was worse Oxybutynin not as good as mybetriq 50 mg but back on oxybutynin 5 mg 3 times a day for cost effectiveness   History of kidney stones ESWL x 3 (2013, 2013, 2015) No recent flank pain.  No stones on CT from 11/2016 No interval imaging No flank pain this year   History of bladder cancer TgTa TCC 01/2012 incidental at the time of stone surgery Continues annual surviellence   CKD Baseline Cr 1.5, most recent Cr 1.4 3/7    Erectile dysfuction Baseline severe ED    There were no vitals taken for this visit. NED. A&Ox3.   No respiratory distress   Abd soft, NT, ND Normal phallus with bilateral descended testicles  Cystoscopy Procedure Note  Patient identification was confirmed, informed consent was obtained, and patient was prepped using Betadine solution.  Lidocaine jelly was administered per urethral meatus.     Pre-Procedure: - Inspection reveals a normal caliber ureteral meatus.  Procedure: The flexible cystoscope was introduced without difficulty - No urethral strictures/lesions are present. - Surgically absent prostate with deactivated sphincter  - Tight bladder neck - Bilateral ureteral orifices identified - Bladder mucosa  reveals no ulcers,  tumors, or lesions - No bladder stones - No trabeculation  Retroflexion unremarkable  Post-Procedure: - Patient tolerated the procedure well  Assessment/ Plan:    1. History of bladder cancer Cystoscopy today NED Shared decision making to continue annual surveillance cystoscopy   2. History of prostate cancer PSA up to date  Will recheck next year if not done by PCP prior to visit   3. Urge incontinence of urine Continue oxybutynin 5 mg tid   4. Stress incontinence of urine Tandem cath in place; unable to cycle to secondary to hand neuropathy thus leaves it permanently deactivated   F/u cysto/ PSA/ UA  Hollice Espy, MD

## 2022-08-13 ENCOUNTER — Ambulatory Visit (INDEPENDENT_AMBULATORY_CARE_PROVIDER_SITE_OTHER): Payer: Medicare Other | Admitting: Podiatry

## 2022-08-13 ENCOUNTER — Encounter: Payer: Self-pay | Admitting: Podiatry

## 2022-08-13 DIAGNOSIS — E1142 Type 2 diabetes mellitus with diabetic polyneuropathy: Secondary | ICD-10-CM

## 2022-08-13 DIAGNOSIS — N183 Chronic kidney disease, stage 3 unspecified: Secondary | ICD-10-CM | POA: Diagnosis not present

## 2022-08-13 DIAGNOSIS — M79674 Pain in right toe(s): Secondary | ICD-10-CM

## 2022-08-13 DIAGNOSIS — M79675 Pain in left toe(s): Secondary | ICD-10-CM

## 2022-08-13 DIAGNOSIS — B351 Tinea unguium: Secondary | ICD-10-CM | POA: Diagnosis not present

## 2022-08-13 DIAGNOSIS — M2030 Hallux varus (acquired), unspecified foot: Secondary | ICD-10-CM

## 2022-08-13 NOTE — Progress Notes (Addendum)
This patient returns to my office for at risk foot care.  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  This patient is unable to cut nails himself since the patient cannot reach his nails.These nails are painful walking and wearing shoes.  This patient presents for at risk foot care today.  General Appearance  Alert, conversant and in no acute stress.  Vascular  Dorsalis pedis and posterior tibial  pulses are palpable  bilaterally.  Capillary return is within normal limits  bilaterally. Temperature is within normal limits  bilaterally.  Neurologic  Senn-Weinstein monofilament wire test diminished  bilaterally. Muscle power within normal limits bilaterally.  Nails Thick disfigured discolored nails with subungual debris  from hallux to fifth toes bilaterally. No evidence of bacterial infection or drainage bilaterally.  Orthopedic  No limitations of motion  feet .  No crepitus or effusions noted.  No bony pathology or digital deformities noted.  Hallux malleus  Hammer toes  B/L.  Skin  normotropic skin with no porokeratosis noted bilaterally.  No signs of infections or ulcers noted.   Red areas under bony exostosis right foot.  Onychomycosis  Pain in right toes  Pain in left toes  Consent was obtained for treatment procedures.   Mechanical debridement of nails 1-5  bilaterally performed with a nail nipper.  Filed with dremel without incident.    Return office visit   3 months                   Told patient to return for periodic foot care and evaluation due to potential at risk complications.   Gardiner Barefoot DPM

## 2022-11-12 ENCOUNTER — Ambulatory Visit: Payer: Medicare Other | Admitting: Podiatry

## 2022-11-19 ENCOUNTER — Ambulatory Visit: Payer: Medicare Other | Admitting: Podiatry

## 2022-11-19 DIAGNOSIS — M79675 Pain in left toe(s): Secondary | ICD-10-CM | POA: Diagnosis not present

## 2022-11-19 DIAGNOSIS — M79674 Pain in right toe(s): Secondary | ICD-10-CM | POA: Diagnosis not present

## 2022-11-19 DIAGNOSIS — B351 Tinea unguium: Secondary | ICD-10-CM | POA: Diagnosis not present

## 2022-11-19 DIAGNOSIS — E1142 Type 2 diabetes mellitus with diabetic polyneuropathy: Secondary | ICD-10-CM | POA: Diagnosis not present

## 2022-11-26 NOTE — Progress Notes (Signed)
This patient returns to my office for at risk foot care.  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  This patient is unable to cut nails himself since the patient cannot reach his nails.These nails are painful walking and wearing shoes.  This patient presents for at risk foot care today.  General Appearance  Alert, conversant and in no acute stress.  Vascular  Dorsalis pedis and posterior tibial  pulses are palpable  bilaterally.  Capillary return is within normal limits  bilaterally. Temperature is within normal limits  bilaterally.  Neurologic  Senn-Weinstein monofilament wire test diminished  bilaterally. Muscle power within normal limits bilaterally.  Nails Thick disfigured discolored nails with subungual debris  from hallux to fifth toes bilaterally. No evidence of bacterial infection or drainage bilaterally.  Orthopedic  No limitations of motion  feet .  No crepitus or effusions noted.  No bony pathology or digital deformities noted.  Hallux malleus  Hammer toes  B/L.  Skin  normotropic skin with no porokeratosis noted bilaterally.  No signs of infections or ulcers noted.   Red areas under bony exostosis right foot.  Onychomycosis  Pain in right toes  Pain in left toes  Consent was obtained for treatment procedures.   Mechanical debridement of nails 1-5  bilaterally performed with a nail nipper.  Filed with dremel without incident.    Return office visit   3 months                   Told patient to return for periodic foot care and evaluation due to potential at risk complications.   Boneta Lucks D.P.M.

## 2022-12-18 DIAGNOSIS — M19041 Primary osteoarthritis, right hand: Secondary | ICD-10-CM | POA: Insufficient documentation

## 2023-02-02 DIAGNOSIS — D631 Anemia in chronic kidney disease: Secondary | ICD-10-CM | POA: Insufficient documentation

## 2023-02-02 DIAGNOSIS — R809 Proteinuria, unspecified: Secondary | ICD-10-CM | POA: Insufficient documentation

## 2023-02-02 DIAGNOSIS — M159 Polyosteoarthritis, unspecified: Secondary | ICD-10-CM | POA: Insufficient documentation

## 2023-02-02 DIAGNOSIS — R8290 Unspecified abnormal findings in urine: Secondary | ICD-10-CM | POA: Insufficient documentation

## 2023-02-02 DIAGNOSIS — K219 Gastro-esophageal reflux disease without esophagitis: Secondary | ICD-10-CM | POA: Insufficient documentation

## 2023-02-02 DIAGNOSIS — R32 Unspecified urinary incontinence: Secondary | ICD-10-CM | POA: Insufficient documentation

## 2023-02-04 ENCOUNTER — Other Ambulatory Visit: Payer: Self-pay | Admitting: Nephrology

## 2023-02-04 DIAGNOSIS — R8290 Unspecified abnormal findings in urine: Secondary | ICD-10-CM

## 2023-02-04 DIAGNOSIS — E1122 Type 2 diabetes mellitus with diabetic chronic kidney disease: Secondary | ICD-10-CM

## 2023-02-04 DIAGNOSIS — D631 Anemia in chronic kidney disease: Secondary | ICD-10-CM

## 2023-02-04 DIAGNOSIS — R809 Proteinuria, unspecified: Secondary | ICD-10-CM

## 2023-02-04 DIAGNOSIS — N200 Calculus of kidney: Secondary | ICD-10-CM

## 2023-02-15 ENCOUNTER — Ambulatory Visit
Admission: RE | Admit: 2023-02-15 | Discharge: 2023-02-15 | Disposition: A | Discharge status: Home / Self Care | Payer: Medicare Other | Source: Ambulatory Visit | Attending: Nephrology | Admitting: Nephrology

## 2023-02-15 DIAGNOSIS — N200 Calculus of kidney: Secondary | ICD-10-CM | POA: Diagnosis present

## 2023-02-15 DIAGNOSIS — D631 Anemia in chronic kidney disease: Secondary | ICD-10-CM | POA: Diagnosis present

## 2023-02-15 DIAGNOSIS — N189 Chronic kidney disease, unspecified: Secondary | ICD-10-CM | POA: Diagnosis present

## 2023-02-15 DIAGNOSIS — R8290 Unspecified abnormal findings in urine: Secondary | ICD-10-CM | POA: Insufficient documentation

## 2023-02-15 DIAGNOSIS — E1122 Type 2 diabetes mellitus with diabetic chronic kidney disease: Secondary | ICD-10-CM | POA: Diagnosis present

## 2023-02-15 DIAGNOSIS — R809 Proteinuria, unspecified: Secondary | ICD-10-CM | POA: Insufficient documentation

## 2023-02-25 ENCOUNTER — Ambulatory Visit: Payer: Medicare Other | Admitting: Podiatry

## 2023-02-25 ENCOUNTER — Encounter: Payer: Self-pay | Admitting: Podiatry

## 2023-02-25 DIAGNOSIS — B351 Tinea unguium: Secondary | ICD-10-CM

## 2023-02-25 DIAGNOSIS — E1142 Type 2 diabetes mellitus with diabetic polyneuropathy: Secondary | ICD-10-CM | POA: Diagnosis not present

## 2023-02-25 DIAGNOSIS — M79674 Pain in right toe(s): Secondary | ICD-10-CM | POA: Diagnosis not present

## 2023-02-25 DIAGNOSIS — M79675 Pain in left toe(s): Secondary | ICD-10-CM

## 2023-02-25 DIAGNOSIS — M2041 Other hammer toe(s) (acquired), right foot: Secondary | ICD-10-CM

## 2023-02-25 DIAGNOSIS — M2030 Hallux varus (acquired), unspecified foot: Secondary | ICD-10-CM

## 2023-02-25 DIAGNOSIS — M2042 Other hammer toe(s) (acquired), left foot: Secondary | ICD-10-CM

## 2023-02-25 NOTE — Progress Notes (Signed)
This patient returns to my office for at risk foot care.  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  This patient is unable to cut nails himself since the patient cannot reach his nails.These nails are painful walking and wearing shoes.  This patient presents for at risk foot care today.  General Appearance  Alert, conversant and in no acute stress.  Vascular  Dorsalis pedis and posterior tibial  pulses are palpable  bilaterally.  Capillary return is within normal limits  bilaterally. Temperature is within normal limits  bilaterally.  Neurologic  Senn-Weinstein monofilament wire test diminished  bilaterally. Muscle power within normal limits bilaterally.  Nails Thick disfigured discolored nails with subungual debris  from hallux to fifth toes bilaterally. No evidence of bacterial infection or drainage bilaterally.  Orthopedic  No limitations of motion  feet .  No crepitus or effusions noted.  No bony pathology or digital deformities noted.  Hallux malleus  Hammer toes  B/L.  Skin  normotropic skin with no porokeratosis noted bilaterally.  No signs of infections or ulcers noted.   Red areas under bony exostosis right foot.  Onychomycosis  Pain in right toes  Pain in left toes  Consent was obtained for treatment procedures.   Mechanical debridement of nails 1-5  bilaterally performed with a nail nipper.  Filed with dremel without incident.    Return office visit   3 months                   Told patient to return for periodic foot care and evaluation due to potential at risk complications.   Vinal Rosengrant DPM  

## 2023-03-04 ENCOUNTER — Ambulatory Visit (INDEPENDENT_AMBULATORY_CARE_PROVIDER_SITE_OTHER): Payer: Medicare Other | Admitting: *Deleted

## 2023-03-04 DIAGNOSIS — M2042 Other hammer toe(s) (acquired), left foot: Secondary | ICD-10-CM

## 2023-03-04 DIAGNOSIS — E1142 Type 2 diabetes mellitus with diabetic polyneuropathy: Secondary | ICD-10-CM

## 2023-03-04 DIAGNOSIS — M2041 Other hammer toe(s) (acquired), right foot: Secondary | ICD-10-CM

## 2023-03-04 NOTE — Progress Notes (Signed)
Patient presents to the office today for diabetic shoe and insole measuring.  Patient was measured with brannock device to determine size and width for 1 pair of extra depth shoes and foam casted for 3 pair of insoles.   ABN signed.   Documentation of medical necessity will be sent to patient's treating diabetic doctor to verify and sign.   Patient's diabetic provider: Dion Body, MD   Shoes and insoles will be ordered at that time and patient will be notified for an appointment for fitting when they arrive.   Brannock measurement: 53M  Patient shoe selection-   1st   Shoe choice:   [place] size ordered: 53M

## 2023-05-13 ENCOUNTER — Other Ambulatory Visit
Admission: RE | Admit: 2023-05-13 | Discharge: 2023-05-13 | Disposition: A | Discharge status: Home / Self Care | Payer: Medicare Other | Attending: Urology | Admitting: Urology

## 2023-05-13 DIAGNOSIS — Z8551 Personal history of malignant neoplasm of bladder: Secondary | ICD-10-CM | POA: Diagnosis present

## 2023-05-13 DIAGNOSIS — C61 Malignant neoplasm of prostate: Secondary | ICD-10-CM | POA: Insufficient documentation

## 2023-05-13 LAB — PSA: Prostatic Specific Antigen: <0.01 ng/mL (ref 0.00–4.00)

## 2023-05-13 LAB — URINALYSIS, COMPLETE (UACMP) WITH MICROSCOPIC
Bacteria, UA: NONE SEEN
Bilirubin Urine: NEGATIVE
Glucose, UA: NEGATIVE mg/dL
Ketones, ur: NEGATIVE mg/dL
Leukocytes,Ua: NEGATIVE
Nitrite: NEGATIVE
Protein, ur: 100 mg/dL — AB
Specific Gravity, Urine: 1.025 (ref 1.005–1.030)
WBC, UA: NONE SEEN WBC/hpf (ref 0–5)
pH: 7 (ref 5.0–8.0)

## 2023-05-14 ENCOUNTER — Ambulatory Visit: Payer: Medicare Other | Admitting: Urology

## 2023-05-14 ENCOUNTER — Other Ambulatory Visit: Payer: Self-pay

## 2023-05-14 VITALS — BP 133/65 | HR 69 | Ht 71.5 in | Wt 214.0 lb

## 2023-05-14 DIAGNOSIS — C61 Malignant neoplasm of prostate: Secondary | ICD-10-CM

## 2023-05-14 DIAGNOSIS — Z8546 Personal history of malignant neoplasm of prostate: Secondary | ICD-10-CM

## 2023-05-14 DIAGNOSIS — Z8551 Personal history of malignant neoplasm of bladder: Secondary | ICD-10-CM

## 2023-05-14 DIAGNOSIS — N3946 Mixed incontinence: Secondary | ICD-10-CM

## 2023-05-14 DIAGNOSIS — N3941 Urge incontinence: Secondary | ICD-10-CM

## 2023-05-14 NOTE — Progress Notes (Signed)
   05/14/23   HPI:  Elijah Oliver is a 82 y.o. M with multiple GU issues who returns for routine annual follow-up for cysto.  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   History of prostate cancer Personal history of prostate cancer Prostatectomy 2004, open radical.  No recurrence.  PSA < 0.01 as of yesterday   SUI AUS placed 2008, revised 08 to double cuff.   Cuff currently permanently deactivated given his inability to use his hands and cycle cuff More active he is the more he leaks - stable   Urinary urgency Stable on myrbetriq 50 mg  Tried interval off of medication which was worse Oxybutynin not as good as mybetriq 50 mg but back on oxybutynin 5 mg 3 times a day for cost effectiveness   History of kidney stones ESWL x 3 (2013, 2013, 2015) No recent flank pain.  No stones on CT from 11/2016 No interval imaging No flank pain this year- RUS 01/2023 wnl   History of bladder cancer TgTa TCC 01/2012 incidental at the time of stone surgery Continues annual surviellence   CKD Baseline Cr 1.5, most recent Cr 1.4 5/24    Erectile dysfuction Baseline severe ED    There were no vitals taken for this visit. NED. A&Ox3.   No respiratory distress   Abd soft, NT, ND Normal phallus with bilateral descended testicles  Cystoscopy Procedure Note  Patient identification was confirmed, informed consent was obtained, and patient was prepped using Betadine solution.  Lidocaine jelly was administered per urethral meatus.     Pre-Procedure: - Inspection reveals a normal caliber ureteral meatus.  Procedure: The flexible cystoscope was introduced without difficulty - No urethral strictures/lesions are present. - Surgically absent prostate with deactivated sphincter  - Open bladder neck - Bilateral ureteral orifices identified - Bladder mucosa  reveals no ulcers, tumors, or lesions - No bladder stones -  No trabeculation  Retroflexion unremarkable  Post-Procedure: - Patient tolerated the procedure well  Assessment/ Plan:    1. History of bladder cancer Cystoscopy today NED Shared decision making to continue annual surveillance cystoscopy   2. History of prostate cancer PSA up to date  Will recheck next year if not done by PCP prior to visit   3. Urge incontinence of urine Continue oxybutynin 5 mg tid   4. Stress incontinence of urine Tandem cath in place; unable to cycle to secondary to hand neuropathy thus leaves it permanently deactivated   F/u cysto/ PSA/ UA  Vanna Scotland, MD

## 2023-05-28 ENCOUNTER — Ambulatory Visit: Payer: Medicare Other | Admitting: Podiatry

## 2023-05-28 ENCOUNTER — Encounter: Payer: Self-pay | Admitting: Podiatry

## 2023-05-28 VITALS — BP 147/63 | HR 59

## 2023-05-28 DIAGNOSIS — B351 Tinea unguium: Secondary | ICD-10-CM | POA: Diagnosis not present

## 2023-05-28 DIAGNOSIS — M2041 Other hammer toe(s) (acquired), right foot: Secondary | ICD-10-CM

## 2023-05-28 DIAGNOSIS — M2042 Other hammer toe(s) (acquired), left foot: Secondary | ICD-10-CM

## 2023-05-28 DIAGNOSIS — E1142 Type 2 diabetes mellitus with diabetic polyneuropathy: Secondary | ICD-10-CM | POA: Diagnosis not present

## 2023-05-28 DIAGNOSIS — M79674 Pain in right toe(s): Secondary | ICD-10-CM | POA: Diagnosis not present

## 2023-05-28 DIAGNOSIS — M2030 Hallux varus (acquired), unspecified foot: Secondary | ICD-10-CM

## 2023-05-28 DIAGNOSIS — M79675 Pain in left toe(s): Secondary | ICD-10-CM

## 2023-05-28 NOTE — Progress Notes (Signed)
  Subjective:  Patient ID: Elijah Oliver, male    DOB: [birth year],  MRN: [id number]  Elijah Oliver presents to clinic today for: {jgcomplaint:23593}  Chief Complaint  Patient presents with   Diabetes    "Cut my toenails and get my shoes."  Dr. Burnadette Pop - 04/13/2023, A1c - 6.8     PCP is Marisue Ivan, MD.  Allergies  Allergen Reactions   Other Anaphylaxis    Oriental food- anaphylaxis    Review of Systems: Negative except as noted in the HPI.  Objective: No changes noted in today's physical examination. Vitals:   05/28/23 1153  BP: (!) 147/63  Pulse: (!) 59    Viraaj K Soliz is a pleasant 82 y.o. male in NAD. AAO x 3.  Vascular Examination: Capillary refill time <3 seconds b/l LE. Palpable pedal pulses b/l LE. Digital hair present b/l. No pedal edema b/l. Skin temperature gradient WNL b/l. No varicosities b/l. {jgvascular:23595}.  Dermatological Examination: Pedal skin with normal turgor, texture and tone b/l. No open wounds. No interdigital macerations b/l. Toenails 1-5 b/l thickened, discolored, dystrophic with subungual debris. There is pain on palpation to dorsal aspect of nailplates. {jgderm:23598}.  Neurological Examination: Protective sensation intact with 10 gram monofilament b/l LE. Vibratory sensation intact b/l LE. {jgneuro:23601::"Protective sensation intact 5/5 intact bilaterally with 10g monofilament b/l.","Vibratory sensation intact b/l.","Proprioception intact bilaterally."}  Musculoskeletal Examination: {jgmsk:23600}  Assessment/Plan: 1. Pain due to onychomycosis of toenails of both feet   2. Hallux malleus, unspecified laterality   3. Hammer toes of both feet   4. Diabetic polyneuropathy associated with type 2 diabetes mellitus (HCC)    -Patient was evaluated and treated. All patient's and/or POA's questions/concerns answered on today's visit. -Diabetic foot examination performed today. -Dispensed one pair diabetic shoes and 3 pair total  contact insoles. Shoes were appropriate fit with no heel slippage. Reviewed warranty information and patient signed all paperwork stating patient received shoes, insert(s)/filler(s), break-in instructions and warranty information. Patient instructed not to wear shoes outside unless completely satisfied. Patient related understanding.  -Continue diabetic shoes daily. -Mycotic toenails 1-5 bilaterally were debrided in length and girth with sterile nail nippers and dremel without incident. -Patient/POA to call should there be question/concern in the interim.   Return in about 3 months (around 08/28/2023).  Freddie Breech, DPM

## 2023-09-06 ENCOUNTER — Ambulatory Visit: Payer: Medicare Other | Admitting: Podiatry

## 2023-09-06 ENCOUNTER — Encounter: Payer: Self-pay | Admitting: Podiatry

## 2023-09-06 DIAGNOSIS — M79675 Pain in left toe(s): Secondary | ICD-10-CM

## 2023-09-06 DIAGNOSIS — M79674 Pain in right toe(s): Secondary | ICD-10-CM | POA: Diagnosis not present

## 2023-09-06 DIAGNOSIS — L84 Corns and callosities: Secondary | ICD-10-CM

## 2023-09-06 DIAGNOSIS — E1142 Type 2 diabetes mellitus with diabetic polyneuropathy: Secondary | ICD-10-CM | POA: Diagnosis not present

## 2023-09-06 DIAGNOSIS — B351 Tinea unguium: Secondary | ICD-10-CM | POA: Diagnosis not present

## 2023-09-11 NOTE — Progress Notes (Signed)
Subjective:  Patient ID: Elijah Oliver, male    DOB: [birth year],  MRN: [id number]  82 y.o. male presents at risk foot care with history of diabetic neuropathy and painful thick toenails that are difficult to trim. Pain interferes with ambulation. Aggravating factors include wearing enclosed shoe gear. Pain is relieved with periodic professional debridement.  New problem(s): None   PCP is Elijah Ivan, MD , and last visit was Apr 13, 2023.  Allergies  Allergen Reactions   Other Anaphylaxis    Oriental food- anaphylaxis    Review of Systems: Negative except as noted in the HPI.   Objective:  Elijah Oliver is a pleasant 82 y.o. male obese in NAD.Marland Kitchen AAO x 3.  Vascular Examination: Vascular status intact b/l with palpable pedal pulses. CFT <3 seconds.  Pedal hair diminished. No edema. No pain with calf compression b/l. Skin temperature gradient WNL b/l. No varicosities noted. No cyanosis or clubbing noted.  Neurological Examination: Protective sensation diminished with 10g monofilament b/l.  Dermatological Examination: Pedal skin with normal turgor, texture and tone b/l. No open wounds nor interdigital macerations noted. Toenails 1-5 b/l thick, discolored, elongated with subungual debris and pain on dorsal palpation.   Hyperkeratotic lesion(s) right heel and submet head 1 b/l.  No erythema, no edema, no drainage, no fluctuance.  Musculoskeletal Examination: Muscle strength 5/5 to b/l LE.  No pain, crepitus noted b/l. Hammertoe deformity noted 1-5 b/l.  Assessment:   1. Pain due to onychomycosis of toenails of both feet   2. Callus   3. Diabetic polyneuropathy associated with type 2 diabetes mellitus (HCC)    Plan:  -Consent given for treatment as described below: -Examined patient. -Continue foot and shoe inspections daily. Monitor blood glucose per PCP/Endocrinologist's recommendations. -Continue supportive shoe gear daily. -Toenails 1-5 b/l were debrided in length  and girth with sterile nail nippers and dremel without iatrogenic bleeding.  -Callus(es) right heel and submet head 1 b/l pared utilizing sterile scalpel blade without complication or incident. Total number debrided =3. -Patient/POA to call should there be question/concern in the interim.  Return in about 3 months (around 12/07/2023).  Elijah Oliver, DPM

## 2023-10-22 ENCOUNTER — Encounter: Payer: Medicare Other | Attending: Physician Assistant | Admitting: Physician Assistant

## 2023-10-22 DIAGNOSIS — I2510 Atherosclerotic heart disease of native coronary artery without angina pectoris: Secondary | ICD-10-CM | POA: Diagnosis not present

## 2023-10-22 DIAGNOSIS — G4730 Sleep apnea, unspecified: Secondary | ICD-10-CM | POA: Insufficient documentation

## 2023-10-22 DIAGNOSIS — N186 End stage renal disease: Secondary | ICD-10-CM | POA: Diagnosis not present

## 2023-10-22 DIAGNOSIS — E1143 Type 2 diabetes mellitus with diabetic autonomic (poly)neuropathy: Secondary | ICD-10-CM | POA: Insufficient documentation

## 2023-10-22 DIAGNOSIS — E11621 Type 2 diabetes mellitus with foot ulcer: Secondary | ICD-10-CM | POA: Diagnosis not present

## 2023-10-22 DIAGNOSIS — I120 Hypertensive chronic kidney disease with stage 5 chronic kidney disease or end stage renal disease: Secondary | ICD-10-CM | POA: Insufficient documentation

## 2023-10-22 DIAGNOSIS — E1122 Type 2 diabetes mellitus with diabetic chronic kidney disease: Secondary | ICD-10-CM | POA: Insufficient documentation

## 2023-10-22 DIAGNOSIS — L89622 Pressure ulcer of left heel, stage 2: Secondary | ICD-10-CM | POA: Insufficient documentation

## 2023-10-22 DIAGNOSIS — G40909 Epilepsy, unspecified, not intractable, without status epilepticus: Secondary | ICD-10-CM | POA: Insufficient documentation

## 2023-10-22 DIAGNOSIS — L89612 Pressure ulcer of right heel, stage 2: Secondary | ICD-10-CM | POA: Diagnosis present

## 2023-10-22 NOTE — Progress Notes (Signed)
Elijah Oliver, Elijah Oliver ([id number]) [phone] [id number] Page 1 of 5 Visit Report for 10/22/2023 Abuse Risk Screen Details Patient Name: Date of Service: Elijah Peers RD Oliver. 10/22/2023 10:00 A M Medical Record Number: [id number] Patient Account Number: [id number] Date of Birth/Sex: Treating RN: 12-09-1940 (82 y.o. Elijah Oliver Primary Care Ioan Landini: Marisue Ivan Other Clinician: Referring Judine Arciniega: Treating Oluchi Pucci/Extender: Maxwell Caul Weeks in Treatment: 0 Abuse Risk Screen Items Answer ABUSE RISK SCREEN: Has anyone close to you tried to hurt or harm you recentlyo No Do you feel uncomfortable with anyone in your familyo No Has anyone forced you do things that you didnt want to doo No Electronic Signature(s) Signed: 10/22/2023 12:40:51 PM By: Angelina Pih Entered By: Angelina Pih on 10/22/2023 10:51:54 -------------------------------------------------------------------------------- Activities of Daily Living Details Patient Name: Date of Service: Elijah Peers RD Oliver. 10/22/2023 10:00 A M Medical Record Number: [id number] Patient Account Number: [id number] Date of Birth/Sex: Treating RN: [birth year] (82 y.o. Elijah Oliver Primary Care Sandra Tellefsen: Marisue Ivan Other Clinician: Referring Dodd Schmid: Treating Andrius Andrepont/Extender: Maxwell Caul Weeks in Treatment: 0 Activities of Daily Living Items Answer Activities of Daily Living (Please select one for each item) Drive Automobile Completely Able T Medications ake Completely Able Use T elephone Completely Able Care for Appearance Completely Able Use T oilet Completely Able Bath / Shower Completely Able Dress Self Completely Able Feed Self Completely Able Walk Completely Able Get In / Out Bed Completely Able Housework Completely Able Elijah Oliver, Elijah Oliver ([id number]) [phone] [id number] Page 2 of 5 Prepare Meals Completely  Able Handle Money Completely Able Shop for Self Completely Able Electronic Signature(s) Signed: 10/22/2023 12:40:51 PM By: Angelina Pih Entered By: Angelina Pih on 10/22/2023 10:52:50 -------------------------------------------------------------------------------- Education Screening Details Patient Name: Date of Service: Elijah Peers RD Oliver. 10/22/2023 10:00 A M Medical Record Number: [id number] Patient Account Number: [id number] Date of Birth/Sex: Treating RN: [birth year] (82 y.o. Elijah Oliver Primary Care December Hedtke: Marisue Ivan Other Clinician: Referring Soloman Mckeithan: Treating Byrd Terrero/Extender: Rebecka Apley in Treatment: 0 Learning Preferences/Education Level/Primary Language Learning Preference: Explanation, Demonstration, Video, Communication Board, Printed Material Preferred Language: English Cognitive Barrier Language Barrier: No Translator Needed: No Memory Deficit: No Emotional Barrier: No Cultural/Religious Beliefs Affecting Medical Care: No Physical Barrier Impaired Vision: No Impaired Hearing: No Decreased Hand dexterity: No Knowledge/Comprehension Knowledge Level: High Comprehension Level: High Ability to understand written instructions: High Ability to understand verbal instructions: High Motivation Anxiety Level: Calm Cooperation: Cooperative Education Importance: Acknowledges Need Interest in Health Problems: Asks Questions Perception: Coherent Willingness to Engage in Self-Management High Activities: Readiness to Engage in Self-Management High Activities: Electronic Signature(s) Signed: 10/22/2023 12:40:51 PM By: Angelina Pih Entered By: Angelina Pih on 10/22/2023 10:53:06 Elijah Oliver, Elijah Oliver ([id number]) [phone] [id number] Page 3 of 5 -------------------------------------------------------------------------------- Fall Risk Assessment Details Patient Name: Date of Service: Elijah Peers RD Oliver. 10/22/2023 10:00 A M Medical Record Number: [id number] Patient Account Number: [id number] Date of Birth/Sex: Treating RN: October 25, 1941 (82 y.o. Elijah Oliver Primary Care Tanush Drees: Marisue Ivan Other Clinician: Referring Shar Paez: Treating Razan Siler/Extender: Maxwell Caul Weeks in Treatment: 0 Fall Risk Assessment Items Have you had 2 or more falls in the last 12 monthso 0 No Have you had any fall that resulted in injury in the last 12 monthso 0 No FALLS RISK SCREEN History of falling - immediate or within 3 months 0 No Secondary diagnosis (Do you have 2 or more medical diagnoseso) 0 No Ambulatory aid None/bed  rest/wheelchair/nurse 0 Yes Crutches/cane/walker 0 No Furniture 0 No Intravenous therapy Access/Saline/Heparin Lock 0 No Gait/Transferring Normal/ bed rest/ wheelchair 0 Yes Weak (short steps with or without shuffle, stooped but able to lift head while walking, may seek 0 No support from furniture) Impaired (short steps with shuffle, may have difficulty arising from chair, head down, impaired 0 No balance) Mental Status Oriented to own ability 0 Yes Electronic Signature(s) Signed: 10/22/2023 12:40:51 PM By: Angelina Pih Entered By: Angelina Pih on 10/22/2023 10:53:14 -------------------------------------------------------------------------------- Foot Assessment Details Patient Name: Date of Service: Elijah Peers RD Oliver. 10/22/2023 10:00 A M Medical Record Number: [id number] Patient Account Number: [id number] Date of Birth/Sex: Treating RN: [birth year] (82 y.o. Elijah Oliver Primary Care Devynn Hessler: Marisue Ivan Other Clinician: Referring Equilla Que: Treating Hymen Arnett/Extender: Rebecka Apley in Treatment: 0 Foot Assessment Items Site Locations Elijah Oliver, Elijah Oliver ([id number]) [phone] [id number] Page 4 of 5 + = Sensation present, - = Sensation absent, C = Callus, U =  Ulcer R = Redness, W = Warmth, M = Maceration, PU = Pre-ulcerative lesion F = Fissure, S = Swelling, D = Dryness Assessment Right: Left: Other Deformity: No No Prior Foot Ulcer: No No Prior Amputation: No No Charcot Joint: No No Ambulatory Status: Ambulatory Without Help Gait: Unsteady Electronic Signature(s) Signed: 10/22/2023 12:40:51 PM By: Angelina Pih Entered By: Angelina Pih on 10/22/2023 10:57:33 -------------------------------------------------------------------------------- Nutrition Risk Screening Details Patient Name: Date of Service: Elijah Peers RD Oliver. 10/22/2023 10:00 A M Medical Record Number: [id number] Patient Account Number: [id number] Date of Birth/Sex: Treating RN: [birth year] (82 y.o. Elijah Oliver Primary Care Vitalia Stough: Marisue Ivan Other Clinician: Referring Reakwon Barren: Treating Platon Arocho/Extender: Maxwell Caul Weeks in Treatment: 0 Height (in): 72 Weight (lbs): 214 Body Mass Index (BMI): 29 Nutrition Risk Screening Items Score Screening NUTRITION RISK SCREEN: I have an illness or condition that made me change the kind and/or amount of food I eat 0 No I eat fewer than two meals per day 0 No I eat few fruits and vegetables, or milk products 0 No I have three or more drinks of beer, liquor or wine almost every day 0 No I have tooth or mouth problems that make it hard for me to eat 0 No I don't always have enough money to buy the food I need 0 No Elijah Oliver, Elijah Oliver ([id number]) [phone] [id number] Page 5 of 5 I eat alone most of the time 0 No I take three or more different prescribed or over-the-counter drugs a day 0 No Without wanting to, I have lost or gained 10 pounds in the last six months 0 No I am not always physically able to shop, cook and/or feed myself 0 No Nutrition Protocols Good Risk Protocol 0 No interventions needed Moderate Risk Protocol High Risk Proctocol Risk Level: Good  Risk Score: 0 Electronic Signature(s) Signed: 10/22/2023 12:40:51 PM By: Angelina Pih Entered By: Angelina Pih on 10/22/2023 10:53:20

## 2023-10-22 NOTE — Progress Notes (Signed)
[name], [name] ([id number]) [id number] Page 1 of 10 Visit Report for 10/22/2023 Allergy List Details Patient Name: Date of Service: Elijah Oliver RD K. 10/22/2023 10:00 A M Medical Record Number: [id number] Patient Account Number: [id number] Date of Birth/Sex: Treating RN: Aug 21, 1941 (82 y.o. Elijah Oliver Primary Care Ethelreda Sukhu: Marisue Ivan Other Clinician: Referring Vester Balthazor: Treating Delonte Musich/Extender: Maxwell Caul Weeks in Treatment: 0 Allergies Active Allergies food extracts Reaction: oriental food Severity: Severe Allergy Notes Electronic Signature(s) Signed: 10/22/2023 12:20:54 PM By: Angelina Pih Entered By: Angelina Pih on 10/22/2023 12:20:54 -------------------------------------------------------------------------------- Arrival Information Details Patient Name: Date of Service: Elijah Oliver RD K. 10/22/2023 10:00 A M Medical Record Number: [id number] Patient Account Number: [id number] Date of Birth/Sex: Treating RN: 1941-03-03 (82 y.o. Elijah Oliver Primary Care Tayonna Bacha: Marisue Ivan Other Clinician: Referring Chontel Warning: Treating Maggie Senseney/Extender: Rebecka Apley in Treatment: 0 Visit Information Patient Arrived: Ambulatory Arrival Time: 10:37 Accompanied By: self Transfer Assistance: None Patient Identification Verified: Yes Secondary Verification Process Completed: Yes Patient Has Alerts: Yes Patient Alerts: Patient on Blood Thinner type 2 diabetic Electronic Signature(s) SIGFRED, GUERNSEY ([id number]) [phone].pdf Page 2 of 10 Signed: 10/22/2023 12:20:00 PM By: Angelina Pih Entered By: Angelina Pih on 10/22/2023 12:20:00 -------------------------------------------------------------------------------- Clinic Level of Care Assessment Details Patient Name: Date of Service: Elijah Oliver RD K. 10/22/2023 10:00 A M Medical Record  Number: [id number] Patient Account Number: [id number] Date of Birth/Sex: Treating RN: 02-14-1941 (82 y.o. Elijah Oliver Primary Care Chelcie Estorga: Marisue Ivan Other Clinician: Referring Shenoa Hattabaugh: Treating Donna Silverman/Extender: Maxwell Caul Weeks in Treatment: 0 Clinic Level of Care Assessment Items TOOL 1 Quantity Score []  - 0 Use when EandM and Procedure is performed on INITIAL visit ASSESSMENTS - Nursing Assessment / Reassessment X- 1 20 General Physical Exam (combine w/ comprehensive assessment (listed just below) when performed on new pt. evals) X- 1 25 Comprehensive Assessment (HX, ROS, Risk Assessments, Wounds Hx, etc.) ASSESSMENTS - Wound and Skin Assessment / Reassessment []  - 0 Dermatologic / Skin Assessment (not related to wound area) ASSESSMENTS - Ostomy and/or Continence Assessment and Care []  - 0 Incontinence Assessment and Management []  - 0 Ostomy Care Assessment and Management (repouching, etc.) PROCESS - Coordination of Care X - Simple Patient / Family Education for ongoing care 1 15 []  - 0 Complex (extensive) Patient / Family Education for ongoing care X- 1 10 Staff obtains Chiropractor, Records, T Results / Process Orders est []  - 0 Staff telephones HHA, Nursing Homes / Clarify orders / etc []  - 0 Routine Transfer to another Facility (non-emergent condition) []  - 0 Routine Hospital Admission (non-emergent condition) X- 1 15 New Admissions / Manufacturing engineer / Ordering NPWT Apligraf, etc. , []  - 0 Emergency Hospital Admission (emergent condition) PROCESS - Special Needs []  - 0 Pediatric / Minor Patient Management []  - 0 Isolation Patient Management []  - 0 Hearing / Language / Visual special needs []  - 0 Assessment of Community assistance (transportation, D/C planning, etc.) []  - 0 Additional assistance / Altered mentation []  - 0 Support Surface(s) Assessment (bed, cushion, seat, etc.) INTERVENTIONS - Miscellaneous []  -  0 External ear exam []  - 0 Patient Transfer (multiple staff / Nurse, adult / Similar devices) []  - 0 Simple Staple / Suture removal (25 or less) NAZIRE, CHISMAN ([id number]) [id number] Page 3 of 10 []  - 0 Complex Staple / Suture removal (26 or more) []  - 0 Hypo/Hyperglycemic Management (do not check if billed separately) X- 1  15 Ankle / Brachial Index (ABI) - do not check if billed separately Has the patient been seen at the hospital within the last three years: Yes Total Score: 100 Level Of Care: New/Established - Level 3 Electronic Signature(s) Signed: 10/22/2023 12:40:51 PM By: Angelina Pih Entered By: Angelina Pih on 10/22/2023 12:22:46 -------------------------------------------------------------------------------- Encounter Discharge Information Details Patient Name: Date of Service: Elijah Oliver RD K. 10/22/2023 10:00 A M Medical Record Number: [id number] Patient Account Number: [id number] Date of Birth/Sex: Treating RN: 04-30-1941 (82 y.o. Elijah Oliver Primary Care Lorriann Hansmann: Marisue Ivan Other Clinician: Referring Demetrie Borge: Treating Dyna Figuereo/Extender: Rebecka Apley in Treatment: 0 Encounter Discharge Information Items Post Procedure Vitals Discharge Condition: Stable Temperature (F): 98 Ambulatory Status: Ambulatory Pulse (bpm): 69 Discharge Destination: Home Respiratory Rate (breaths/min): 18 Transportation: Private Auto Blood Pressure (mmHg): 118/69 Accompanied By: self Schedule Follow-up Appointment: Yes Clinical Summary of Care: Electronic Signature(s) Signed: 10/22/2023 12:26:38 PM By: Angelina Pih Entered By: Angelina Pih on 10/22/2023 12:26:38 -------------------------------------------------------------------------------- Lower Extremity Assessment Details Patient Name: Date of Service: Elijah Oliver RD K. 10/22/2023 10:00 A M Medical Record Number: [id number] Patient Account  Number: [id number] Date of Birth/Sex: Treating RN: 07/17/1941 (82 y.o. Elijah Oliver Primary Care Fionna Merriott: Marisue Ivan Other Clinician: Referring Diesel Lina: Treating Alessandria Henken/Extender: Rebecka Apley in Treatment: 0 Edema Assessment T[Left: AHMANI, ALVARADO ([id number])] Franne Forts: [id number] Page 4 of 10] Assessed: [Left: No] [Right: No] Edema: [Left: No] [Right: Yes] Calf Left: Right: Point of Measurement: 36 cm From Medial Instep 34.5 cm 33 cm Ankle Left: Right: Point of Measurement: 10 cm From Medial Instep 20.5 cm 21.5 cm Knee To Floor Left: Right: From Medial Instep 45 cm 45 cm Vascular Assessment Pulses: Dorsalis Pedis Doppler Audible: [Left:Yes] [Right:Yes] Posterior Tibial Doppler Audible: [Left:Yes] [Right:Yes] Extremity colors, hair growth, and conditions: Extremity Color: [Left:Normal] [Right:Normal] Hair Growth on Extremity: [Left:No] [Right:No] Temperature of Extremity: [Left:Warm] [Right:Warm] Capillary Refill: [Left:< 3 seconds] [Right:< 3 seconds] Blood Pressure: Brachial: [Left:118] [Right:118] Ankle: [Left:Dorsalis Pedis: 110 0.93] [Right:Dorsalis Pedis: 110 0.93] Toe Nail Assessment Left: Right: Thick: No No Discolored: No No Deformed: No No Improper Length and Hygiene: No No Electronic Signature(s) Signed: 10/22/2023 12:40:51 PM By: Angelina Pih Entered By: Angelina Pih on 10/22/2023 11:07:37 -------------------------------------------------------------------------------- Multi Wound Chart Details Patient Name: Date of Service: Elijah Oliver RD K. 10/22/2023 10:00 A M Medical Record Number: [id number] Patient Account Number: [id number] Date of Birth/Sex: Treating RN: 01/27/1941 (82 y.o. Elijah Oliver Primary Care Lorren Splawn: Marisue Ivan Other Clinician: Referring Jahmar Mckelvy: Treating Orlandus Borowski/Extender: Maxwell Caul Weeks in Treatment: 0 Vital  Signs Height(in): 72 Pulse(bpm): 69 Weight(lbs): 214 Blood Pressure(mmHg): 118/69 Body Mass Index(BMI): 29 Temperature(F): 98 Respiratory Rate(breaths/min): [address] K ([id number]) [phone].pdf Page 5 of 10 [1:Photos:] [N/A:N/A] Right, Medial Calcaneus Left Calcaneus N/A Wound Location: Blister Blister N/A Wounding Event: Diabetic Wound/Ulcer of the Lower Diabetic Wound/Ulcer of the Lower N/A Primary Etiology: Extremity Extremity Glaucoma, Sleep Apnea, Coronary Glaucoma, Sleep Apnea, Coronary N/A Comorbid History: Artery Disease, Hypertension, Type II Artery Disease, Hypertension, Type II Diabetes, End Stage Renal Disease, Diabetes, End Stage Renal Disease, Neuropathy, Seizure Disorder Neuropathy, Seizure Disorder 10/08/2023 10/08/2023 N/A Date Acquired: 0 0 N/A Weeks of Treatment: Open Open N/A Wound Status: No No N/A Wound Recurrence: 4.5x5x0.1 0.7x0.4x0.1 N/A Measurements L x W x D (cm) 17.671 0.22 N/A A (cm) : rea 1.767 0.022 N/A Volume (cm) : Grade 2 Grade 2 N/A Classification: Medium Medium N/A Exudate A mount:  Serosanguineous Serosanguineous N/A Exudate Type: red, brown red, brown N/A Exudate Color: Small (1-33%) Medium (34-66%) N/A Granulation A mount: Red, Pink Pink, Pale N/A Granulation Quality: Large (67-100%) Medium (34-66%) N/A Necrotic A mount: Fat Layer (Subcutaneous Tissue): Yes N/A N/A Exposed Structures: None None N/A Epithelialization: Debridement - Excisional N/A N/A Debridement: Pre-procedure Verification/Time Out 11:29 N/A N/A Taken: Lidocaine 4% Topical Solution N/A N/A Pain Control: Callus, Subcutaneous, Slough N/A N/A Tissue Debrided: Skin/Subcutaneous Tissue N/A N/A Level: 17.66 N/A N/A Debridement A (sq cm): rea Curette N/A N/A Instrument: Moderate N/A N/A Bleeding: Pressure N/A N/A Hemostasis A chieved: Procedure was tolerated well N/A N/A Debridement Treatment Response: 4.5x5x0.1  N/A N/A Post Debridement Measurements L x W x D (cm) 1.767 N/A N/A Post Debridement Volume: (cm) Debridement N/A N/A Procedures Performed: Treatment Notes Electronic Signature(s) Signed: 10/22/2023 12:40:51 PM By: Angelina Pih Entered By: Angelina Pih on 10/22/2023 11:32:41 -------------------------------------------------------------------------------- Multi-Disciplinary Care Plan Details Patient Name: Date of Service: Elijah Oliver RD K. 10/22/2023 10:00 A M Medical Record Number: [id number] Patient Account Number: [id number] Date of Birth/Sex: Treating RN: 24-Apr-1941 (82 y.o. Elijah Oliver Primary Care Digby Groeneveld: Marisue Ivan Other Clinician: Referring Chea Malan: Treating Loran Auguste/Extender: Saharsh, Cobarrubias ([id number]) [id number] Page 6 of 10 Weeks in Treatment: 0 Active Inactive Necrotic Tissue Nursing Diagnoses: Impaired tissue integrity related to necrotic/devitalized tissue Knowledge deficit related to management of necrotic/devitalized tissue Goals: Necrotic/devitalized tissue will be minimized in the wound bed Date Initiated: 10/22/2023 Target Resolution Date: 12/17/2023 Goal Status: Active Patient/caregiver will verbalize understanding of reason and process for debridement of necrotic tissue Date Initiated: 10/22/2023 Date Inactivated: 10/22/2023 Target Resolution Date: 10/22/2023 Goal Status: Met Interventions: Assess patient pain level pre-, during and post procedure and prior to discharge Provide education on necrotic tissue and debridement process Treatment Activities: Apply topical anesthetic as ordered : 10/22/2023 Excisional debridement : 10/22/2023 Notes: Wound/Skin Impairment Nursing Diagnoses: Impaired tissue integrity Knowledge deficit related to ulceration/compromised skin integrity Goals: Ulcer/skin breakdown will have a volume reduction of 30% by week 4 Date Initiated:  10/22/2023 Target Resolution Date: 11/19/2023 Goal Status: Active Ulcer/skin breakdown will have a volume reduction of 50% by week 8 Date Initiated: 10/22/2023 Target Resolution Date: 12/17/2023 Goal Status: Active Ulcer/skin breakdown will have a volume reduction of 80% by week 12 Date Initiated: 10/22/2023 Target Resolution Date: 01/14/2024 Goal Status: Active Ulcer/skin breakdown will heal within 14 weeks Date Initiated: 10/22/2023 Target Resolution Date: 01/28/2024 Goal Status: Active Interventions: Assess patient/caregiver ability to obtain necessary supplies Assess patient/caregiver ability to perform ulcer/skin care regimen upon admission and as needed Assess ulceration(s) every visit Provide education on ulcer and skin care Notes: Electronic Signature(s) Signed: 10/22/2023 12:24:43 PM By: Angelina Pih Entered By: Angelina Pih on 10/22/2023 12:24:42 Durene Cal ([id number]) [id number] Page 7 of 10 -------------------------------------------------------------------------------- Pain Assessment Details Patient Name: Date of Service: Elijah Oliver RD K. 10/22/2023 10:00 A M Medical Record Number: [id number] Patient Account Number: [id number] Date of Birth/Sex: Treating RN: 07-14-1941 (82 y.o. Elijah Oliver Primary Care Aldean Suddeth: Marisue Ivan Other Clinician: Referring Ayeisha Lindenberger: Treating Gaylin Osoria/Extender: Maxwell Caul Weeks in Treatment: 0 Active Problems Location of Pain Severity and Description of Pain Patient Has Paino No Site Locations Rate the pain. Current Pain Level: 0 Pain Management and Medication Current Pain Management: Electronic Signature(s) Signed: 10/22/2023 12:40:51 PM By: Angelina Pih Entered By: Angelina Pih on 10/22/2023 10:47:02 -------------------------------------------------------------------------------- Patient/Caregiver Education Details Patient Name: Date of  Service: Elijah Oliver RD  K. 11/22/2024andnbsp10:00 A M Medical Record Number: [id number] Patient Account Number: [id number] Date of Birth/Gender: Treating RN: Apr 16, 1941 (82 y.o. Elijah Oliver Primary Care Physician: Marisue Ivan Other Clinician: Referring Physician: Treating Physician/Extender: Rebecka Apley in Treatment: 0 Education Assessment Education Provided To: Patient Education Topics Provided Welcome T [facility]New [facility] Packet: o Handouts: [facility] P[facility] form, Welcome T [facility] o Methods: Explain/Verbal Responses: State content correctly CANNAN, DEMORE ([id number]) [id number] Page 8 of 10 Wound Debridement: Handouts: Wound Debridement Methods: Explain/Verbal Responses: State content correctly Wound/Skin Impairment: Handouts: Caring for Your Ulcer Methods: Explain/Verbal Responses: State content correctly Electronic Signature(s) Signed: 10/22/2023 12:40:51 PM By: Angelina Pih Entered By: Angelina Pih on 10/22/2023 12:25:21 -------------------------------------------------------------------------------- Wound Assessment Details Patient Name: Date of Service: Elijah Oliver RD K. 10/22/2023 10:00 A M Medical Record Number: [id number] Patient Account Number: [id number] Date of Birth/Sex: Treating RN: [birth year] (82 y.o. Elijah Oliver Primary Care Soraiya Ahner: Marisue Ivan Other Clinician: Referring Laylynn Campanella: Treating Osmara Drummonds/Extender: Maxwell Caul Weeks in Treatment: 0 Wound Status Wound Number: 1 Primary Diabetic Wound/Ulcer of the Lower Extremity Etiology: Wound Location: Right, Medial Calcaneus Wound Open Wounding Event: Blister Status: Date Acquired: 10/08/2023 Comorbid Glaucoma, Sleep Apnea, Coronary Artery Disease, Hypertension, Weeks Of Treatment: 0 History: Type II Diabetes, End Stage Renal Disease, Neuropathy,  Seizure Clustered Wound: No Disorder Photos Wound Measurements Length: (cm) 4.5 Width: (cm) 5 Depth: (cm) 0.1 Area: (cm) 17.671 Volume: (cm) 1.767 % Reduction in Area: % Reduction in Volume: Epithelialization: None Tunneling: No Undermining: No Wound Description Classification: Grade 2 Exudate Amount: Medium Exudate Type: Serosanguineous Exudate Color: red, brown GORDY, GNAGEY ([id number]) Wound Bed Granulation Amount: Small (1-33%) Granulation Quality: Red, Pink Necrotic Amount: Large (67-100%) Necrotic Quality: Adherent Slough Foul Odor After Cleansing: No Slough/Fibrino Yes [id number] Page 9 of 10 Exposed Structure Fat Layer (Subcutaneous Tissue) Exposed: Yes Electronic Signature(s) Signed: 10/22/2023 12:40:51 PM By: Angelina Pih Entered By: Angelina Pih on 10/22/2023 11:08:54 -------------------------------------------------------------------------------- Wound Assessment Details Patient Name: Date of Service: Elijah Oliver RD K. 10/22/2023 10:00 A M Medical Record Number: [id number] Patient Account Number: [id number] Date of Birth/Sex: Treating RN: Jan 30, 1941 (82 y.o. Elijah Oliver Primary Care Tashema Tiller: Marisue Ivan Other Clinician: Referring Billyjack Trompeter: Treating Alec Mcphee/Extender: Maxwell Caul Weeks in Treatment: 0 Wound Status Wound Number: 2 Primary Diabetic Wound/Ulcer of the Lower Extremity Etiology: Wound Location: Left Calcaneus Wound Open Wounding Event: Blister Status: Date Acquired: 10/08/2023 Comorbid Glaucoma, Sleep Apnea, Coronary Artery Disease, Hypertension, Weeks Of Treatment: 0 History: Type II Diabetes, End Stage Renal Disease, Neuropathy, Seizure Clustered Wound: No Disorder Photos Wound Measurements Length: (cm) 0.7 Width: (cm) 0.4 Depth: (cm) 0.1 Area: (cm) 0.22 Volume: (cm) 0.022 % Reduction in Area: % Reduction in Volume: Epithelialization: None Tunneling:  No Undermining: No Wound Description Classification: Grade 2 Exudate Amount: Medium Exudate Type: Serosanguineous Exudate Color: red, brown Foul Odor After Cleansing: No Slough/Fibrino Yes Wound Bed Granulation Amount: Medium (34-66%) Granulation Quality: Pink, Pale Necrotic Amount: Medium (34-66%) ESPN, EBERLEIN ([id number]) [id number] Page 10 of 10 Necrotic Quality: Adherent Slough Treatment Notes Wound #2 (Calcaneus) Wound Laterality: Left Cleanser Byram Ancillary Kit - 15 Day Supply Discharge Instruction: Use supplies as instructed; Kit contains: (15) Saline Bullets; (15) 3x3 Gauze; 15 pr Gloves Wound Cleanser Discharge Instruction: Wash your hands with soap and water. Remove old dressing, discard into plastic bag and place into trash. Cleanse the wound with Wound Cleanser prior to applying a  clean dressing using gauze sponges, not tissues or cotton balls. Do not scrub or use excessive force. Pat dry using gauze sponges, not tissue or cotton balls. Peri-Wound Care Topical Primary Dressing Hydrofera Blue Ready Transfer Foam, 2.5x2.5 (in/in) Discharge Instruction: Apply Hydrofera Blue Ready to wound bed as directed Secondary Dressing (BORDER) Zetuvit Plus SILICONE BORDER Dressing 4x4 (in/in) Discharge Instruction: Please do not put silicone bordered dressings under wraps. Use non-bordered dressing only. Secured With Compression Wrap Compression Stockings Add-Ons Electronic Signature(s) Signed: 10/22/2023 12:40:51 PM By: Angelina Pih Entered By: Angelina Pih on 10/22/2023 11:32:09 -------------------------------------------------------------------------------- Vitals Details Patient Name: Date of Service: Elijah Oliver RD K. 10/22/2023 10:00 A M Medical Record Number: [id number] Patient Account Number: [id number] Date of Birth/Sex: Treating RN: 07-29-1941 (82 y.o. Elijah Oliver Primary Care Auguste Tebbetts: Marisue Ivan Other  Clinician: Referring Berlin Mokry: Treating Taeko Schaffer/Extender: Maxwell Caul Weeks in Treatment: 0 Vital Signs Time Taken: 10:45 Temperature (F): 98 Height (in): 72 Pulse (bpm): 69 Source: Stated Respiratory Rate (breaths/min): 18 Weight (lbs): 214 Blood Pressure (mmHg): 118/69 Source: Stated Reference Range: 80 - 120 mg / dl Body Mass Index (BMI): 29 Electronic Signature(s) Signed: 10/22/2023 12:40:51 PM By: Angelina Pih Entered By: Angelina Pih on 10/22/2023 10:47:34

## 2023-10-22 NOTE — Progress Notes (Signed)
GALE, RANDO ([id number]) [id number] Page 1 of 10 Visit Report for 10/22/2023 Chief Complaint Document Details Patient Name: Date of Service: Elijah Oliver RD K. 10/22/2023 10:00 A M Medical Record Number: [id number] Patient Account Number: [id number] Date of Birth/Sex: Treating RN: 01/11/1941 (82 y.o. Elijah Oliver Primary Care Provider: Marisue Oliver Other Clinician: Referring Provider: Treating Provider/Extender: Elijah Oliver Weeks in Treatment: 0 Information Obtained from: Patient Chief Complaint Bilateral Heel Ulcers Electronic Signature(s) Signed: 10/22/2023 11:21:53 AM By: Elijah Derry PA-C Entered By: Elijah Oliver on 10/22/2023 11:21:53 -------------------------------------------------------------------------------- Debridement Details Patient Name: Date of Service: Elijah Oliver RD K. 10/22/2023 10:00 A M Medical Record Number: [id number] Patient Account Number: [id number] Date of Birth/Sex: Treating RN: 1941-02-22 (82 y.o. Elijah Oliver Primary Care Provider: Marisue Oliver Other Clinician: Referring Provider: Treating Provider/Extender: Elijah Oliver Weeks in Treatment: 0 Debridement Performed for Assessment: Wound #1 Right,Medial Calcaneus Performed By: Physician Elijah Derry, PA-C Debridement Type: Debridement Severity of Tissue Pre Debridement: Fat layer exposed Level of Consciousness (Pre-procedure): Awake and Alert Pre-procedure Verification/Time Out Yes - 11:29 Taken: Pain Control: Lidocaine 4% T opical Solution Percent of Wound Bed Debrided: 100% T Area Debrided (cm): otal 17.66 Tissue and other material debrided: Viable, Non-Viable, Callus, Slough, Subcutaneous, Slough Level: Skin/Subcutaneous Tissue Debridement Description: Excisional Instrument: Curette Bleeding: Moderate Hemostasis Achieved: Pressure Response to Treatment: Procedure was tolerated well Level of  Consciousness (Post- Awake and Alert procedure): Elijah Oliver, Elijah Oliver ([id number]) [id number] Page 2 of 10 Post Debridement Measurements of Total Wound Length: (cm) 4.5 Width: (cm) 5 Depth: (cm) 0.1 Volume: (cm) 1.767 Character of Wound/Ulcer Post Debridement: Stable Severity of Tissue Post Debridement: Fat layer exposed Post Procedure Diagnosis Same as Pre-procedure Electronic Signature(s) Signed: 10/22/2023 12:40:51 PM By: Elijah Oliver Signed: 10/22/2023 12:54:47 PM By: Elijah Derry PA-C Entered By: Elijah Oliver on 10/22/2023 11:32:22 -------------------------------------------------------------------------------- HPI Details Patient Name: Date of Service: Elijah Oliver RD K. 10/22/2023 10:00 A M Medical Record Number: [id number] Patient Account Number: [id number] Date of Birth/Sex: Treating RN: July 05, 1941 (82 y.o. Elijah Oliver Primary Care Provider: Marisue Oliver Other Clinician: Referring Provider: Treating Provider/Extender: Elijah Oliver Weeks in Treatment: 0 History of Present Illness Chronic/Inactive Conditions Condition 1: 10-22-2023 patient's ABIs on screening today were 0.93 on the left and the right and appear to be sufficient for wound healing. HPI Description: 10-22-2023 upon evaluation today patient appears to be doing somewhat poorly in regard to wounds on his heels he has 1 on the left heel which is not really doing too badly and 1 on the right heel that is a little bit more significant at this point although there is a lot of callus the wound itself does not appear to be infected and in general I do not think this is too bad overall. The patient tells me he has not really been doing anything in particular from a dressing standpoint he is just been covering it with a sock primarily. Therefore we need to get some things moving as far as his wound is concerned with regard to dressings. I did advise him he  really does not need to see podiatry as we can be taken care of the wounds and get this healed. 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 His most recent hemoglobin A1c was 6.8 and that was actually on 04-06-2023 Electronic Signature(s) Signed: 10/22/2023 12:38:41 PM By: Elijah Derry PA-C Entered By: Elijah Oliver on 10/22/2023  12:38:41 -------------------------------------------------------------------------------- Physical Exam Details Patient Name: Date of Service: Elijah Oliver RD K. 10/22/2023 10:00 A [name], [name] ([id number]) [id number] Page 3 of 10 Medical Record Number: [id number] Patient Account Number: [id number] Date of Birth/Sex: Treating RN: 1941-06-17 (82 y.o. Elijah Oliver Primary Care Provider: Marisue Oliver Other Clinician: Referring Provider: Treating Provider/Extender: Elijah Oliver Weeks in Treatment: 0 Constitutional sitting or standing blood pressure is within target range for patient.. pulse regular and within target range for patient.Marland Kitchen respirations regular, non-labored and within target range for patient.Marland Kitchen temperature within target range for patient.. Obese and well-hydrated in no acute distress. Eyes conjunctiva clear no eyelid edema noted. pupils equal round and reactive to light and accommodation. Ears, Nose, Mouth, and Throat no gross abnormality of ear auricles or external auditory canals. normal hearing noted during conversation. mucus membranes moist. Respiratory normal breathing without difficulty. Cardiovascular 2+ dorsalis pedis/posterior tibialis pulses. no clubbing, cyanosis, significant edema, <3 sec cap refill. Musculoskeletal normal gait and posture. no significant deformity or arthritic changes, no loss or range of motion, no clubbing. Psychiatric this patient is able to make decisions and demonstrates good insight into disease process.  Alert and Oriented x 3. pleasant and cooperative. Notes Upon inspection patient's wound bed actually showed signs of good granulation and epithelization at this point. He actually seems to be doing quite well and in general I think that he is going to heal well with regard to his wounds I am going to perform some debridement today to clearway necrotic debris on this right heel the left heel actually looks to be doing pretty well at this point I do not see any need for sharp debridement today. He actually tolerated the debridement today and postdebridement wound bed is significantly improved. Electronic Signature(s) Signed: 10/22/2023 12:39:10 PM By: Elijah Derry PA-C Entered By: Elijah Oliver on 10/22/2023 12:39:10 -------------------------------------------------------------------------------- Physician Orders Details Patient Name: Date of Service: Elijah Oliver RD K. 10/22/2023 10:00 A M Medical Record Number: [id number] Patient Account Number: [id number] Date of Birth/Sex: Treating RN: May 12, 1941 (82 y.o. 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 Bathing/ Shower/ Hygiene May shower; gently cleanse wound with antibacterial soap, rinse and pat dry prior to dressing wounds - Recommended to use  Dial original gold soap and shower on days of dressing change. No tub bath. Anesthetic (Use 'Patient Medications' Section for Anesthetic Order Entry) Lidocaine applied to wound bed Edema Control - Orders / Instructions Elevate, Exercise Daily and A void Standing for Long Periods of Time. Elevate legs to the level of the heart and pump ankles as often as possible Elevate leg(s) parallel to the floor when sitting. DO YOUR BEST to sleep in the bed at night. DO NOT sleep in your recliner. Long hours of sitting in a recliner leads to swelling of the legs and/or potential wounds on your backside. Wound Treatment Wound #1 - Calcaneus Wound Laterality: Right, Medial Cleanser: Byram Ancillary Kit -  15 Day Supply (DME) (Generic) 3 x Per Week/15 Days Discharge Instructions: Use supplies as instructed; Kit contains: (15) Saline Bullets; (15) 3x3 Gauze; 15 pr Gloves Cleanser: Wound Cleanser 3 x Per Week/15 Days Discharge Instructions: Wash your hands with soap and water. Remove old dressing, discard into plastic bag and place into trash. Cleanse the wound with Wound Cleanser prior to applying a clean dressing using gauze sponges, not tissues or cotton balls. Do not scrub or use excessive force. Pat dry using gauze sponges, not tissue or cotton balls. Prim Dressing: Hydrofera Blue Ready Transfer Foam, 4x5 (in/in) 3 x Per Week/15 Days ary Discharge Instructions: Apply Hydrofera Blue Ready to wound bed as directed Secondary Dressing: (BORDER) Zetuvit Plus SILICONE BORDER Dressing 5x5 (in/in) (DME) (Generic) 3 x Per Week/15 Days Discharge Instructions: Please do not put silicone bordered dressings under wraps. Use non-bordered dressing only. Wound #2 - Calcaneus Wound Laterality: Left Cleanser: Byram Ancillary Kit - 15 Day Supply 3 x Per Week/15 Days Discharge Instructions: Use supplies as instructed; Kit contains: (15) Saline Bullets; (15) 3x3 Gauze; 15 pr Gloves Cleanser: Wound Cleanser 3 x Per Week/15  Days Discharge Instructions: Wash your hands with soap and water. Remove old dressing, discard into plastic bag and place into trash. Cleanse the wound with Wound Cleanser prior to applying a clean dressing using gauze sponges, not tissues or cotton balls. Do not scrub or use excessive force. Pat dry using gauze sponges, not tissue or cotton balls. Prim Dressing: Hydrofera Blue Ready Transfer Foam, 2.5x2.5 (in/in) 3 x Per Week/15 Days ary Discharge Instructions: Apply Hydrofera Blue Ready to wound bed as directed Secondary Dressing: (BORDER) Zetuvit Plus SILICONE BORDER Dressing 4x4 (in/in) (DME) (Generic) 3 x Per Week/15 Days Discharge Instructions: Please do not put silicone bordered dressings under wraps. Use non-bordered dressing only. Electronic Signature(s) Signed: 10/22/2023 12:40:51 PM By: Elijah Oliver Signed: 10/22/2023 12:54:47 PM By: Elijah Derry PA-C Entered By: Elijah Oliver on 10/22/2023 12:22:24 -------------------------------------------------------------------------------- Problem List Details Patient Name: Date of Service: Elijah Oliver RD K. 10/22/2023 10:00 A M Medical Record Number: [id number] Patient Account Number: [id number] Date of Birth/Sex: Treating RN: 1941/01/04 (82 y.o. Elijah Oliver Primary Care Provider: Marisue Oliver Other Clinician: Referring Provider: Treating Provider/Extender: Rebecka Apley in Treatment: [address] 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 10/22/2023 10:00 A M Medical Record Number: [id number] Patient Account Number: [id number] Date of Birth/Sex: Treating RN: November 26, 1941 (82 y.o. Elijah Oliver Primary Care Provider: Marisue Oliver Other Clinician: Referring Provider: Treating Provider/Extender: Elijah Oliver Weeks in Treatment: 0 Subjective Chief Complaint Information obtained from Patient Bilateral Heel Ulcers History of Present Illness (HPI) Chronic/Inactive Condition: 10-22-2023 patient's ABIs on screening today were 0.93 on the left and the right and appear to be sufficient for wound healing. 10-22-2023 upon evaluation today patient appears to be doing somewhat poorly in regard to wounds on his heels he  has 1 on the left heel which is not really doing too badly and 1 on the right heel that is a little bit more significant at this point although there is a lot of callus the wound itself does not appear to be infected and in general I do not think this is too bad overall. The patient tells me he has not really been doing anything in particular from a dressing standpoint he is just been covering it with a sock primarily. Therefore we need to get some things moving as far as his wound is concerned with regard to dressings. I did advise him he really does not need to see podiatry as we can be taken care of the wounds and get this healed. 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 His most recent hemoglobin A1c was 6.8 and that was actually on 04-06-2023 Elijah Oliver, Elijah Oliver ([id number]) [id number] Page 6 of 10 Patient  History Information obtained from Patient, Chart. Allergies food extracts (Severity: Severe, Reaction: oriental food) Social History Never smoker, Alcohol Use - Never, Drug Use - No History, Caffeine Use - Daily - coffee. 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 Blood sugar is tested. Review of Systems (ROS) Constitutional Symptoms (General Health) Denies complaints or symptoms of Fatigue, Fever, Chills, Marked Weight Change. Eyes Denies complaints or symptoms of Dry Eyes, Vision Changes, Glasses / Contacts. Ear/Nose/Mouth/Throat Denies complaints or symptoms of Difficult clearing ears, Sinusitis. Hematologic/Lymphatic Denies complaints or symptoms of Bleeding / Clotting Disorders, Human Immunodeficiency Virus. Respiratory Complains or has symptoms of Shortness of Breath. Gastrointestinal Denies complaints or symptoms of Frequent diarrhea, Nausea, Vomiting. Genitourinary bladder CA, prostate CA Immunological Denies complaints or symptoms of Hives, Itching. Integumentary (Skin) Complains or has symptoms of Swelling, cellulitis Musculoskeletal Denies complaints or symptoms of Muscle Pain, Muscle Weakness. Oncologic bladder and prostate Psychiatric Denies complaints or symptoms of Anxiety, Claustrophobia. Objective Constitutional sitting or standing blood pressure is within target range for patient.. pulse regular and within target range for patient.Marland Kitchen respirations regular, non-labored and within target range for patient.Marland Kitchen  temperature within target range for patient.. Obese and well-hydrated in no acute distress. Vitals Time Taken: 10:45 AM, Height: 72 in, Source: Stated, Weight: 214 lbs, Source: Stated, BMI: 29, Temperature: 98 F, Pulse: 69 bpm, Respiratory Rate: 18 breaths/min, Blood Pressure: 118/69 mmHg. Eyes conjunctiva clear no eyelid edema noted. pupils equal round and reactive to light and accommodation. Ears, Nose, Mouth, and Throat no gross abnormality of ear auricles or external auditory canals. normal hearing noted during conversation. mucus membranes moist. Respiratory normal breathing without difficulty. Cardiovascular 2+ dorsalis pedis/posterior tibialis pulses. no clubbing, cyanosis, significant edema, Musculoskeletal normal gait and posture. no significant deformity or arthritic changes, no loss or range of motion, no clubbing. Psychiatric this patient is able to make decisions and demonstrates good insight into disease process. Alert and Oriented x 3. pleasant and cooperative. Elijah Oliver, Elijah Oliver ([id number]) [id number] Page 7 of 10 General Notes: Upon inspection patient's wound bed actually showed signs of good granulation and epithelization at this point. He actually seems to be doing quite well and  in general I think that he is going to heal well with regard to his wounds I am going to perform some debridement today to clearway necrotic debris on this right heel the left heel actually looks to be doing pretty well at this point I do not see any need for sharp debridement today. He actually tolerated the debridement today and postdebridement wound bed is significantly improved. Integumentary (Hair, Skin) Wound #1 status is Open. Original cause of wound was Blister. The date acquired was: 10/08/2023. The wound is located on the Right,Medial Calcaneus. The wound measures 4.5cm length x 5cm width x 0.1cm depth; 17.671cm^2 area and 1.767cm^3 volume. There is Fat Layer  (Subcutaneous Tissue) exposed. There is no tunneling or undermining noted. There is a medium amount of serosanguineous drainage noted. There is small (1-33%) red, pink granulation within the wound bed. There is a large (67-100%) amount of necrotic tissue within the wound bed including Adherent Slough. Wound #2 status is Open. Original cause of wound was Blister. The date acquired was: 10/08/2023. The wound is located on the Left Calcaneus. The wound measures 0.7cm length x 0.4cm width x 0.1cm depth; 0.22cm^2 area and 0.022cm^3 volume. There is no tunneling or undermining noted. There is a medium amount of serosanguineous drainage noted. There is medium (34-66%) pink, pale granulation within the wound bed. There is a medium (34-66%) amount of necrotic tissue within the wound bed including Adherent Slough. 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 There was a Excisional Skin/Subcutaneous Tissue Debridement with a total area of 17.66 sq cm performed by Elijah Derry, PA-C. With the following instrument(s): Curette to remove Viable and Non-Viable tissue/material. Material removed includes Callus, Subcutaneous Tissue, and Slough after achieving pain control using Lidocaine 4% Topical Solution. No specimens were taken. A time out was conducted at 11:29, prior to the start of the procedure. A Moderate amount of bleeding was controlled with Pressure. The procedure was tolerated well. Post Debridement Measurements: 4.5cm length x 5cm width x 0.1cm depth; 1.767cm^3 volume. Character of Wound/Ulcer Post  Debridement is stable. Severity of Tissue Post Debridement is: Fat layer exposed. Post procedure Diagnosis Wound #1: Same as Pre-Procedure Plan Follow-up Appointments: Return Appointment in 2 weeks. Bathing/ Shower/ Hygiene: May shower; gently cleanse wound with antibacterial soap, rinse and pat dry prior to dressing wounds - Recommended to use Dial original gold soap and shower on days of dressing change. No tub bath. Anesthetic (Use 'Patient Medications' Section for Anesthetic Order Entry): Lidocaine applied to wound bed Edema Control - Orders / Instructions: Elevate, Exercise Daily and Avoid Standing for Long Periods of Time. Elevate legs to the level of the heart and pump ankles as often as possible Elevate leg(s) parallel to the floor when sitting. DO YOUR BEST to sleep in the bed at night. DO NOT sleep in your recliner. Long hours of sitting in a recliner leads to swelling of the legs and/or potential wounds on your backside. WOUND #1: - Calcaneus Wound Laterality: Right, Medial Cleanser: Byram Ancillary Kit - 15 Day Supply (DME) (Generic) 3 x Per Week/15 Days Discharge Instructions: Use supplies as instructed; Kit contains: (15) Saline Bullets; (15) 3x3 Gauze;  15 pr Gloves Cleanser: Wound Cleanser 3 x Per Week/15 Days Discharge Instructions: Wash your hands with soap and water. Remove old dressing, discard into plastic bag and place into trash. Cleanse the wound with Wound Cleanser prior to applying a clean dressing using gauze sponges, not tissues or cotton balls. Do not scrub or use excessive force. Pat dry using gauze sponges, not tissue or cotton balls. Prim Dressing: Hydrofera Blue Ready Transfer Foam, 4x5 (in/in) 3 x Per Week/15 Days ary Discharge Instructions: Apply Hydrofera Blue Ready to wound bed as directed Secondary Dressing: (BORDER) Zetuvit Plus SILICONE BORDER Dressing 5x5 (in/in) (DME) (Generic) 3 x Per Week/15 Days Discharge Instructions: Please do not put silicone  bordered dressings under wraps. Use non-bordered dressing only. WOUND #2: - Calcaneus Wound Laterality: Left Cleanser: Byram Ancillary Kit - 15 Day Supply 3 x Per Week/15 Days Discharge Instructions: Use supplies as instructed; Kit contains: (15) Saline Bullets; (15) 3x3 Gauze; 15 pr Gloves Cleanser: Wound Cleanser 3 x Per Week/15 Days Discharge Instructions: Wash your hands with soap and water. Remove old dressing, discard into plastic bag and place into trash. Cleanse the wound with Wound Cleanser prior to applying a clean dressing using gauze sponges, not tissues or cotton balls. Do not scrub or use excessive force. Pat dry using gauze sponges, not tissue or cotton balls. Prim Dressing: Hydrofera Blue Ready Transfer Foam, 2.5x2.5 (in/in) 3 x Per [address] Elijah Oliver, Elijah Oliver ([id number]) [id number] Page 8 of 10 Discharge Instructions: Apply Hydrofera Blue Ready to wound bed as directed Secondary Dressing: (BORDER) Zetuvit Plus SILICONE BORDER Dressing 4x4 (in/in) (DME) (Generic) 3 x Per Week/15 Days Discharge Instructions: Please do not put silicone bordered dressings under wraps. Use non-bordered dressing only. 1. I would recommend based on what we are seeing that we going to have the patient continue with a recommendation for Hydrofera Blue over the next couple of weeks. We can see how things do. 2. I am going to recommend as well that he should continue to monitor for any signs of infection or worsening. With that being said I think he is really doing quite well I do not see any need for any additional antibiotics quite yet. 3. I am going to suggest as well based on what we are seeing that the patient will likely heal quite readily with appropriate wound care although I do want to keep a close eye on things and ensure nothing gets worse the goal ultimately is preventing any risk of amputation. We will see patient back for reevaluation in 2 weeks here in the  clinic. If anything worsens or changes patient will contact our office for additional recommendations. Electronic Signature(s) Signed: 10/22/2023 12:41:47 PM By: Elijah Derry PA-C Entered By: Elijah Oliver on 10/22/2023 12:41:47 -------------------------------------------------------------------------------- ROS/PFSH Details Patient Name: Date of Service: Elijah Oliver RD K. 10/22/2023 10:00 A M Medical Record Number: [id number] Patient Account Number: [id number] Date of Birth/Sex: Treating RN: 09-Apr-1941 (82 y.o. Elijah Oliver Primary Care Provider: Marisue Oliver Other Clinician: Referring Provider: Treating Provider/Extender: Elijah Oliver Weeks in Treatment: 0 Information Obtained From Patient Chart Constitutional Symptoms (General Health) Complaints and Symptoms: Negative for: Fatigue; Fever; Chills; Marked Weight Change Eyes Complaints and Symptoms: Negative for: Dry Eyes; Vision Changes; Glasses / Contacts Medical History: Positive for: Glaucoma Ear/Nose/Mouth/Throat Complaints and Symptoms: Negative for: Difficult clearing ears; Sinusitis Hematologic/Lymphatic Complaints and Symptoms: Negative for: Bleeding / Clotting Disorders; Human Immunodeficiency Virus Respiratory Complaints and Symptoms: Positive for: Shortness of Breath Medical  History: Positive for: Sleep Apnea Elijah Oliver, Elijah Oliver ([id number]) [id number] Page 9 of 10 Gastrointestinal Complaints and Symptoms: Negative for: Frequent diarrhea; Nausea; Vomiting Immunological Complaints and Symptoms: Negative for: Hives; Itching Integumentary (Skin) Complaints and Symptoms: Positive for: Swelling Review of System Notes: cellulitis Musculoskeletal Complaints and Symptoms: Negative for: Muscle Pain; Muscle Weakness Psychiatric Complaints and Symptoms: Negative for: Anxiety; Claustrophobia Cardiovascular Medical History: Positive for: Coronary Artery  Disease; Hypertension Endocrine Medical History: Positive for: Type II Diabetes Time with diabetes: 15 Treated with: Oral agents Blood sugar tested every day: Yes Tested : Genitourinary Complaints and Symptoms: Review of System Notes: bladder CA, prostate CA Medical History: Positive for: 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 Neurologic Medical History: Positive for: Neuropathy; Seizure Disorder - last seizure yrs ago Oncologic Complaints and Symptoms: Review of System Notes: bladder and prostate Medical History: Negative for: Received Chemotherapy; Received Radiation HBO Extended History Items Eyes: Glaucoma Immunizations Pneumococcal Vaccine: Received Pneumococcal Vaccination: Yes Received Pneumococcal Vaccination On or After 60th Birthday: Yes Implantable Devices None Family and Social History Never smoker; Alcohol Use: Never; Drug Use: No History; Caffeine Use: Daily - coffee Elijah Oliver, Elijah Oliver ([id number]) [id number] Page 10 of 10 Electronic Signature(s) Signed: 10/22/2023 12:40:51 PM By: Elijah Oliver Signed: 10/22/2023 12:54:47 PM By: Elijah Derry PA-C Entered By: Elijah Oliver on 10/22/2023 10:51:46 -------------------------------------------------------------------------------- SuperBill Details Patient Name: Date of Service: Elijah Oliver RD K. 10/22/2023 Medical Record Number: [id number] Patient Account Number: [id number] Date of Birth/Sex: Treating RN: [birth year] (82 y.o. 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 I10 Essential (primary)  hypertension Facility Procedures : CPT4 Code: [id number] Description: 99213 - WOUND CARE VISIT-LEV 3 EST PT Modifier: Quantity: 1 : CPT4 Code: 87564332 Description: 11042 - DEB SUBQ TISSUE 20 SQ CM/< ICD-10 Diagnosis Description L89.612 Pressure ulcer of right heel, stage 2 Modifier: Quantity: 1 Physician Procedures : CPT4 Code Description Modifier [id number] WC PHYS LEVEL 3 NEW PT 25 ICD-10 Diagnosis Description E11.621 Type 2 diabetes mellitus with foot ulcer L89.612 Pressure ulcer of right heel, stage 2 Y60.630 Pressure ulcer of left heel, stage 2 E11.43 Type 2  diabetes mellitus with diabetic autonomic (poly)neuropathy Quantity: 1 : 11042 11042 - WC PHYS SUBQ TISS 20 SQ CM ICD-10 Diagnosis Description L89.612 Pressure ulcer of right heel, stage 2 Quantity: 1 Electronic Signature(s) Signed: 10/22/2023 12:42:19 PM By: Elijah Derry PA-C Entered By: Elijah Oliver on 10/22/2023 12:42:19

## 2023-11-02 ENCOUNTER — Encounter: Payer: Medicare Other | Attending: Physician Assistant | Admitting: Physician Assistant

## 2023-11-02 DIAGNOSIS — N186 End stage renal disease: Secondary | ICD-10-CM | POA: Diagnosis not present

## 2023-11-02 DIAGNOSIS — I120 Hypertensive chronic kidney disease with stage 5 chronic kidney disease or end stage renal disease: Secondary | ICD-10-CM | POA: Insufficient documentation

## 2023-11-02 DIAGNOSIS — E11621 Type 2 diabetes mellitus with foot ulcer: Secondary | ICD-10-CM | POA: Insufficient documentation

## 2023-11-02 DIAGNOSIS — H409 Unspecified glaucoma: Secondary | ICD-10-CM | POA: Diagnosis not present

## 2023-11-02 DIAGNOSIS — L89622 Pressure ulcer of left heel, stage 2: Secondary | ICD-10-CM | POA: Insufficient documentation

## 2023-11-02 DIAGNOSIS — L89612 Pressure ulcer of right heel, stage 2: Secondary | ICD-10-CM | POA: Diagnosis not present

## 2023-11-02 DIAGNOSIS — E1143 Type 2 diabetes mellitus with diabetic autonomic (poly)neuropathy: Secondary | ICD-10-CM | POA: Diagnosis not present

## 2023-11-02 DIAGNOSIS — E1122 Type 2 diabetes mellitus with diabetic chronic kidney disease: Secondary | ICD-10-CM | POA: Insufficient documentation

## 2023-11-02 DIAGNOSIS — G4730 Sleep apnea, unspecified: Secondary | ICD-10-CM | POA: Insufficient documentation

## 2023-11-02 DIAGNOSIS — G40909 Epilepsy, unspecified, not intractable, without status epilepticus: Secondary | ICD-10-CM | POA: Insufficient documentation

## 2023-11-02 NOTE — Progress Notes (Signed)
DAVIDSON, SALEH ([id number]) [id number] Page 1 of 10 Visit Report for 11/02/2023 Arrival Information Details Patient Name: Date of Service: Elijah Peers RD Oliver. 11/02/2023 2:45 PM Medical Record Number: [id number] Patient Account Number: [id number] Date of Birth/Sex: Treating RN: 1941/03/15 (82 y.o. [name] Primary Care Dartanyan Deasis: Marisue Ivan Other Clinician: Referring Elijah Oliver: Treating Lambert Jeanty/Extender: Rebecka Apley in Treatment: 1 Visit Information History Since Last Visit Added or deleted any medications: No Patient Arrived: Ambulatory Any new allergies or adverse reactions: No Arrival Time: 15:40 Has Dressing in Place as Prescribed: Yes Accompanied By: self Pain Present Now: No Transfer Assistance: None Patient Identification Verified: Yes Secondary Verification Process Completed: Yes Patient Requires Transmission-Based Precautions: No Patient Has Alerts: Yes Patient Alerts: Patient on Blood Thinner type 2 diabetic Electronic Signature(s) Unsigned Entered By: Midge Aver on 11/02/2023 15:41:19 -------------------------------------------------------------------------------- Clinic Level of Care Assessment Details Patient Name: Date of Service: Elijah Peers RD Oliver. 11/02/2023 2:45 PM Medical Record Number: [id number] Patient Account Number: [id number] Date of Birth/Sex: Treating RN: 1941/07/18 (82 y.o. [name] Primary Care Fatih Stalvey: Marisue Ivan Other Clinician: Referring Kensi Karr: Treating Durelle Zepeda/Extender: Rebecka Apley in Treatment: 1 Clinic Level of Care Assessment Items TOOL 1 Quantity Score []  - 0 Use when EandM and Procedure is performed on INITIAL visit ASSESSMENTS - Nursing Assessment / Reassessment []  - 0 General Physical Exam (combine w/ comprehensive assessment (listed just below) when performed on new pt. evals) []  - 0 Comprehensive Assessment (HX, ROS,  Risk Assessments, Wounds Hx, etc.) ASSESSMENTS - Wound and Skin Assessment / Reassessment Elijah Oliver, Elijah Oliver ([id number]) [id number] Page 2 of 10 []  - 0 Dermatologic / Skin Assessment (not related to wound area) ASSESSMENTS - Ostomy and/or Continence Assessment and Care []  - 0 Incontinence Assessment and Management []  - 0 Ostomy Care Assessment and Management (repouching, etc.) PROCESS - Coordination of Care []  - 0 Simple Patient / Family Education for ongoing care []  - 0 Complex (extensive) Patient / Family Education for ongoing care []  - 0 Staff obtains Chiropractor, Records, T Results / Process Orders est []  - 0 Staff telephones HHA, Nursing Homes / Clarify orders / etc []  - 0 Routine Transfer to another Facility (non-emergent condition) []  - 0 Routine Hospital Admission (non-emergent condition) []  - 0 New Admissions / Manufacturing engineer / Ordering NPWT Apligraf, etc. , []  - 0 Emergency Hospital Admission (emergent condition) PROCESS - Special Needs []  - 0 Pediatric / Minor Patient Management []  - 0 Isolation Patient Management []  - 0 Hearing / Language / Visual special needs []  - 0 Assessment of Community assistance (transportation, D/C planning, etc.) []  - 0 Additional assistance / Altered mentation []  - 0 Support Surface(s) Assessment (bed, cushion, seat, etc.) INTERVENTIONS - Miscellaneous []  - 0 External ear exam []  - 0 Patient Transfer (multiple staff / Nurse, adult / Similar devices) []  - 0 Simple Staple / Suture removal (25 or less) []  - 0 Complex Staple / Suture removal (26 or more) []  - 0 Hypo/Hyperglycemic Management (do not check if billed separately) []  - 0 Ankle / Brachial Index (ABI) - do not check if billed separately Has the patient been seen at the hospital within the last three years: Yes Total Score: 0 Level Of Care: ____ Electronic Signature(s) Unsigned Entered By: Midge Aver on 11/02/2023  15:49:59 -------------------------------------------------------------------------------- Encounter Discharge Information Details Patient Name: Date of Service: Elijah Peers RD Oliver. 11/02/2023 2:45 PM Medical Record Number: [id number] Patient Account Number:  [id number] Date of Birth/Sex: Treating RN: [birth year] (82 y.o. [name] Primary Care Kasson Lamere: Marisue Ivan Other Clinician: Referring Elijah Oliver: Treating Gavriela Cashin/Extender: Ezequel, Capua ([id number]) [phone].pdf Page 3 of 10 Weeks in Treatment: 1 Encounter Discharge Information Items Post Procedure Vitals Discharge Condition: Stable Temperature (F): 98.1 Ambulatory Status: Ambulatory Pulse (bpm): 67 Discharge Destination: Home Respiratory Rate (breaths/min): 18 Transportation: Private Auto Blood Pressure (mmHg): 131/68 Accompanied By: self Schedule Follow-up Appointment: Yes Clinical Summary of Care: Patient Declined Electronic Signature(s) Unsigned Entered By: Midge Aver on 11/02/2023 15:52:14 -------------------------------------------------------------------------------- Lower Extremity Assessment Details Patient Name: Date of Service: Elijah Peers RD Oliver. 11/02/2023 2:45 PM Medical Record Number: [id number] Patient Account Number: [id number] Date of Birth/Sex: Treating RN: 1941-01-17 (82 y.o. [name] Primary Care Kateryna Grantham: Marisue Ivan Other Clinician: Referring Nayelli Inglis: Treating Jabarie Pop/Extender: Maxwell Caul Weeks in Treatment: 1 Electronic Signature(s) Unsigned Entered By: Midge Aver on 11/02/2023 15:45:53 -------------------------------------------------------------------------------- Multi Wound Chart Details Patient Name: Date of Service: Elijah Peers RD Oliver. 11/02/2023 2:45 PM Medical Record Number: [id number] Patient Account Number: [id number] Date of Birth/Sex: Treating RN: 1941-09-04 (82 y.o. [name] Primary Care Dakayla Disanti: Marisue Ivan Other Clinician: Referring Elijah Oliver: Treating Chanse Kagel/Extender: Maxwell Caul Weeks in Treatment: 1 Vital Signs Height(in): 72 Pulse(bpm): 67 Weight(lbs): 214 Blood Pressure(mmHg): 131/68 Body Mass Index(BMI): 29 Temperature(F): 98.1 Elijah Oliver, Elijah Oliver ([id number]) [id number] Page 4 of 10 Respiratory Rate(breaths/min): 18 [1:Photos:] [N/A:N/A] Right, Medial Calcaneus Left Calcaneus N/A Wound Location: Blister Blister N/A Wounding Event: Diabetic Wound/Ulcer of the Lower Diabetic Wound/Ulcer of the Lower N/A Primary Etiology: Extremity Extremity Glaucoma, Sleep Apnea, Coronary Glaucoma, Sleep Apnea, Coronary N/A Comorbid History: Artery Disease, Hypertension, Type II Artery Disease, Hypertension, Type II Diabetes, End Stage Renal Disease, Diabetes, End Stage Renal Disease, Neuropathy, Seizure Disorder Neuropathy, Seizure Disorder 10/08/2023 10/08/2023 N/A Date Acquired: 1 1 N/A Weeks of Treatment: Open Open N/A Wound Status: No No N/A Wound Recurrence: 4x4.8x0.2 0.5x0.3x0.1 N/A Measurements L x W x D (cm) 15.08 0.118 N/A A (cm) : rea 3.016 0.012 N/A Volume (cm) : 14.70% 46.40% N/A % Reduction in A rea: -70.70% 45.50% N/A % Reduction in Volume: Grade 2 Grade 2 N/A Classification: Medium Medium N/A Exudate A mount: Serosanguineous Serosanguineous N/A Exudate Type: red, brown red, brown N/A Exudate Color: Small (1-33%) Medium (34-66%) N/A Granulation A mount: Red, Pink Pink, Pale N/A Granulation Quality: Large (67-100%) Medium (34-66%) N/A Necrotic A mount: Fat Layer (Subcutaneous Tissue): Yes N/A N/A Exposed Structures: None None N/A Epithelialization: Treatment Notes Electronic Signature(s) Unsigned Entered By: Midge Aver on 11/02/2023 15:46:54 -------------------------------------------------------------------------------- Multi-Disciplinary Care Plan  Details Patient Name: Date of Service: Elijah Peers RD Oliver. 11/02/2023 2:45 PM Medical Record Number: [id number] Patient Account Number: [id number] Date of Birth/Sex: Treating RN: 1941/09/24 (82 y.o. [name] Primary Care Seena Ritacco: Marisue Ivan Other Clinician: Referring Elijah Oliver: Treating Eitan Doubleday/Extender: Rebecka Apley in Treatment: 1 Active Inactive Necrotic Tissue Elijah Oliver, Elijah Oliver ([id number]) [id number] Page 5 of 10 Nursing Diagnoses: Impaired tissue integrity related to necrotic/devitalized tissue Knowledge deficit related to management of necrotic/devitalized tissue Goals: Necrotic/devitalized tissue will be minimized in the wound bed Date Initiated: 10/22/2023 Target Resolution Date: 12/17/2023 Goal Status: Active Patient/caregiver will verbalize understanding of reason and process for debridement of necrotic tissue Date Initiated: 10/22/2023 Date Inactivated: 10/22/2023 Target Resolution Date: 10/22/2023 Goal Status: Met Interventions: Assess patient pain level pre-, during and post procedure and prior to discharge Provide education on necrotic  tissue and debridement process Treatment Activities: Apply topical anesthetic as ordered : 10/22/2023 Excisional debridement : 10/22/2023 Notes: Wound/Skin Impairment Nursing Diagnoses: Impaired tissue integrity Knowledge deficit related to ulceration/compromised skin integrity Goals: Ulcer/skin breakdown will have a volume reduction of 30% by week 4 Date Initiated: 10/22/2023 Target Resolution Date: 11/19/2023 Goal Status: Active Ulcer/skin breakdown will have a volume reduction of 50% by week 8 Date Initiated: 10/22/2023 Target Resolution Date: 12/17/2023 Goal Status: Active Ulcer/skin breakdown will have a volume reduction of 80% by week 12 Date Initiated: 10/22/2023 Target Resolution Date: 01/14/2024 Goal Status: Active Ulcer/skin breakdown will heal  within 14 weeks Date Initiated: 10/22/2023 Target Resolution Date: 01/28/2024 Goal Status: Active Interventions: Assess patient/caregiver ability to obtain necessary supplies Assess patient/caregiver ability to perform ulcer/skin care regimen upon admission and as needed Assess ulceration(s) every visit Provide education on ulcer and skin care Notes: Electronic Signature(s) Unsigned Entered By: Midge Aver on 11/02/2023 15:50:22 -------------------------------------------------------------------------------- Pain Assessment Details Patient Name: Date of Service: Elijah Peers RD Oliver. 11/02/2023 2:45 PM Medical Record Number: [id number] Patient Account Number: [id number] Date of Birth/Sex: Treating RN: Aug 26, 1941 (82 y.o. [name] Primary Care Elijah Oliver: Marisue Ivan Other Clinician: VLADYSLAV, Elijah Oliver ([id number]) [id number] Page 6 of 10 Referring Elijah Oliver: Treating Virga Haltiwanger/Extender: Rebecka Apley in Treatment: 1 Active Problems Location of Pain Severity and Description of Pain Patient Has Paino No Site Locations Pain Management and Medication Current Pain Management: Electronic Signature(s) Unsigned Entered By: Midge Aver on 11/02/2023 15:42:27 -------------------------------------------------------------------------------- Patient/Caregiver Education Details Patient Name: Date of Service: Elijah Peers RD Oliver. 12/3/2024andnbsp2:45 PM Medical Record Number: [id number] Patient Account Number: [id number] Date of Birth/Gender: Treating RN: [birth year] (82 y.o. [name] Primary Care Physician: Marisue Ivan Other Clinician: Referring Physician: Treating Physician/Extender: Rebecka Apley in Treatment: 1 Education Assessment Education Provided To: Patient Education Topics Provided Wound Debridement: Handouts: Wound Debridement Methods: Explain/Verbal Responses: State content  correctly Wound/Skin Impairment: Handouts: Caring for Your Ulcer Methods: Explain/Verbal Responses: State content correctly Elijah Oliver, Elijah Oliver ([id number]) [id number] Page 7 of 10 Electronic Signature(s) Unsigned Entered By: Midge Aver on 11/02/2023 15:50:40 -------------------------------------------------------------------------------- Wound Assessment Details Patient Name: Date of Service: Elijah Peers RD Oliver. 11/02/2023 2:45 PM Medical Record Number: [id number] Patient Account Number: [id number] Date of Birth/Sex: Treating RN: [birth year] (82 y.o. [name] Primary Care Caitlin Hillmer: Marisue Ivan Other Clinician: Referring Elijah Oliver: Treating Toddy Boyd/Extender: Maxwell Caul Weeks in Treatment: 1 Wound Status Wound Number: 1 Primary Diabetic Wound/Ulcer of the Lower Extremity Etiology: Wound Location: Right, Medial Calcaneus Wound Open Wounding Event: Blister Status: Date Acquired: 10/08/2023 Comorbid Glaucoma, Sleep Apnea, Coronary Artery Disease, Hypertension, Weeks Of Treatment: 1 History: Type II Diabetes, End Stage Renal Disease, Neuropathy, Seizure Clustered Wound: No Disorder Photos Wound Measurements Length: (cm) 4 Width: (cm) 4.8 Depth: (cm) 0.2 Area: (cm) 15.08 Volume: (cm) 3.016 % Reduction in Area: 14.7% % Reduction in Volume: -70.7% Epithelialization: None Wound Description Classification: Grade 2 Exudate Amount: Medium Exudate Type: Serosanguineous Exudate Color: red, brown Foul Odor After Cleansing: No Slough/Fibrino Yes Wound Bed Granulation Amount: Small (1-33%) Exposed Structure Granulation Quality: Red, Pink Fat Layer (Subcutaneous Tissue) Exposed: Yes Necrotic Amount: Large (67-100%) Necrotic Quality: [address] Rosalie Oliver ([id number]) [id number] Page 8 of 10 Treatment Notes Wound #1 (Calcaneus) Wound Laterality: Right, Medial Cleanser Byram Ancillary  Kit - 15 Day Supply Discharge Instruction: Use supplies as instructed; Kit contains: (15) Saline Bullets; (15) 3x3 Gauze; 15 pr Gloves Wound Cleanser Discharge  Instruction: Wash your hands with soap and water. Remove old dressing, discard into plastic bag and place into trash. Cleanse the wound with Wound Cleanser prior to applying a clean dressing using gauze sponges, not tissues or cotton balls. Do not scrub or use excessive force. Pat dry using gauze sponges, not tissue or cotton balls. Peri-Wound Care Topical Primary Dressing Hydrofera Blue Ready Transfer Foam, 4x5 (in/in) Discharge Instruction: Apply Hydrofera Blue Ready to wound bed as directed Secondary Dressing (BORDER) Zetuvit Plus SILICONE BORDER Dressing 5x5 (in/in) Discharge Instruction: Please do not put silicone bordered dressings under wraps. Use non-bordered dressing only. Secured With Compression Wrap Compression Stockings Add-Ons Electronic Signature(s) Unsigned Entered By: Midge Aver on 11/02/2023 15:44:01 -------------------------------------------------------------------------------- Wound Assessment Details Patient Name: Date of Service: Elijah Peers RD Oliver. 11/02/2023 2:45 PM Medical Record Number: [id number] Patient Account Number: [id number] Date of Birth/Sex: Treating RN: 10/15/1941 (82 y.o. [name] Primary Care Taedyn Glasscock: Marisue Ivan Other Clinician: Referring Elijah Oliver: Treating Sayaka Hoeppner/Extender: Maxwell Caul Weeks in Treatment: 1 Wound Status Wound Number: 2 Primary Diabetic Wound/Ulcer of the Lower Extremity Etiology: Wound Location: Left Calcaneus Wound Open Wounding Event: Blister Status: Date Acquired: 10/08/2023 Comorbid Glaucoma, Sleep Apnea, Coronary Artery Disease, Hypertension, Weeks Of Treatment: 1 History: Type II Diabetes, End Stage Renal Disease, Neuropathy, Seizure Clustered Wound: No Disorder Photos Elijah Oliver, Elijah Oliver ([id number])  [id number] Page 9 of 10 Wound Measurements Length: (cm) 0.5 Width: (cm) 0.3 Depth: (cm) 0.1 Area: (cm) 0.118 Volume: (cm) 0.012 % Reduction in Area: 46.4% % Reduction in Volume: 45.5% Epithelialization: None Wound Description Classification: Grade 2 Exudate Amount: Medium Exudate Type: Serosanguineous Exudate Color: red, brown Foul Odor After Cleansing: No Slough/Fibrino Yes Wound Bed Granulation Amount: Medium (34-66%) Granulation Quality: Pink, Pale Necrotic Amount: Medium (34-66%) Necrotic Quality: Adherent Slough Treatment Notes Wound #2 (Calcaneus) Wound Laterality: Left Cleanser Byram Ancillary Kit - 15 Day Supply Discharge Instruction: Use supplies as instructed; Kit contains: (15) Saline Bullets; (15) 3x3 Gauze; 15 pr Gloves Wound Cleanser Discharge Instruction: Wash your hands with soap and water. Remove old dressing, discard into plastic bag and place into trash. Cleanse the wound with Wound Cleanser prior to applying a clean dressing using gauze sponges, not tissues or cotton balls. Do not scrub or use excessive force. Pat dry using gauze sponges, not tissue or cotton balls. Peri-Wound Care Topical Primary Dressing Hydrofera Blue Ready Transfer Foam, 2.5x2.5 (in/in) Discharge Instruction: Apply Hydrofera Blue Ready to wound bed as directed Secondary Dressing (BORDER) Zetuvit Plus SILICONE BORDER Dressing 4x4 (in/in) Discharge Instruction: Please do not put silicone bordered dressings under wraps. Use non-bordered dressing only. Secured With Compression Wrap Compression Stockings Add-Ons Electronic Signature(s) Unsigned Entered By: Midge Aver on 11/02/2023 15:44:22 Signature(s): BALJINDER, KRECH ([id number]) [id number] Date(s): 0.pdf Page 10 of 10 -------------------------------------------------------------------------------- Vitals Details Patient Name: Date of Service: Elijah Peers RD Oliver. 11/02/2023  2:45 PM Medical Record Number: [id number] Patient Account Number: [id number] Date of Birth/Sex: Treating RN: 1941/04/29 (82 y.o. [name] Primary Care Dayshawn Irizarry: Marisue Ivan Other Clinician: Referring Vennessa Affinito: Treating Mychelle Kendra/Extender: Maxwell Caul Weeks in Treatment: 1 Vital Signs Time Taken: 15:35 Temperature (F): 98.1 Height (in): 72 Pulse (bpm): 67 Weight (lbs): 214 Respiratory Rate (breaths/min): 18 Body Mass Index (BMI): 29 Blood Pressure (mmHg): 131/68 Reference Range: 80 - 120 mg / dl Electronic Signature(s) Unsigned Entered By: Midge Aver on 11/02/2023 15:42:16 Signature(s): Date(s):

## 2023-11-02 NOTE — Progress Notes (Signed)
GUILLAUME, Oliver ([id number]) [id number] Page 1 of 9 Visit Report for 11/02/2023 Chief Complaint Document Details Patient Name: Date of Service: Elijah Oliver RD K. 11/02/2023 2:45 PM Medical Record Number: [id number] Patient Account Number: [id number] Date of Birth/Sex: Treating RN: Aug 28, 1941 (82 y.o. Elijah Oliver Primary Care Provider: Marisue Oliver Other Clinician: Referring Provider: Treating Provider/Extender: Elijah Oliver in Treatment: 1 Information Obtained from: Patient Chief Complaint Bilateral Heel Ulcers Electronic Signature(s) Signed: 11/02/2023 3:39:28 PM By: Elijah Oliver Entered By: Elijah Derry on 11/02/2023 15:39:28 -------------------------------------------------------------------------------- Debridement Details Patient Name: Date of Service: Elijah Oliver RD K. 11/02/2023 2:45 PM Medical Record Number: [id number] Patient Account Number: [id number] Date of Birth/Sex: Treating RN: 09-Oct-1941 (82 y.o. Elijah Oliver Primary Care Provider: Marisue Oliver Other Clinician: Referring Provider: Treating Provider/Extender: Elijah Oliver in Treatment: 1 Debridement Performed for Assessment: Wound #1 Right,Medial Calcaneus Performed By: Physician Elijah Derry, Oliver The following information was scribed by: Elijah Oliver The information was scribed for: Elijah Derry Debridement Type: Debridement Severity of Tissue Pre Debridement: Fat layer exposed Level of Consciousness (Pre-procedure): Awake and Alert Pre-procedure Verification/Time Out Yes - 15:47 Taken: Start Time: 15:47 Pain Control: Lidocaine 4% T opical Solution Percent of Wound Bed Debrided: 100% T Area Debrided (cm): otal 15.07 Tissue and other material debrided: Viable, Non-Viable, Slough, Subcutaneous, Slough Level: Skin/Subcutaneous Tissue Debridement Description: Excisional Instrument: Curette Bleeding:  Moderate DEO, LATINI ([id number]) [id number] Page 2 of 9 Hemostasis Achieved: Pressure Procedural Pain: 0 Post Procedural Pain: 0 Response to Treatment: Procedure was tolerated well Level of Consciousness (Post- Awake and Alert procedure): Post Debridement Measurements of Total Wound Length: (cm) 4 Width: (cm) 4.8 Depth: (cm) 0.2 Volume: (cm) 3.016 Character of Wound/Ulcer Post Debridement: Stable Severity of Tissue Post Debridement: Fat layer exposed Post Procedure Diagnosis Same as Pre-procedure Electronic Signature(s) Unsigned Entered By: Elijah Oliver on 11/02/2023 15:48:21 -------------------------------------------------------------------------------- Debridement Details Patient Name: Date of Service: Elijah Oliver RD K. 11/02/2023 2:45 PM Medical Record Number: [id number] Patient Account Number: [id number] Date of Birth/Sex: Treating RN: June 02, 1941 (82 y.o. Elijah Oliver Primary Care Provider: Marisue Oliver Other Clinician: Referring Provider: Treating Provider/Extender: Elijah Oliver in Treatment: 1 Debridement Performed for Assessment: Wound #2 Left Calcaneus Performed By: Physician Elijah Derry, Oliver The following information was scribed by: Elijah Oliver The information was scribed for: Elijah Derry Debridement Type: Debridement Severity of Tissue Pre Debridement: Fat layer exposed Level of Consciousness (Pre-procedure): Awake and Alert Pre-procedure Verification/Time Out Yes - 15:47 Taken: Start Time: 15:47 Pain Control: Lidocaine 4% T opical Solution Percent of Wound Bed Debrided: 100% T Area Debrided (cm): otal 0.12 Tissue and other material debrided: Viable, Non-Viable, Skin: Dermis , Skin: Epidermis Level: Skin/Epidermis Debridement Description: Selective/Open Wound Instrument: Curette Bleeding: Moderate Hemostasis Achieved: Pressure Procedural Pain: 0 Post Procedural Pain: 0 Response to  Treatment: Procedure was tolerated well Level of Consciousness (Post- Awake and Alert procedure): Post Debridement Measurements of Total Wound Length: (cm) 0.5 Width: (cm) 0.3 Depth: (cm) 0.1 MILLEN, GEHRES ([id number]) [id number] Page 3 of 9 Volume: (cm) 0.012 Character of Wound/Ulcer Post Debridement: Stable Severity of Tissue Post Debridement: Fat layer exposed Post Procedure Diagnosis Same as Pre-procedure Electronic Signature(s) Unsigned Entered By: Elijah Oliver on 11/02/2023 15:49:14 -------------------------------------------------------------------------------- HPI Details Patient Name: Date of Service: Elijah Oliver RD K. 11/02/2023 2:45 PM Medical Record Number: [id number] Patient Account Number: [id number] Date of Birth/Sex: Treating RN: 1941/09/22 (82 y.o. Elijah Bolt,  Chip Oliver Primary Care Provider: Marisue Oliver Other Clinician: Referring Provider: Treating Provider/Extender: Elijah Oliver in Treatment: 1 History of Present Illness Chronic/Inactive Conditions Condition 1: 10-22-2023 patient's ABIs on screening today were 0.93 on the left and the right and appear to be sufficient for wound healing. HPI Description: 10-22-2023 upon evaluation today patient appears to be doing somewhat poorly in regard to wounds on his heels he has 1 on the left heel which is not really doing too badly and 1 on the right heel that is a little bit more significant at this point although there is a lot of callus the wound itself does not appear to be infected and in general I do not think this is too bad overall. The patient tells me he has not really been doing anything in particular from a dressing standpoint he is just been covering it with a sock primarily. Therefore we need to get some things moving as far as his wound is concerned with regard to dressings. I did advise him he really does not need to see podiatry as we can be taken  care of the wounds and get this healed. 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 His most recent hemoglobin A1c was 6.8 and that was actually on 04-06-2023 11-02-2023 upon evaluation patient's wounds are showing signs of improvement. With that being said he is definitely getting being needed some sharp debridement today to clearway some of the necrotic debris. Fortunately I do not see any signs of active infection Electronic Signature(s) Signed: 11/02/2023 4:15:50 PM By: Elijah Oliver Entered By: Elijah Derry on 11/02/2023 16:15:49 -------------------------------------------------------------------------------- Physical Exam Details Patient Name: Date of Service: Elijah Oliver RD K. 11/02/2023 2:45 PM Medical Record Number: [id number] Patient Account Number: [id number] Date of Birth/Sex: Treating RN: 07-19-1941 (82 y.o. Smyan, Ehrman, Irwin K ([id number]) [id number] Page 4 of 9 Primary Care Provider: Marisue Oliver Other Clinician: Referring Provider: Treating Provider/Extender: Rebecka Apley in Treatment: 1 Constitutional Well-nourished and well-hydrated in no acute distress. Respiratory normal breathing without difficulty. Psychiatric this patient is able to make decisions and demonstrates good insight into disease process. Alert and Oriented x 3. pleasant and cooperative. Notes Patient's wound bed showed signs of good granulation and epithelization currently. I do not see any evidence of worsening overall and I do believe that the patient is making good headway here towards closure which is great news. Electronic Signature(s) Signed: 11/02/2023 4:21:21 PM By: Elijah Oliver Entered By: Elijah Derry on 11/02/2023 16:21:17 -------------------------------------------------------------------------------- Physician Orders Details Patient Name: Date of Service: Elijah Oliver RD K. 11/02/2023 2:45 PM Medical Record Number: [id number] Patient Account Number: [id number] Date of Birth/Sex: Treating RN: [birth year] (82 y.o. 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 Bathing/ Shower/ Hygiene May shower; gently cleanse wound with antibacterial soap, rinse and pat dry prior to dressing wounds - Recommended to use Dial original gold soap and shower on days  of dressing change. No tub bath. Anesthetic (Use 'Patient Medications' Section for Anesthetic Order Entry) Lidocaine applied to wound bed Edema Control - Orders / Instructions Elevate, Exercise Daily and A void Standing for Long Periods of Time. Elevate legs to the level of the heart and pump ankles as often as possible Elevate leg(s) parallel to the floor when sitting. DO YOUR BEST to sleep in the bed at night. DO NOT sleep in your recliner. Long hours of sitting in a recliner leads to swelling of the legs and/or potential wounds on your backside. Elijah Oliver, Elijah Oliver ([id number]) [id number] Page 5 of 9 Wound Treatment Wound #1 - Calcaneus Wound Laterality: Right, Medial Cleanser: Byram Ancillary Kit - 15 Day Supply (Generic) 3 x Per Week/15 Days Discharge Instructions: Use supplies as instructed; Kit contains: (15) Saline Bullets; (15) 3x3 Gauze; 15 pr Gloves Cleanser: Wound  Cleanser 3 x Per Week/15 Days Discharge Instructions: Wash your hands with soap and water. Remove old dressing, discard into plastic bag and place into trash. Cleanse the wound with Wound Cleanser prior to applying a clean dressing using gauze sponges, not tissues or cotton balls. Do not scrub or use excessive force. Pat dry using gauze sponges, not tissue or cotton balls. Prim Dressing: Hydrofera Blue Ready Transfer Foam, 4x5 (in/in) 3 x Per Week/15 Days ary Discharge Instructions: Apply Hydrofera Blue Ready to wound bed as directed Secondary Dressing: (BORDER) Zetuvit Plus SILICONE BORDER Dressing 5x5 (in/in) (Generic) 3 x Per Week/15 Days Discharge Instructions: Please do not put silicone bordered dressings under wraps. Use non-bordered dressing only. Wound #2 - Calcaneus Wound Laterality: Left Cleanser: Byram Ancillary Kit - 15 Day Supply 3 x Per Week/15 Days Discharge Instructions: Use supplies as instructed; Kit contains: (15) Saline Bullets; (15) 3x3 Gauze; 15 pr Gloves Cleanser: Wound Cleanser 3 x Per Week/15 Days Discharge Instructions: Wash your hands with soap and water. Remove old dressing, discard into plastic bag and place into trash. Cleanse the wound with Wound Cleanser prior to applying a clean dressing using gauze sponges, not tissues or cotton balls. Do not scrub or use excessive force. Pat dry using gauze sponges, not tissue or cotton balls. Prim Dressing: Hydrofera Blue Ready Transfer Foam, 2.5x2.5 (in/in) 3 x Per Week/15 Days ary Discharge Instructions: Apply Hydrofera Blue Ready to wound bed as directed Secondary Dressing: (BORDER) Zetuvit Plus SILICONE BORDER Dressing 4x4 (in/in) (Generic) 3 x Per Week/15 Days Discharge Instructions: Please do not put silicone bordered dressings under wraps. Use non-bordered dressing only. Electronic Signature(s) Unsigned Entered By: Elijah Oliver on 11/02/2023  15:49:49 -------------------------------------------------------------------------------- Problem List Details Patient Name: Date of Service: Elijah Oliver RD K. 11/02/2023 2:45 PM Medical Record Number: [id number] Patient Account Number: [id number] Date of Birth/Sex: Treating RN: 11/14/1941 (82 y.o. 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 11/02/2023 2:45 PM Medical Record Number: [id number] Patient Account Number: [id number] Date of Birth/Sex: Treating RN: Jul 31, 1941 (82 y.o. Elijah Oliver Primary Care Provider: Marisue Oliver Other Clinician: Referring Provider: Treating Provider/Extender: Elijah Oliver in Treatment: 1 Subjective Chief Complaint Information obtained from Patient Bilateral Heel Ulcers History of Present Illness (HPI) Chronic/Inactive Condition: 10-22-2023 patient's ABIs on screening today were 0.93 on the left and the right and appear to be sufficient for wound healing. 10-22-2023 upon evaluation  today patient appears to be doing somewhat poorly in regard to wounds on his heels he has 1 on the left heel which is not really doing too badly and 1 on the right heel that is a little bit more significant at this point although there is a lot of callus the wound itself does not appear to be infected and in general I do not think this is too bad overall. The patient tells me he has not really been doing anything in particular from a dressing standpoint he is just been covering it with a sock primarily. Therefore we need to get some things moving as far as his wound is concerned with regard to dressings. I did advise him he really does not need to see podiatry as we can be taken care of the wounds and get this healed. 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 His most recent hemoglobin A1c was 6.8 and that was actually on 04-06-2023 11-02-2023 upon evaluation patient's wounds are showing signs of improvement. With that being said he is definitely getting being needed some sharp debridement today to clearway some of the necrotic debris. Fortunately I do not see any signs of active infection Objective Constitutional Well-nourished and well-hydrated in no acute distress. Elijah Oliver, Elijah Oliver ([id number]) [id number] Page 7 of 9 Vitals Time Taken: 3:35 PM, Height: 72 in, Weight: 214 lbs, BMI: 29, Temperature: 98.1 F, Pulse: 67 bpm, Respiratory Rate: 18 breaths/min, Blood Pressure: 131/68 mmHg. Respiratory normal breathing without difficulty. Psychiatric this patient is able to make decisions and demonstrates good insight into disease process. Alert and Oriented x 3. pleasant and cooperative. General Notes: Patient's wound bed showed signs of good granulation and epithelization currently. I do not see any evidence of worsening overall and I do believe that the patient is making good headway here towards closure which is great  news. Integumentary (Hair, Skin) Wound #1 status is Open. Original cause of wound was Blister. The date acquired was: 10/08/2023. The wound has been in treatment 1 Oliver. The wound is located on the Right,Medial Calcaneus. The wound measures 4cm length x 4.8cm width x 0.2cm depth; 15.08cm^2 area and 3.016cm^3 volume. There is Fat Layer (Subcutaneous Tissue) exposed. There is a medium amount of serosanguineous drainage noted. There is small (1-33%) red, pink granulation within the wound bed. There is a large (67-100%) amount of necrotic tissue within the wound bed including Adherent Slough. Wound #2 status is Open. Original cause of wound was Blister. The date acquired was: 10/08/2023. The wound has been in treatment 1 Oliver. The wound is located on the Left Calcaneus. The wound measures 0.5cm length x 0.3cm width x 0.1cm depth; 0.118cm^2 area and 0.012cm^3 volume. There is a medium amount of serosanguineous drainage noted. There is medium (34-66%) pink, pale granulation within the wound bed. There is a medium (34-66%) amount of necrotic tissue within the wound bed including Adherent Slough. Assessment Active Problems ICD-10 Type 2 diabetes mellitus with foot  ulcer Pressure ulcer of right heel, stage 2 Pressure ulcer of left heel, stage 2 Type 2 diabetes mellitus with diabetic autonomic (poly)neuropathy Essential (primary) hypertension Procedures Wound #1 Pre-procedure diagnosis of Wound #1 is a Diabetic Wound/Ulcer of the Lower Extremity located on the Right,Medial Calcaneus .Severity of Tissue Pre Debridement is: Fat layer exposed. There was a Excisional Skin/Subcutaneous Tissue Debridement with a total area of 15.07 sq cm performed by Elijah Derry, Oliver. With the following instrument(s): Curette to remove Viable and Non-Viable tissue/material. Material removed includes Subcutaneous Tissue and Slough and after achieving pain control using Lidocaine 4% Topical Solution. No specimens were taken. A  time out was conducted at 15:47, prior to the start of the procedure. A Moderate amount of bleeding was controlled with Pressure. The procedure was tolerated well with a pain level of 0 throughout and a pain level of 0 following the procedure. Post Debridement Measurements: 4cm length x 4.8cm width x 0.2cm depth; 3.016cm^3 volume. Character of Wound/Ulcer Post Debridement is stable. Severity of Tissue Post Debridement is: Fat layer exposed. Post procedure Diagnosis Wound #1: Same as Pre-Procedure Wound #2 Pre-procedure diagnosis of Wound #2 is a Diabetic Wound/Ulcer of the Lower Extremity located on the Left Calcaneus .Severity of Tissue Pre Debridement is: Fat layer exposed. There was a Selective/Open Wound Skin/Epidermis Debridement with a total area of 0.12 sq cm performed by Elijah Derry, Oliver. With the following instrument(s): Curette to remove Viable and Non-Viable tissue/material. Material removed includes Skin: Dermis and Skin: Epidermis and after achieving pain control using Lidocaine 4% Topical Solution. No specimens were taken. A time out was conducted at 15:47, prior to the start of the procedure. A Moderate amount of bleeding was controlled with Pressure. The procedure was tolerated well with a pain level of 0 throughout and a pain level of 0 following the procedure. Post Debridement Measurements: 0.5cm length x 0.3cm width x 0.1cm depth; 0.012cm^3 volume. Character of Wound/Ulcer Post Debridement is stable. Severity of Tissue Post Debridement is: Fat layer exposed. Post procedure Diagnosis Wound #2: Same as Pre-Procedure Plan Follow-up Appointments: Return Appointment in 2 Oliver. Bathing/ Shower/ Hygiene: May shower; gently cleanse wound with antibacterial soap, rinse and pat dry prior to dressing wounds - Recommended to use Dial original gold soap and shower on days of dressing change. No tub bath. Anesthetic (Use 'Patient Medications' Section for Anesthetic Order  Entry): Lidocaine applied to wound bed Edema Control - Orders / Instructions: Elevate, Exercise Daily and Avoid Standing for Long Periods of Time. Elevate legs to the level of the heart and pump ankles as often as possible Elevate leg(s) parallel to the floor when sitting. DO YOUR BEST to sleep in the bed at night. DO NOT sleep in your recliner. Long hours of sitting in a recliner leads to swelling of the legs and/or potential Elijah Oliver, Elijah Oliver ([id number]) [id number] Page 8 of 9 wounds on your backside. WOUND #1: - Calcaneus Wound Laterality: Right, Medial Cleanser: Byram Ancillary Kit - 15 Day Supply (Generic) 3 x Per Week/15 Days Discharge Instructions: Use supplies as instructed; Kit contains: (15) Saline Bullets; (15) 3x3 Gauze; 15 pr Gloves Cleanser: Wound Cleanser 3 x Per Week/15 Days Discharge Instructions: Wash your hands with soap and water. Remove old dressing, discard into plastic bag and place into trash. Cleanse the wound with Wound Cleanser prior to applying a clean dressing using gauze sponges, not tissues or cotton balls. Do not scrub or use excessive force. Pat dry using gauze sponges, not tissue or cotton  balls. Prim Dressing: Hydrofera Blue Ready Transfer Foam, 4x5 (in/in) 3 x Per Week/15 Days ary Discharge Instructions: Apply Hydrofera Blue Ready to wound bed as directed Secondary Dressing: (BORDER) Zetuvit Plus SILICONE BORDER Dressing 5x5 (in/in) (Generic) 3 x Per Week/15 Days Discharge Instructions: Please do not put silicone bordered dressings under wraps. Use non-bordered dressing only. WOUND #2: - Calcaneus Wound Laterality: Left Cleanser: Byram Ancillary Kit - 15 Day Supply 3 x Per Week/15 Days Discharge Instructions: Use supplies as instructed; Kit contains: (15) Saline Bullets; (15) 3x3 Gauze; 15 pr Gloves Cleanser: Wound Cleanser 3 x Per Week/15 Days Discharge Instructions: Wash your hands with soap and water. Remove old dressing,  discard into plastic bag and place into trash. Cleanse the wound with Wound Cleanser prior to applying a clean dressing using gauze sponges, not tissues or cotton balls. Do not scrub or use excessive force. Pat dry using gauze sponges, not tissue or cotton balls. Prim Dressing: Hydrofera Blue Ready Transfer Foam, 2.5x2.5 (in/in) 3 x Per Week/15 Days ary Discharge Instructions: Apply Hydrofera Blue Ready to wound bed as directed Secondary Dressing: (BORDER) Zetuvit Plus SILICONE BORDER Dressing 4x4 (in/in) (Generic) 3 x Per Week/15 Days Discharge Instructions: Please do not put silicone bordered dressings under wraps. Use non-bordered dressing only. 1. Based on what I am seeing I do feel like the patient is making really good headway here towards closure and very pleased in that regard and hopeful that he will continue to show signs of improvement. 2. I did after debridement today noted significant improvement I would recommend that we continue with the [facility] I think that is probably still the best way to go at this time. He is in agreement with that plan. We will see patient back for reevaluation in 1 week here in the clinic. If anything worsens or changes patient will contact our office for additional recommendations. Electronic Signature(s) Signed: 11/02/2023 4:53:31 PM By: Elijah Oliver Entered By: Elijah Derry on 11/02/2023 16:53:30 -------------------------------------------------------------------------------- SuperBill Details Patient Name: Date of Service: Elijah Oliver RD K. 11/02/2023 Medical Record Number: [id number] Patient Account Number: [id number] Date of Birth/Sex: Treating RN: 06/22/1941 (82 y.o. 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 I10 Essential (primary) hypertension Facility Procedures : Elijah Oliver, Elijah Oliver Code: [id number] Elijah Oliver ([id number]) [id number] ICD L8 Description: 11042 - DEB SUBQ TISSUE 20 SQ CM/< ICD-10 Diagnosis Description L89.612 Pressure ulcer of right heel, stage 2 [id number] DEBRIDE WOUND 1ST 20 SQ CM OR < -10 Diagnosis Description 9.622 Pressure ulcer of left heel,  stage 2 Modifier: [id number] Page 9 of 9 1 Quantity: 1 Physician Procedures : CPT4 Code Description Modifier 11042 11042 - WC PHYS SUBQ TISS 20 SQ CM ICD-10 Diagnosis Description L89.612 Pressure ulcer of right heel, stage 2 Quantity: 1 : [id number] - WC PHYS DEBR WO ANESTH 20 SQ CM ICD-10 Diagnosis Description L89.622 Pressure ulcer of left heel, stage 2 Quantity: 1 Electronic Signature(s) Signed: 11/02/2023 4:53:50 PM By: Elijah Oliver Entered By: Elijah Derry on 11/02/2023 16:53:47

## 2023-11-10 ENCOUNTER — Encounter: Payer: Medicare Other | Admitting: Physician Assistant

## 2023-11-10 DIAGNOSIS — E11621 Type 2 diabetes mellitus with foot ulcer: Secondary | ICD-10-CM | POA: Diagnosis not present

## 2023-11-11 NOTE — Progress Notes (Signed)
SANJIT, SCIANDRA ([id number]) [id number] Page 1 of 7 Visit Report for 11/10/2023 Chief Complaint Document Details Patient Name: Date of Service: Elijah Peers RD K. 11/10/2023 1:00 PM Medical Record Number: [id number] Patient Account Number: [id number] Date of Birth/Sex: Treating RN: 06/18/1941 (82 y.o. [name]) [name] Primary Care Provider: Marisue Ivan Other Clinician: Betha Loa Referring Provider: Treating Provider/Extender: Maxwell Caul Weeks in Treatment: 2 Information Obtained from: Patient Chief Complaint Bilateral Heel Ulcers Electronic Signature(s) Signed: 11/10/2023 1:59:31 PM By: Allen Derry PA-C Entered By: Allen Derry on 11/10/2023 13:59:31 -------------------------------------------------------------------------------- Debridement Details Patient Name: Date of Service: Elijah Peers RD K. 11/10/2023 1:00 PM Medical Record Number: [id number] Patient Account Number: [id number] Date of Birth/Sex: Treating RN: [birth year] (82 y.o. [name]) [name] Primary Care Provider: Marisue Ivan Other Clinician: Betha Loa Referring Provider: Treating Provider/Extender: Maxwell Caul Weeks in Treatment: 2 Debridement Performed for Assessment: Wound #1 Right,Medial Calcaneus Performed By: Physician Allen Derry, PA-C The following information was scribed by: Betha Loa The information was scribed for: Allen Derry Debridement Type: Debridement Severity of Tissue Pre Debridement: Fat layer exposed Level of Consciousness (Pre-procedure): Awake and Alert Pre-procedure Verification/Time Out Yes - 13:31 Taken: Start Time: 13:31 Percent of Wound Bed Debrided: 10% T Area Debrided (cm): otal 0.1 Tissue and other material debrided: Viable, Non-Viable, Callus Level: Non-Viable Tissue Debridement Description: Selective/Open Wound Instrument: Curette Bleeding: Minimum Hemostasis Achieved:  Pressure Elijah Oliver, Elijah Oliver ([id number]) [id number] Page 2 of 7 Response to Treatment: Procedure was tolerated well Level of Consciousness (Post- Awake and Alert procedure): Post Debridement Measurements of Total Wound Length: (cm) 0.7 Width: (cm) 1.8 Depth: (cm) 0.1 Volume: (cm) 0.099 Character of Wound/Ulcer Post Debridement: Stable Severity of Tissue Post Debridement: Fat layer exposed Post Procedure Diagnosis Same as Pre-procedure Electronic Signature(s) Signed: 11/10/2023 5:40:51 PM By: Betha Loa Signed: 11/10/2023 7:54:12 PM By: Allen Derry PA-C Signed: 11/11/2023 2:31:23 PM By: [name] RN Entered By: Betha Loa on 11/10/2023 13:35:04 -------------------------------------------------------------------------------- HPI Details Patient Name: Date of Service: Elijah Peers RD K. 11/10/2023 1:00 PM Medical Record Number: [id number] Patient Account Number: [id number] Date of Birth/Sex: Treating RN: February 04, 1941 (82 y.o. [name] Primary Care Provider: Marisue Ivan Other Clinician: Betha Loa Referring Provider: Treating Provider/Extender: Maxwell Caul Weeks in Treatment: 2 History of Present Illness Chronic/Inactive Conditions Condition 1: 10-22-2023 patient's ABIs on screening today were 0.93 on the left and the right and appear to be sufficient for wound healing. HPI Description: 10-22-2023 upon evaluation today patient appears to be doing somewhat poorly in regard to wounds on his heels he has 1 on the left heel which is not really doing too badly and 1 on the right heel that is a little bit more significant at this point although there is a lot of callus the wound itself does not appear to be infected and in general I do not think this is too bad overall. The patient tells me he has not really been doing anything in particular from a dressing standpoint he is just been covering it with a sock  primarily. Therefore we need to get some things moving as far as his wound is concerned with regard to dressings. I did advise him he really does not need to see podiatry as we can be taken care of the wounds and get this healed. 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 His most recent hemoglobin A1c was 6.8 and that  was actually on 04-06-2023 11-02-2023 upon evaluation patient's wounds are showing signs of improvement. With that being said he is definitely getting being needed some sharp debridement today to clearway some of the necrotic debris. Fortunately I do not see any signs of active infection 11-10-2023 upon evaluation today patient appears to be doing great currently in regard to his wound which is getting very close to complete resolution. Fortunately there does not appear to be any signs of active infection at this time which is great news and in general I do believe that making good headway here towards closure which is also excellent news. I do not see any evidence of worsening overall. No fevers, chills, nausea, vomiting, or diarrhea. Electronic Signature(s) Signed: 11/10/2023 5:47:09 PM By: Allen Derry PA-C Entered By: Allen Derry on 11/10/2023 17:47:09 Elijah Oliver ([id number]) [id number] Page 3 of 7 -------------------------------------------------------------------------------- Physical Exam Details Patient Name: Date of Service: Elijah Peers RD K. 11/10/2023 1:00 PM Medical Record Number: [id number] Patient Account Number: [id number] Date of Birth/Sex: Treating RN: December 29, 1940 (82 y.o. [name] Primary Care Provider: Marisue Ivan Other Clinician: Betha Loa Referring Provider: Treating Provider/Extender: Maxwell Caul Weeks in Treatment: 2 Constitutional Well-nourished and well-hydrated in no acute distress. Respiratory normal breathing without  difficulty. Psychiatric this patient is able to make decisions and demonstrates good insight into disease process. Alert and Oriented x 3. pleasant and cooperative. Notes Upon inspection patient's wound bed actually showed signs of good granulation epithelization at this point. Fortunately I do not see any signs of worsening at this point and I believe that the patient is making pretty good headway here towards closure which is great news. Electronic Signature(s) Signed: 11/10/2023 5:47:27 PM By: Allen Derry PA-C Entered By: Allen Derry on 11/10/2023 17:47:26 -------------------------------------------------------------------------------- Physician Orders Details Patient Name: Date of Service: Elijah Peers RD K. 11/10/2023 1:00 PM Medical Record Number: [id number] Patient Account Number: [id number] Date of Birth/Sex: Treating RN: 1941/08/21 (82 y.o. [name] Primary Care Provider: Marisue Ivan Other Clinician: Betha Loa Referring Provider: Treating Provider/Extender: Maxwell Caul Weeks in Treatment: 2 The following information was scribed by: Betha Loa The information was scribed for: Allen Derry Verbal / Phone Orders: No Diagnosis Coding Follow-up Appointments Return Appointment in 2 weeks. Bathing/ Shower/ Hygiene May shower; gently cleanse wound with antibacterial soap, rinse and pat dry prior to dressing wounds - Recommended to use Dial original gold soap and shower on days of dressing change. No tub bath. Anesthetic (Use 'Patient Medications' Section for Anesthetic Order Entry) Elijah Oliver, Elijah Oliver ([id number]) [id number] Page 4 of 7 Lidocaine applied to wound bed Edema Control - Orders / Instructions Elevate, Exercise Daily and A void Standing for Long Periods of Time. Elevate legs to the level of the heart and pump ankles as often as possible Elevate leg(s) parallel to the floor when sitting. DO YOUR BEST  to sleep in the bed at night. DO NOT sleep in your recliner. Long hours of sitting in a recliner leads to swelling of the legs and/or potential wounds on your backside. Wound Treatment Wound #1 - Calcaneus Wound Laterality: Right, Medial Cleanser: Byram Ancillary Kit - 15 Day Supply (Generic) 3 x Per Week/15 Days Discharge Instructions: Use supplies as instructed; Kit contains: (15) Saline Bullets; (15) 3x3 Gauze; 15 pr Gloves Cleanser: Wound Cleanser 3 x Per Week/15 Days Discharge Instructions: Wash your hands with soap and water. Remove old dressing, discard into plastic bag and place into trash. Cleanse  the wound with Wound Cleanser prior to applying a clean dressing using gauze sponges, not tissues or cotton balls. Do not scrub or use excessive force. Pat dry using gauze sponges, not tissue or cotton balls. Peri-Wound Care: AandD Ointment 3 x Per Week/15 Days Discharge Instructions: Apply AandD Ointment to periwound and wound before applying Hydrofera Blue Prim Dressing: Hydrofera Blue Ready Transfer Foam, 4x5 (in/in) 3 x Per Week/15 Days ary Discharge Instructions: Apply Hydrofera Blue Ready to wound bed as directed Secondary Dressing: (BORDER) Zetuvit Plus SILICONE BORDER Dressing 5x5 (in/in) (Generic) 3 x Per Week/15 Days Discharge Instructions: Please do not put silicone bordered dressings under wraps. Use non-bordered dressing only. Electronic Signature(s) Signed: 11/10/2023 5:40:51 PM By: Betha Loa Signed: 11/10/2023 7:54:12 PM By: Allen Derry PA-C Entered By: Betha Loa on 11/10/2023 13:36:34 -------------------------------------------------------------------------------- Problem List Details Patient Name: Date of Service: Elijah Peers RD K. 11/10/2023 1:00 PM Medical Record Number: [id number] Patient Account Number: [id number] Date of Birth/Sex: Treating RN: 27-Oct-1941 (82 y.o. 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 11/10/2023 1:00 PM Medical Record Number: [id number] Patient Account Number: [id number] Date of Birth/Sex: Treating RN: [birth year] (82 y.o. [name] Primary Care Provider: Marisue Ivan Other Clinician: Betha Loa Referring Provider: Treating Provider/Extender: Maxwell Caul Weeks in Treatment: 2 Subjective Chief Complaint Information obtained from Patient Bilateral Heel Ulcers History of Present Illness (HPI) Chronic/Inactive Condition: 10-22-2023 patient's ABIs on screening today were 0.93 on the left and the right and appear to be sufficient for wound healing. 10-22-2023 upon evaluation today patient appears to be doing somewhat poorly in regard to wounds on his heels he has 1 on the left heel which is not really doing too badly and 1 on the right heel that is a little bit more significant at this point although there is a lot of callus the wound itself does not appear to be infected and in general I do not think this is too bad overall.  The patient tells me he has not really been doing anything in particular from a dressing standpoint he is just been covering it with a sock primarily. Therefore we need to get some things moving as far as his wound is concerned with regard to dressings. I did advise him he really does not need to see podiatry as we can be taken care of the wounds and get this healed. 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 His most recent hemoglobin A1c was 6.8 and that was actually on 04-06-2023 11-02-2023 upon evaluation patient's wounds are showing signs of improvement. With that being said he is definitely getting being needed some sharp debridement today to clearway some of the necrotic debris. Fortunately I do not see any signs  of active infection 11-10-2023 upon evaluation today patient appears to be doing great currently in regard to his wound which is getting very close to complete resolution. Fortunately there does not appear to be any signs of active infection at this time which is great news and in general I do believe that making good headway here towards closure which is also excellent news. I do not see any evidence of worsening overall. No fevers, chills, nausea, vomiting, or diarrhea. Objective Constitutional Well-nourished and well-hydrated in no acute distress. Vitals Time Taken: 1:14 PM, Height: 72 in, Weight: 214 lbs, BMI: 29, Temperature: 97.6 F, Pulse: 53 bpm, Respiratory Rate: 18 breaths/min, Blood Pressure: 148/79 mmHg. Elijah Oliver, Elijah Oliver ([id number]) [id number] Page 6 of 7 Respiratory normal breathing without difficulty. Psychiatric this patient is able to make decisions and demonstrates good insight into disease process. Alert and Oriented x 3. pleasant and cooperative. General Notes: Upon inspection patient's wound bed actually showed signs of good granulation epithelization at this point. Fortunately I do  not see any signs of worsening at this point and I believe that the patient is making pretty good headway here towards closure which is great news. Integumentary (Hair, Skin) Wound #1 status is Open. Original cause of wound was Blister. The date acquired was: 10/08/2023. The wound has been in treatment 2 weeks. The wound is located on the Right,Medial Calcaneus. The wound measures 0.7cm length x 1.8cm width x 0.1cm depth; 0.99cm^2 area and 0.099cm^3 volume. There is Fat Layer (Subcutaneous Tissue) exposed. There is a medium amount of serosanguineous drainage noted. There is small (1-33%) red, pink granulation within the wound bed. There is a large (67-100%) amount of necrotic tissue within the wound bed including Adherent Slough. Wound #2 status is Healed - Epithelialized. Original cause of wound was Blister. The date acquired was: 10/08/2023. The wound has been in treatment 2 weeks. The wound is located on the Left Calcaneus. The wound measures 0cm length x 0cm width x 0cm depth; 0cm^2 area and 0cm^3 volume. There is a none present amount of drainage noted. There is no granulation within the wound bed. There is no necrotic tissue within the wound bed. 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 There was a Selective/Open Wound Non-Viable Tissue Debridement with a total area of 0.1 sq cm performed by Allen Derry, PA-C. With the following instrument(s): Curette to remove Viable and Non-Viable tissue/material. Material removed includes Callus. A time out was conducted at 13:31, prior to the start  of the procedure. A Minimum amount of bleeding was controlled with Pressure. The procedure was tolerated well. Post Debridement Measurements: 0.7cm length x 1.8cm width x 0.1cm depth; 0.099cm^3 volume. Character of Wound/Ulcer Post Debridement is stable. Severity of Tissue Post Debridement is: Fat layer exposed. Post procedure Diagnosis Wound #1: Same as Pre-Procedure Plan Follow-up Appointments: Return Appointment in 2 weeks. Bathing/ Shower/ Hygiene: May shower; gently cleanse wound with antibacterial soap, rinse and pat dry prior to dressing wounds - Recommended to use Dial original gold soap and shower on days of dressing change. No tub bath. Anesthetic (Use 'Patient Medications' Section for Anesthetic Order Entry): Lidocaine applied to wound bed Edema Control -  Orders / Instructions: Elevate, Exercise Daily and Avoid Standing for Long Periods of Time. Elevate legs to the level of the heart and pump ankles as often as possible Elevate leg(s) parallel to the floor when sitting. DO YOUR BEST to sleep in the bed at night. DO NOT sleep in your recliner. Long hours of sitting in a recliner leads to swelling of the legs and/or potential wounds on your backside. WOUND #1: - Calcaneus Wound Laterality: Right, Medial Cleanser: Byram Ancillary Kit - 15 Day Supply (Generic) 3 x Per Week/15 Days Discharge Instructions: Use supplies as instructed; Kit contains: (15) Saline Bullets; (15) 3x3 Gauze; 15 pr Gloves Cleanser: Wound Cleanser 3 x Per Week/15 Days Discharge Instructions: Wash your hands with soap and water. Remove old dressing, discard into plastic bag and place into trash. Cleanse the wound with Wound Cleanser prior to applying a clean dressing using gauze sponges, not tissues or cotton balls. Do not scrub or use excessive force. Pat dry using gauze sponges, not tissue or cotton balls. Peri-Wound Care: AandD Ointment 3 x Per Week/15 Days Discharge Instructions: Apply AandD Ointment to  periwound and wound before applying Hydrofera Blue Prim Dressing: Hydrofera Blue Ready Transfer Foam, 4x5 (in/in) 3 x Per Week/15 Days ary Discharge Instructions: Apply Hydrofera Blue Ready to wound bed as directed Secondary Dressing: (BORDER) Zetuvit Plus SILICONE BORDER Dressing 5x5 (in/in) (Generic) 3 x Per Week/15 Days Discharge Instructions: Please do not put silicone bordered dressings under wraps. Use non-bordered dressing only. Elijah Oliver, Elijah Oliver ([id number]) [id number] Page 7 of 7 1. Based on what I am seeing I am going to recommend that we have the patient going continue for any signs of infection or worsening. I do believe that making pretty good headway here towards closure which is good news. 2. I am also going to recommend the patient should continue with the [facility] I am using AandD ointment around the area in general and then we will put the [facility] over top of the region but AandD ointment is. 3. I am also going to recommend that he can get back into exercising though I would not want him to be too aggressive for his treadmill or anything like that he tells me he will do that. We will see patient back for reevaluation in 1 week here in the clinic. If anything worsens or changes patient will contact our office for additional recommendations. Electronic Signature(s) Signed: 11/10/2023 5:48:13 PM By: Allen Derry PA-C Entered By: Allen Derry on 11/10/2023 17:48:13 -------------------------------------------------------------------------------- SuperBill Details Patient Name: Date of Service: Elijah Peers RD K. 11/10/2023 Medical Record Number: [id number] Patient Account Number: [id number] Date of Birth/Sex: Treating RN: 08-07-1941 (82 y.o. 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

## 2023-11-11 NOTE — Progress Notes (Signed)
Elijah Oliver ([id number]) [id number] Page 1 of 9 Visit Report for 11/10/2023 Arrival Information Details Patient Name: Date of Service: Elijah Oliver RD K. 11/10/2023 1:00 PM Medical Record Number: [id number] Patient Account Number: [id number] Date of Birth/Sex: Treating RN: 02/20/1941 (82 y.o. [name]) [name] Primary Care Stryker Veasey: Marisue Ivan Other Clinician: Betha Loa Referring Nancee Brownrigg: Treating Indi Willhite/Extender: Rebecka Apley in Treatment: 2 Visit Information History Since Last Visit All ordered tests and consults were completed: No Patient Arrived: Ambulatory Added or deleted any medications: No Arrival Time: 13:09 Any new allergies or adverse reactions: No Transfer Assistance: None Had a fall or experienced change in No Patient Identification Verified: Yes activities of daily living that may affect Secondary Verification Process Completed: Yes risk of falls: Patient Requires Transmission-Based Precautions: No Signs or symptoms of abuse/neglect since last visito No Patient Has Alerts: Yes Hospitalized since last visit: No Patient Alerts: Patient on Blood Thinner Implantable device outside of the clinic excluding No type 2 diabetic cellular tissue based products placed in the center since last visit: Has Dressing in Place as Prescribed: Yes Pain Present Now: No Electronic Signature(s) Signed: 11/10/2023 5:40:51 PM By: Betha Loa Entered By: Betha Loa on 11/10/2023 13:14:07 -------------------------------------------------------------------------------- Clinic Level of Care Assessment Details Patient Name: Date of Service: Elijah Oliver RD K. 11/10/2023 1:00 PM Medical Record Number: [id number] Patient Account Number: [id number] Date of Birth/Sex: Treating RN: 1941/05/01 (82 y.o. [name]) [name] Primary Care Alejandro Adcox: Marisue Ivan Other Clinician: Betha Loa Referring  Mitsy Owen: Treating [name]/Extender: Rebecka Apley in Treatment: 2 Clinic Level of Care Assessment Items TOOL 1 Quantity Score []  - 0 Use when EandM and Procedure is performed on INITIAL visit ASSESSMENTS - Nursing Assessment / Reassessment []  - 0 General Physical Exam (combine w/ comprehensive assessment (listed just below) when performed on new pt. evals) []  - 0 Comprehensive Assessment (HX, ROS, Risk Assessments, Wounds Hx, etc.) Elijah Oliver, Elijah Oliver ([id number]) [phone].pdf Page 2 of 9 ASSESSMENTS - Wound and Skin Assessment / Reassessment []  - 0 Dermatologic / Skin Assessment (not related to wound area) ASSESSMENTS - Ostomy and/or Continence Assessment and Care []  - 0 Incontinence Assessment and Management []  - 0 Ostomy Care Assessment and Management (repouching, etc.) PROCESS - Coordination of Care []  - 0 Simple Patient / Family Education for ongoing care []  - 0 Complex (extensive) Patient / Family Education for ongoing care []  - 0 Staff obtains Chiropractor, Records, T Results / Process Orders est []  - 0 Staff telephones HHA, Nursing Homes / Clarify orders / etc []  - 0 Routine Transfer to another Facility (non-emergent condition) []  - 0 Routine Hospital Admission (non-emergent condition) []  - 0 New Admissions / Manufacturing engineer / Ordering NPWT Apligraf, etc. , []  - 0 Emergency Hospital Admission (emergent condition) PROCESS - Special Needs []  - 0 Pediatric / Minor Patient Management []  - 0 Isolation Patient Management []  - 0 Hearing / Language / Visual special needs []  - 0 Assessment of Community assistance (transportation, D/C planning, etc.) []  - 0 Additional assistance / Altered mentation []  - 0 Support Surface(s) Assessment (bed, cushion, seat, etc.) INTERVENTIONS - Miscellaneous []  - 0 External ear exam []  - 0 Patient Transfer (multiple staff / Nurse, adult / Similar devices) []  - 0 Simple Staple /  Suture removal (25 or less) []  - 0 Complex Staple / Suture removal (26 or more) []  - 0 Hypo/Hyperglycemic Management (do not check if billed separately) []  - 0 Ankle / Brachial  Index (ABI) - do not check if billed separately Has the patient been seen at the hospital within the last three years: Yes Total Score: 0 Level Of Care: ____ Electronic Signature(s) Signed: 11/10/2023 5:40:51 PM By: Betha Loa Entered By: Betha Loa on 11/10/2023 13:36:41 -------------------------------------------------------------------------------- Encounter Discharge Information Details Patient Name: Date of Service: Elijah Oliver RD K. 11/10/2023 1:00 PM Medical Record Number: [id number] Patient Account Number: [id number] Date of Birth/Sex: Treating RN: 27-Jun-1941 (82 y.o. [name] Primary Care Phylisha Dix: Marisue Ivan Other Clinician: Betha Loa Referring Makaria Poarch: Treating [name]/Extender: Cheryl Flash ([id number]) [phone].pdf Page 3 of 9 Weeks in Treatment: 2 Encounter Discharge Information Items Post Procedure Vitals Discharge Condition: Stable Temperature (F): 97.6 Ambulatory Status: Ambulatory Pulse (bpm): 53 Discharge Destination: Home Respiratory Rate (breaths/min): 18 Transportation: Private Auto Blood Pressure (mmHg): 148/79 Accompanied By: self Schedule Follow-up Appointment: Yes Clinical Summary of Care: Electronic Signature(s) Signed: 11/10/2023 5:40:51 PM By: Betha Loa Entered By: Betha Loa on 11/10/2023 13:48:09 -------------------------------------------------------------------------------- Lower Extremity Assessment Details Patient Name: Date of Service: Elijah Oliver RD K. 11/10/2023 1:00 PM Medical Record Number: [id number] Patient Account Number: [id number] Date of Birth/Sex: Treating RN: May 26, 1941 (82 y.o. [name]) [name] Primary Care Mylin Hirano: Marisue Ivan Other  Clinician: Betha Loa Referring [name]: Treating Jeronda Don/Extender: Maxwell Caul Weeks in Treatment: 2 Electronic Signature(s) Signed: 11/10/2023 5:40:51 PM By: Betha Loa Signed: 11/11/2023 2:31:23 PM By: [name] RN Entered By: Betha Loa on 11/10/2023 13:26:57 -------------------------------------------------------------------------------- Multi Wound Chart Details Patient Name: Date of Service: Elijah Oliver RD K. 11/10/2023 1:00 PM Medical Record Number: [id number] Patient Account Number: [id number] Date of Birth/Sex: Treating RN: March 13, 1941 (82 y.o. [name] Primary Care Krystalle Pilkington: Marisue Ivan Other Clinician: Betha Loa Referring Zebedee Segundo: Treating Tejuan Gholson/Extender: Maxwell Caul Weeks in Treatment: 2 Vital Signs Height(in): 72 Pulse(bpm): 53 Weight(lbs): 214 Blood Pressure(mmHg): 148/79 Body Mass Index(BMI): 29 Temperature(F): 97.6 Respiratory Rate(breaths/min): 18 [Weinel, Ellwyn K ([id number]):Photos:] [phone].pdf Page 4 of 9:1 2 N/A N/A] Right, Medial Calcaneus Left Calcaneus N/A Wound Location: Blister Blister N/A Wounding Event: Diabetic Wound/Ulcer of the Lower Diabetic Wound/Ulcer of the Lower N/A Primary Etiology: Extremity Extremity Glaucoma, Sleep Apnea, Coronary Glaucoma, Sleep Apnea, Coronary N/A Comorbid History: Artery Disease, Hypertension, Type II Artery Disease, Hypertension, Type II Diabetes, End Stage Renal Disease, Diabetes, End Stage Renal Disease, Neuropathy, Seizure Disorder Neuropathy, Seizure Disorder 10/08/2023 10/08/2023 N/A Date Acquired: 2 2 N/A Weeks of Treatment: Open Open N/A Wound Status: No No N/A Wound Recurrence: 0.7x1.8x0.1 0.1x0.1x0.1 N/A Measurements L x W x D (cm) 0.99 0.008 N/A A (cm) : rea 0.099 0.001 N/A Volume (cm) : 94.40% 96.40% N/A % Reduction in A rea: 94.40% 95.50% N/A % Reduction in Volume: Grade 2 Grade  2 N/A Classification: Medium Medium N/A Exudate A mount: Serosanguineous Serosanguineous N/A Exudate Type: red, brown red, brown N/A Exudate Color: Small (1-33%) Medium (34-66%) N/A Granulation A mount: Red, Pink Pink, Pale N/A Granulation Quality: Large (67-100%) Medium (34-66%) N/A Necrotic A mount: Fat Layer (Subcutaneous Tissue): Yes N/A N/A Exposed Structures: None None N/A Epithelialization: Treatment Notes Electronic Signature(s) Signed: 11/10/2023 5:40:51 PM By: Betha Loa Entered By: Betha Loa on 11/10/2023 13:27:02 -------------------------------------------------------------------------------- Multi-Disciplinary Care Plan Details Patient Name: Date of Service: Elijah Oliver RD K. 11/10/2023 1:00 PM Medical Record Number: [id number] Patient Account Number: [id number] Date of Birth/Sex: Treating RN: [birth year] (82 y.o. [name] Primary Care Saisha Hogue: Marisue Ivan Other Clinician: Betha Loa Referring  Deyja Sochacki: Treating Toshiba Null/Extender: Rebecka Apley in Treatment: 2 Active Inactive Necrotic Tissue Nursing Diagnoses: Impaired tissue integrity related to necrotic/devitalized tissue Knowledge deficit related to management of necrotic/devitalized tissue Goals: Elijah Oliver, Elijah Oliver ([id number]) [id number] Page 5 of 9 Necrotic/devitalized tissue will be minimized in the wound bed Date Initiated: 10/22/2023 Target Resolution Date: 12/17/2023 Goal Status: Active Patient/caregiver will verbalize understanding of reason and process for debridement of necrotic tissue Date Initiated: 10/22/2023 Date Inactivated: 10/22/2023 Target Resolution Date: 10/22/2023 Goal Status: Met Interventions: Assess patient pain level pre-, during and post procedure and prior to discharge Provide education on necrotic tissue and debridement process Treatment Activities: Apply topical anesthetic as ordered :  10/22/2023 Excisional debridement : 10/22/2023 Notes: Wound/Skin Impairment Nursing Diagnoses: Impaired tissue integrity Knowledge deficit related to ulceration/compromised skin integrity Goals: Ulcer/skin breakdown will have a volume reduction of 30% by week 4 Date Initiated: 10/22/2023 Target Resolution Date: 11/19/2023 Goal Status: Active Ulcer/skin breakdown will have a volume reduction of 50% by week 8 Date Initiated: 10/22/2023 Target Resolution Date: 12/17/2023 Goal Status: Active Ulcer/skin breakdown will have a volume reduction of 80% by week 12 Date Initiated: 10/22/2023 Target Resolution Date: 01/14/2024 Goal Status: Active Ulcer/skin breakdown will heal within 14 weeks Date Initiated: 10/22/2023 Target Resolution Date: 01/28/2024 Goal Status: Active Interventions: Assess patient/caregiver ability to obtain necessary supplies Assess patient/caregiver ability to perform ulcer/skin care regimen upon admission and as needed Assess ulceration(s) every visit Provide education on ulcer and skin care Notes: Electronic Signature(s) Signed: 11/10/2023 5:40:51 PM By: Betha Loa Signed: 11/11/2023 2:31:23 PM By: [name] RN Entered By: Betha Loa on 11/10/2023 13:46:33 -------------------------------------------------------------------------------- Pain Assessment Details Patient Name: Date of Service: Elijah Oliver RD K. 11/10/2023 1:00 PM Medical Record Number: [id number] Patient Account Number: [id number] Date of Birth/Sex: Treating RN: December 18, 1940 (82 y.o. [name] Primary Care Minsa Weddington: Marisue Ivan Other Clinician: Betha Loa Referring Jonothan Heberle: Treating Micaila Ziemba/Extender: Rebecka Apley in Treatment: 2 Active Problems Location of Pain Severity and Description of Pain Elijah Oliver, Elijah Oliver ([id number]) [id number] Page 6 of 9 Patient Has Paino No Site Locations Pain Management and  Medication Current Pain Management: Electronic Signature(s) Signed: 11/10/2023 5:40:51 PM By: Betha Loa Signed: 11/11/2023 2:31:23 PM By: [name] RN Entered By: Betha Loa on 11/10/2023 13:19:06 -------------------------------------------------------------------------------- Patient/Caregiver Education Details Patient Name: Date of Service: Elijah Oliver RD K. 12/11/2024andnbsp1:00 PM Medical Record Number: [id number] Patient Account Number: [id number] Date of Birth/Gender: Treating RN: [birth year] (82 y.o. [name] Primary Care Physician: Marisue Ivan Other Clinician: Betha Loa Referring Physician: Treating Physician/Extender: Rebecka Apley in Treatment: 2 Education Assessment Education Provided To: Patient Education Topics Provided Wound/Skin Impairment: Handouts: Other: continue wound care as directed Methods: Explain/Verbal Responses: State content correctly Electronic Signature(s) Signed: 11/10/2023 5:40:51 PM By: Betha Loa Entered By: Betha Loa on 11/10/2023 13:47:02 Elijah Oliver ([id number]) [id number] Page 7 of 9 -------------------------------------------------------------------------------- Wound Assessment Details Patient Name: Date of Service: Elijah Oliver RD K. 11/10/2023 1:00 PM Medical Record Number: [id number] Patient Account Number: [id number] Date of Birth/Sex: Treating RN: May 15, 1941 (82 y.o. [name]) [name] Primary Care Ruthanne Mcneish: Marisue Ivan Other Clinician: Betha Loa Referring Pleas Carneal: Treating Camaryn Lumbert/Extender: Maxwell Caul Weeks in Treatment: 2 Wound Status Wound Number: 1 Primary Diabetic Wound/Ulcer of the Lower Extremity Etiology: Wound Location: Right, Medial Calcaneus Wound Open Wounding Event: Blister Status: Date Acquired: 10/08/2023 Comorbid Glaucoma, Sleep Apnea, Coronary Artery Disease, Hypertension, Weeks  Of Treatment: 2 History: Type  II Diabetes, End Stage Renal Disease, Neuropathy, Seizure Clustered Wound: No Disorder Photos Wound Measurements Length: (cm) 0.7 Width: (cm) 1.8 Depth: (cm) 0.1 Area: (cm) 0.99 Volume: (cm) 0.099 % Reduction in Area: 94.4% % Reduction in Volume: 94.4% Epithelialization: None Wound Description Classification: Grade 2 Exudate Amount: Medium Exudate Type: Serosanguineous Exudate Color: red, brown Foul Odor After Cleansing: No Slough/Fibrino Yes Wound Bed Granulation Amount: Small (1-33%) Exposed Structure Granulation Quality: Red, Pink Fat Layer (Subcutaneous Tissue) Exposed: Yes Necrotic Amount: Large (67-100%) Necrotic Quality: Adherent Slough Treatment Notes Wound #1 (Calcaneus) Wound Laterality: Right, Medial Cleanser [employer] Ancillary Kit - 15 Day Supply Discharge Instruction: Use supplies as instructed; Kit contains: (15) Saline Bullets; (15) 3x3 Gauze; 15 pr Gloves Wound Cleanser Discharge Instruction: Wash your hands with soap and water. Remove old dressing, discard into plastic bag and place into trash. Cleanse the wound with Wound Cleanser prior to applying a clean dressing using gauze sponges, not tissues or cotton balls. Do not scrub or use excessive force. Pat dry using gauze sponges, not tissue or cotton balls. Elijah Oliver, Elijah Oliver ([id number]) [id number] Page 8 of 9 Peri-Wound Care AandD Ointment Discharge Instruction: Apply AandD Ointment to periwound and wound before applying Hydrofera Blue Topical Primary Dressing Hydrofera Blue Ready Transfer Foam, 4x5 (in/in) Discharge Instruction: Apply Hydrofera Blue Ready to wound bed as directed Secondary Dressing (BORDER) Zetuvit Plus SILICONE BORDER Dressing 5x5 (in/in) Discharge Instruction: Please do not put silicone bordered dressings under wraps. Use non-bordered dressing only. Secured With Compression Wrap Compression Stockings Add-Ons Electronic  Signature(s) Signed: 11/10/2023 5:40:51 PM By: Betha Loa Signed: 11/11/2023 2:31:23 PM By: [name] RN Entered By: Betha Loa on 11/10/2023 13:26:42 -------------------------------------------------------------------------------- Wound Assessment Details Patient Name: Date of Service: Elijah Oliver RD K. 11/10/2023 1:00 PM Medical Record Number: [id number] Patient Account Number: [id number] Date of Birth/Sex: Treating RN: 1941/05/21 (82 y.o. [name]) [name] Primary Care Yoni Lobos: Marisue Ivan Other Clinician: Betha Loa Referring Nyaire Denbleyker: Treating Kaheem Halleck/Extender: Maxwell Caul Weeks in Treatment: 2 Wound Status Wound Number: 2 Primary Diabetic Wound/Ulcer of the Lower Extremity Etiology: Wound Location: Left Calcaneus Wound Healed - Epithelialized Wounding Event: Blister Status: Date Acquired: 10/08/2023 Comorbid Glaucoma, Sleep Apnea, Coronary Artery Disease, Hypertension, Weeks Of Treatment: 2 History: Type II Diabetes, End Stage Renal Disease, Neuropathy, Seizure Clustered Wound: No Disorder Photos Wound Measurements Length: (cm) Width: (cm) Elijah Oliver, Elijah Oliver ([id number]) Depth: (cm) Area: (cm) Volume: (cm) 0 % Reduction in Area: 100% 0 % Reduction in Volume: 100% [id number] Page 9 of 9 0 Epithelialization: Large (67-100%) 0 0 Wound Description Classification: Grade 2 Exudate Amount: None Present Foul Odor After Cleansing: No Slough/Fibrino No Wound Bed Granulation Amount: None Present (0%) Necrotic Amount: None Present (0%) Treatment Notes Wound #2 (Calcaneus) Wound Laterality: Left Cleanser Peri-Wound Care Topical Primary Dressing Secondary Dressing Secured With Compression Wrap Compression Stockings Add-Ons Electronic Signature(s) Signed: 11/10/2023 5:40:51 PM By: Betha Loa Signed: 11/11/2023 2:31:23 PM By: [name] RN Entered By: Betha Loa on 11/10/2023  13:31:03 -------------------------------------------------------------------------------- Vitals Details Patient Name: Date of Service: Elijah Oliver RD K. 11/10/2023 1:00 PM Medical Record Number: [id number] Patient Account Number: [id number] Date of Birth/Sex: Treating RN: 01-Oct-1941 (82 y.o. [name]) Jettie Pagan, Lyla Son Primary Care Aymar Whitfill: Marisue Ivan Other Clinician: Betha Loa Referring Anabia Weatherwax: Treating Sharbel Sahagun/Extender: Rebecka Apley in Treatment: 2 Vital Signs Time Taken: 13:14 Temperature (F): 97.6 Height (in): 72 Pulse (bpm): 53 Weight (lbs): 214 Respiratory Rate (breaths/min): 18 Body Mass Index (BMI): 29 Blood Pressure (  mmHg): 148/79 Reference Range: 80 - 120 mg / dl Electronic Signature(s) Signed: 11/10/2023 5:40:51 PM By: Betha Loa Entered By: Betha Loa on 11/10/2023 13:19:03

## 2023-11-17 ENCOUNTER — Encounter: Payer: Medicare Other | Admitting: Physician Assistant

## 2023-11-17 DIAGNOSIS — E11621 Type 2 diabetes mellitus with foot ulcer: Secondary | ICD-10-CM | POA: Diagnosis not present

## 2023-11-17 NOTE — Progress Notes (Addendum)
ERO, DEVENEY ([id number]) [id number] Page 1 of 8 Visit Report for 11/17/2023 Chief Complaint Document Details Patient Name: Date of Service: Elijah Peers RD K. 11/17/2023 12:45 PM Medical Record Number: [id number] Patient Account Number: [id number] Date of Birth/Sex: Treating RN: [birth year] (82 y.o. Elijah Oliver) Elijah Oliver Primary Care Provider: Marisue Ivan Other Clinician: Referring Provider: Treating Provider/Extender: Maxwell Caul Weeks in Treatment: 3 Information Obtained from: Patient Chief Complaint Bilateral Heel Ulcers Electronic Signature(s) Signed: 11/17/2023 1:10:48 PM By: Allen Derry PA-C Entered By: Allen Derry on 11/17/2023 10:10:48 -------------------------------------------------------------------------------- Debridement Details Patient Name: Date of Service: Elijah Peers RD K. 11/17/2023 12:45 PM Medical Record Number: [id number] Patient Account Number: [id number] Date of Birth/Sex: Treating RN: [birth year] (82 y.o. Elijah Oliver Primary Care Provider: Marisue Ivan Other Clinician: Referring Provider: Treating Provider/Extender: Maxwell Caul Weeks in Treatment: 3 Debridement Performed for Assessment: Wound #1 Right,Medial Calcaneus Performed By: Physician Allen Derry, PA-C The following information was scribed by: Elijah Oliver The information was scribed for: Allen Derry Debridement Type: Debridement Severity of Tissue Pre Debridement: Fat layer exposed Level of Consciousness (Pre-procedure): Awake and Alert Pre-procedure Verification/Time Out Yes - 13:20 Taken: Start Time: 13:20 Percent of Wound Bed Debrided: 100% T Area Debrided (cm): otal 1.1 Tissue and other material debrided: Viable, Non-Viable, Callus, Skin: Dermis , Skin: Epidermis Level: Skin/Epidermis Debridement Description: Selective/Open Wound Instrument: Curette Bleeding: None End Time: 13:28 Elijah Oliver, Elijah Oliver  ([id number]) [phone].pdf Page 2 of 8 Procedural Pain: 0 Post Procedural Pain: 0 Response to Treatment: Procedure was tolerated well Level of Consciousness (Post- Awake and Alert procedure): Post Debridement Measurements of Total Wound Length: (cm) 0.7 Width: (cm) 2 Depth: (cm) 0.1 Volume: (cm) 0.11 Character of Wound/Ulcer Post Debridement: Improved Severity of Tissue Post Debridement: Fat layer exposed Post Procedure Diagnosis Same as Pre-procedure Electronic Signature(s) Signed: 11/19/2023 11:21:11 AM By: Elijah Pax RN Signed: 11/19/2023 12:16:54 PM By: Allen Derry PA-C Entered By: Elijah Oliver on 11/17/2023 10:26:36 -------------------------------------------------------------------------------- HPI Details Patient Name: Date of Service: Elijah Peers RD K. 11/17/2023 12:45 PM Medical Record Number: [id number] Patient Account Number: [id number] Date of Birth/Sex: Treating RN: October 16, 1941 (82 y.o. Elijah Oliver Primary Care Provider: Marisue Ivan Other Clinician: Referring Provider: Treating Provider/Extender: Maxwell Caul Weeks in Treatment: 3 History of Present Illness Chronic/Inactive Conditions Condition 1: 10-22-2023 patient's ABIs on screening today were 0.93 on the left and the right and appear to be sufficient for wound healing. HPI Description: 10-22-2023 upon evaluation today patient appears to be doing somewhat poorly in regard to wounds on his heels he has 1 on the left heel which is not really doing too badly and 1 on the right heel that is a little bit more significant at this point although there is a lot of callus the wound itself does not appear to be infected and in general I do not think this is too bad overall. The patient tells me he has not really been doing anything in particular from a dressing standpoint he is just been covering it with a sock primarily. Therefore we need to get some things moving as  far as his wound is concerned with regard to dressings. I did advise him he really does not need to see podiatry as we can be taken care of the wounds and get this healed. 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 His most recent hemoglobin A1c was 6.8 and that was actually  on 04-06-2023 11-02-2023 upon evaluation patient's wounds are showing signs of improvement. With that being said he is definitely getting being needed some sharp debridement today to clearway some of the necrotic debris. Fortunately I do not see any signs of active infection 11-10-2023 upon evaluation today patient appears to be doing great currently in regard to his wound which is getting very close to complete resolution. Fortunately there does not appear to be any signs of active infection at this time which is great news and in general I do believe that making good headway here towards closure which is also excellent news. I do not see any evidence of worsening overall. No fevers, chills, nausea, vomiting, or diarrhea. 11-17-2023 upon evaluation today patient appears to be doing well currently in regard to his heel he is pretty much just about done. Fortunately I do not see any signs of active infection at this time. I do think that he is making good headway here and I am very pleased with where things stand. I do not see any signs of active infection at this time. Electronic Signature(s) Signed: 11/17/2023 1:37:08 PM By: Allen Derry PA-C Entered By: Allen Derry on 11/17/2023 10:37:08 Elijah Oliver ([id number]) [id number] Page 3 of 8 -------------------------------------------------------------------------------- Physical Exam Details Patient Name: Date of Service: Elijah Peers RD K. 11/17/2023 12:45 PM Medical Record Number: [id number] Patient Account Number: [id number] Date of Birth/Sex: Treating RN: [birth year] (82 y.o. Elijah Oliver Primary Care Provider: Marisue Ivan Other Clinician: Referring Provider: Treating Provider/Extender: Maxwell Caul Weeks in Treatment: 3 Constitutional Well-nourished and well-hydrated in no acute distress. Respiratory normal breathing without difficulty. Psychiatric this patient is able to make decisions and demonstrates good insight into disease process. Alert and Oriented x 3. pleasant and cooperative. Notes Upon inspection patient's wound bed actually showed signs of good granulation and epithelization at this point. Fortunately I do not see any signs of worsening overall and I do believe that the patient is making really good headway here towards getting this closed in fact I think is probably within 2 weeks of complete closure. Electronic Signature(s) Signed: 11/17/2023 1:37:24 PM By: Allen Derry PA-C Entered By: Allen Derry on 11/17/2023 10:37:24 -------------------------------------------------------------------------------- Physician Orders Details Patient Name: Date of Service: Elijah Peers RD K. 11/17/2023 12:45 PM Medical Record Number: [id number] Patient Account Number: [id number] Date of Birth/Sex: Treating RN: [birth year] (82 y.o. 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 Bathing/ Shower/ Hygiene May shower; gently cleanse wound with antibacterial soap, rinse and pat dry prior to dressing wounds - Recommended to use Dial original gold soap and shower on days of dressing change. No tub bath. Anesthetic (Use 'Patient Medications' Section for Anesthetic Order Entry) Lidocaine applied to wound bed Edema Control - Orders / Instructions Elevate, Exercise Daily and A void Standing for Long Periods of Time. Elevate legs to the level of the heart and pump ankles as often as possible Elevate  leg(s) parallel to the floor when sitting. DO YOUR BEST to sleep in the bed at night. DO NOT sleep in your recliner. Long hours of sitting in a recliner leads to swelling of the legs and/or potential wounds on your backside. Wound Treatment Wound #1 - Calcaneus Wound Laterality: Right, Medial Cleanser: Byram Ancillary Kit - 15 Day Supply (Generic) 3 x Per Week/15 Days Discharge Instructions: Use supplies as instructed; Kit contains: (15) Saline Bullets; (15) 3x3 Gauze; 15 pr Gloves Cleanser: Wound Cleanser 3 x Per Week/15 Days Discharge Instructions: Wash your hands with soap and water. Remove old dressing, discard into plastic bag and place into trash. Cleanse the wound with Wound Cleanser prior to applying a clean dressing using gauze sponges, not tissues or cotton balls. Do not scrub or use excessive force. Pat dry using gauze sponges, not tissue or cotton balls. Peri-Wound Care: AandD Ointment 3 x Per Week/15 Days Discharge Instructions: Apply AandD Ointment to periwound and wound before applying Hydrofera Blue Prim Dressing: Hydrofera Blue Ready Transfer Foam, 4x5 (in/in) 3 x Per Week/15 Days ary Discharge Instructions: Apply Hydrofera Blue Ready to wound bed as directed Secondary Dressing: (BORDER) Zetuvit Plus SILICONE BORDER Dressing 5x5 (in/in) (Generic) 3 x Per Week/15 Days Discharge Instructions: Please do not put silicone bordered dressings  under wraps. Use non-bordered dressing only. Electronic Signature(s) Signed: 11/19/2023 11:21:11 AM By: Elijah Pax RN Signed: 11/19/2023 12:16:54 PM By: Allen Derry PA-C Entered By: Elijah Oliver on 11/17/2023 10:24:24 -------------------------------------------------------------------------------- Problem List Details Patient Name: Date of Service: Elijah Peers RD K. 11/17/2023 12:45 PM Medical Record Number: [id number] Patient Account Number: [id number] Date of Birth/Sex: Treating RN: 1941-11-08 (82 y.o. 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 11/17/2023 12:45 PM Medical Record Number: [id number] Patient Account Number: [id number] Date of Birth/Sex: Treating RN: May 29, 1941 (82 y.o. Elijah Oliver Primary Care Provider: Marisue Ivan Other Clinician: Referring Provider: Treating Provider/Extender: Maxwell Caul Weeks in Treatment: 3 Subjective Chief Complaint Information obtained from Patient Bilateral Heel  Ulcers History of Present Illness (HPI) Chronic/Inactive Condition: 10-22-2023 patient's ABIs on screening today were 0.93 on the left and the right and appear to be sufficient for wound healing. 10-22-2023 upon evaluation today patient appears to be doing somewhat poorly in regard to wounds on his heels he has 1 on the left heel which is not really doing too badly and 1 on the right heel that is a little bit more significant at this point although there is a lot of callus the wound itself does not appear to be infected and in general I do not think this is too bad overall. The patient tells me he has not really been doing anything in particular from a dressing standpoint he is just been covering it with a sock primarily.  Therefore we need to get some things moving as far as his wound is concerned with regard to dressings. I did advise him he really does not need to see podiatry as we can be taken care of the wounds and get this healed. 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 His most recent hemoglobin A1c was 6.8 and that was actually on 04-06-2023 11-02-2023 upon evaluation patient's wounds are showing signs of improvement. With that being said he is definitely getting being needed some sharp debridement today to clearway some of the necrotic debris. Fortunately I do not see any signs of active infection 11-10-2023 upon evaluation today patient appears to be doing great currently in regard to his wound which is getting very close to complete resolution. Fortunately there does not appear to be any signs of active infection at this time which is great news and in general I do believe that making good headway here towards closure which is also excellent news. I do not see any evidence of worsening overall. No fevers, chills, nausea, vomiting, or diarrhea. 11-17-2023 upon evaluation today patient appears to be doing well currently in regard to his  heel he is pretty much just about done. Fortunately I do not see any signs of active infection at this time. I do think that he is making good headway here and I am very pleased with where things stand. I do not see any signs of active infection at this time. Elijah Oliver, Elijah Oliver ([id number]) [id number] Page 6 of 8 Objective Constitutional Well-nourished and well-hydrated in no acute distress. Vitals Time Taken: 12:51 PM, Height: 72 in, Weight: 214 lbs, BMI: 29, Temperature: 97.9 F, Pulse: 65 bpm, Respiratory Rate: 18 breaths/min, Blood Pressure: 169/61 mmHg. Respiratory normal breathing without difficulty. Psychiatric this patient is able to make decisions and demonstrates good insight into disease process. Alert and Oriented x 3. pleasant and cooperative. General Notes: Upon inspection patient's wound bed actually showed signs of good granulation and epithelization at this point. Fortunately I do not see any signs of worsening overall and I do believe that the patient is making really good headway here towards getting this closed in fact I think is probably within 2 weeks of complete closure. Integumentary (Hair, Skin) Wound #1 status is Open. Original cause of wound was Blister. The date acquired was: 10/08/2023. The wound has been in treatment 3 weeks. The wound is located on the Right,Medial Calcaneus. The wound measures 0.7cm length x 2cm width x 0.1cm depth; 1.1cm^2 area and 0.11cm^3 volume. There is Fat Layer (Subcutaneous Tissue) exposed. There is no tunneling or undermining noted. There is a medium amount of serosanguineous drainage noted. There is small (1- 33%) red, pink granulation within the wound bed. There is a large (67-100%) amount of necrotic tissue within the wound bed including Adherent Slough. Assessment Active Problems ICD-10 Type 2 diabetes mellitus with foot ulcer Pressure ulcer of right heel, stage 2 Pressure ulcer of left heel, stage  2 Type 2 diabetes mellitus with diabetic autonomic (poly)neuropathy Essential (primary) hypertension Procedures Wound #1 Pre-procedure diagnosis of Wound #1 is a Diabetic Wound/Ulcer of the Lower Extremity located on the Right,Medial Calcaneus .Severity of Tissue Pre Debridement is: Fat layer exposed. There was a Selective/Open Wound Skin/Epidermis Debridement with a total area of 1.1 sq cm performed by Allen Derry, PA-C. With the following instrument(s): Curette to remove Viable and Non-Viable tissue/material. Material removed includes Callus, Skin: Dermis, and Skin: Epidermis. No specimens were taken.  A time out was conducted at 13:20, prior to the start of the procedure. There was no bleeding. The procedure was tolerated well with a pain level of 0 throughout and a pain level of 0 following the procedure. Post Debridement Measurements: 0.7cm length x 2cm width x 0.1cm depth; 0.11cm^3 volume. Character of Wound/Ulcer Post Debridement is improved. Severity of Tissue Post Debridement is: Fat layer exposed. Post procedure Diagnosis Wound #1: Same as Pre-Procedure Plan Follow-up Appointments: Return Appointment in 2 weeks. Bathing/ Shower/ Hygiene: May shower; gently cleanse wound with antibacterial soap, rinse and pat dry prior to dressing wounds - Recommended to use Dial original gold soap and shower on days of dressing change. No tub bath. Anesthetic (Use 'Patient Medications' Section for Anesthetic Order Entry): Lidocaine applied to wound bed Edema Control - Orders / Instructions: Elevate, Exercise Daily and Avoid Standing for Long Periods of Time. Elevate legs to the level of the heart and pump ankles as often as possible Elevate leg(s) parallel to the floor when sitting. DO YOUR BEST to sleep in the bed at night. DO NOT sleep in your recliner. Long hours of sitting in a recliner leads to swelling of the legs and/or potential wounds on your backside. WOUND #1: - Calcaneus Wound  Laterality: Right, Medial Cleanser: Byram Ancillary Kit - 15 Day Supply (Generic) 3 x Per [address] Elijah Oliver, Elijah Oliver ([id number]) [phone].pdf Page 7 of 8 Discharge Instructions: Use supplies as instructed; Kit contains: (15) Saline Bullets; (15) 3x3 Gauze; 15 pr Gloves Cleanser: Wound Cleanser 3 x Per Week/15 Days Discharge Instructions: Wash your hands with soap and water. Remove old dressing, discard into plastic bag and place into trash. Cleanse the wound with Wound Cleanser prior to applying a clean dressing using gauze sponges, not tissues or cotton balls. Do not scrub or use excessive force. Pat dry using gauze sponges, not tissue or cotton balls. Peri-Wound Care: AandD Ointment 3 x Per Week/15 Days Discharge Instructions: Apply AandD Ointment to periwound and wound before applying Hydrofera Blue Prim Dressing: Hydrofera Blue Ready Transfer Foam, 4x5 (in/in) 3 x Per Week/15 Days ary Discharge Instructions: Apply Hydrofera Blue Ready to wound bed as directed Secondary Dressing: (BORDER) Zetuvit Plus SILICONE BORDER Dressing 5x5 (in/in) (Generic) 3 x Per Week/15 Days Discharge Instructions: Please do not put silicone bordered dressings under wraps. Use non-bordered dressing only. 1. I would recommend that he should continue to monitor for any signs of infection or worsening. Based on what I am seeing I am going to recommend that he continue with the [facility] for the right heel I am hoping it will be healed by the next time he is in. 2. Also can recommend that he continue with the bordered foam dressing to cover. 3. Should continue with AandD ointment to the left heel. The soonest we get the right healed he will also be using that there until he can transition to the urea cream. We gave him the urea cream handout today. We will see patient back for reevaluation in 1 week here in the clinic. If anything worsens or changes patient will contact our office for  additional recommendations. Electronic Signature(s) Signed: 11/17/2023 1:37:55 PM By: Allen Derry PA-C Entered By: Allen Derry on 11/17/2023 10:37:55 -------------------------------------------------------------------------------- SuperBill Details Patient Name: Date of Service: Elijah Peers RD K. 11/17/2023 Medical Record Number: [id number] Patient Account Number: [id number] Date of Birth/Sex: Treating RN: 04-30-1941 (82 y.o. 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 I10 Essential (primary) hypertension Facility Procedures : CPT4 Code: [id number] Description: 97597 - DEBRIDE WOUND 1ST 20 SQ CM OR < ICD-10 Diagnosis Description L89.612 Pressure ulcer of right heel, stage 2 Modifier: Quantity: 1 Physician Procedures : CPT4 Code Description Modifier [id number] - WC PHYS DEBR WO ANESTH 20 SQ CM ICD-10 Diagnosis Description L89.612 Pressure ulcer of right heel, stage 2 Elijah Oliver, Elijah Oliver ([id number]) [id number] Page 8 of Quantity: 1 8 Electronic Signature(s) Signed: 11/17/2023 1:38:33 PM By: Allen Derry PA-C Entered By: Allen Derry on 11/17/2023 10:38:33

## 2023-11-20 NOTE — Progress Notes (Signed)
Elijah, Oliver ([id number]) [id number] Page 1 of 8 Visit Report for 11/17/2023 Arrival Information Details Patient Name: Date of Service: Elijah Oliver RD K. 11/17/2023 12:45 PM Medical Record Number: [id number] Patient Account Number: [id number] Date of Birth/Sex: Treating RN: September 14, 1941 (82 y.o. Elijah Oliver) [name] Primary Care Jaylise Peek: Marisue Ivan Other Clinician: Referring Sharnice Bosler: Treating Zephan Beauchaine/Extender: Rebecka Apley in Treatment: 3 Visit Information History Since Last Visit Added or deleted any medications: No Patient Arrived: Ambulatory Any new allergies or adverse reactions: No Arrival Time: 12:50 Had a fall or experienced change in No Accompanied By: self activities of daily living that may affect Transfer Assistance: None risk of falls: Patient Identification Verified: Yes Signs or symptoms of abuse/neglect since last visito No Secondary Verification Process Completed: Yes Hospitalized since last visit: No Patient Requires Transmission-Based Precautions: No Implantable device outside of the clinic excluding No Patient Has Alerts: Yes cellular tissue based products placed in the center Patient Alerts: Patient on Blood Thinner since last visit: type 2 diabetic Has Dressing in Place as Prescribed: Yes Pain Present Now: No Electronic Signature(s) Signed: 11/19/2023 11:21:11 AM By: [name] RN Entered By: [name] on 11/17/2023 09:51:19 -------------------------------------------------------------------------------- Clinic Level of Care Assessment Details Patient Name: Date of Service: Elijah Oliver RD K. 11/17/2023 12:45 PM Medical Record Number: [id number] Patient Account Number: [id number] Date of Birth/Sex: Treating RN: 1940/12/09 (82 y.o. Elijah Oliver) [name] Primary Care Ursula Dermody: Marisue Ivan Other Clinician: Referring Elijah Oliver: Treating Earnesteen Birnie/Extender: Maxwell Caul Weeks in Treatment: 3 Clinic Level of Care Assessment Items TOOL 1 Quantity Score []  - 0 Use when EandM and Procedure is performed on INITIAL visit ASSESSMENTS - Nursing Assessment / Reassessment []  - 0 General Physical Exam (combine w/ comprehensive assessment (listed just below) when performed on new pt. evals) []  - 0 Comprehensive Assessment (HX, ROS, Risk Assessments, Wounds Hx, etc.) KEMARION, Elijah ([id number]) [phone].pdf Page 2 of 8 ASSESSMENTS - Wound and Skin Assessment / Reassessment []  - 0 Dermatologic / Skin Assessment (not related to wound area) ASSESSMENTS - Ostomy and/or Continence Assessment and Care []  - 0 Incontinence Assessment and Management []  - 0 Ostomy Care Assessment and Management (repouching, etc.) PROCESS - Coordination of Care []  - 0 Simple Patient / Family Education for ongoing care []  - 0 Complex (extensive) Patient / Family Education for ongoing care []  - 0 Staff obtains Chiropractor, Records, T Results / Process Orders est []  - 0 Staff telephones HHA, Nursing Homes / Clarify orders / etc []  - 0 Routine Transfer to another Facility (non-emergent condition) []  - 0 Routine Hospital Admission (non-emergent condition) []  - 0 New Admissions / Manufacturing engineer / Ordering NPWT Apligraf, etc. , []  - 0 Emergency Hospital Admission (emergent condition) PROCESS - Special Needs []  - 0 Pediatric / Minor Patient Management []  - 0 Isolation Patient Management []  - 0 Hearing / Language / Visual special needs []  - 0 Assessment of Community assistance (transportation, D/C planning, etc.) []  - 0 Additional assistance / Altered mentation []  - 0 Support Surface(s) Assessment (bed, cushion, seat, etc.) INTERVENTIONS - Miscellaneous []  - 0 External ear exam []  - 0 Patient Transfer (multiple staff / Nurse, adult / Similar devices) []  - 0 Simple Staple / Suture removal (25 or less) []  - 0 Complex Staple / Suture  removal (26 or more) []  - 0 Hypo/Hyperglycemic Management (do not check if billed separately) []  - 0 Ankle / Brachial Index (ABI) - do not check if billed  separately Has the patient been seen at the hospital within the last three years: Yes Total Score: 0 Level Of Care: ____ Electronic Signature(s) Signed: 11/19/2023 11:21:11 AM By: [name] RN Entered By: [name] on 11/17/2023 10:26:47 -------------------------------------------------------------------------------- Encounter Discharge Information Details Patient Name: Date of Service: Elijah Oliver RD K. 11/17/2023 12:45 PM Medical Record Number: [id number] Patient Account Number: [id number] Date of Birth/Sex: Treating RN: [birth year] (82 y.o. Elijah Oliver Primary Care Issis Lindseth: Marisue Ivan Other Clinician: Referring Tavia Stave: Treating Paymon Rosensteel/Extender: Rebecka Apley in Treatment: [address] [place], [place] ([id number]) [id number] Page 3 of 8 Encounter Discharge Information Items Post Procedure Vitals Discharge Condition: Stable Temperature (F): 97.9 Ambulatory Status: Ambulatory Pulse (bpm): 65 Discharge Destination: Home Respiratory Rate (breaths/min): 18 Transportation: Private Auto Blood Pressure (mmHg): 169/61 Accompanied By: self Schedule Follow-up Appointment: Yes Clinical Summary of Care: Electronic Signature(s) Signed: 11/19/2023 11:21:11 AM By: [name] RN Entered By: [name] on 11/17/2023 10:27:59 -------------------------------------------------------------------------------- Lower Extremity Assessment Details Patient Name: Date of Service: Elijah Oliver RD K. 11/17/2023 12:45 PM Medical Record Number: [id number] Patient Account Number: [id number] Date of Birth/Sex: Treating RN: 1941-04-05 (82 y.o. Elijah Oliver Primary Care Travus Oren: Marisue Ivan Other Clinician: Referring Naylin Burkle: Treating Shanette Tamargo/Extender: Maxwell Caul Weeks in Treatment: 3 Electronic Signature(s) Signed: 11/19/2023 11:21:11 AM By: [name] RN Entered By: [name] on 11/17/2023 09:55:45 -------------------------------------------------------------------------------- Multi Wound Chart Details Patient Name: Date of Service: Elijah Oliver RD K. 11/17/2023 12:45 PM Medical Record Number: [id number] Patient Account Number: [id number] Date of Birth/Sex: Treating RN: 1941-03-23 (82 y.o. Elijah Oliver Primary Care Carissa Musick: Marisue Ivan Other Clinician: Referring Rivaldo Hineman: Treating Jatia Musa/Extender: Maxwell Caul Weeks in Treatment: 3 Vital Signs Height(in): 72 Pulse(bpm): 65 Weight(lbs): 214 Blood Pressure(mmHg): 169/61 Body Mass Index(BMI): 29 Temperature(F): 97.9 Respiratory Rate(breaths/min): 18 [1:Photos:] DOREEN, BALCERZAK ([id number]) [1:Photos:] [N/A:N/A] Right, Medial Calcaneus N/A N/A Wound Location: Blister N/A N/A Wounding Event: Diabetic Wound/Ulcer of the Lower N/A N/A Primary Etiology: Extremity Glaucoma, Sleep Apnea, Coronary N/A N/A Comorbid History: Artery Disease, Hypertension, Type II Diabetes, End Stage Renal Disease, Neuropathy, Seizure Disorder 10/08/2023 N/A N/A Date Acquired: 3 N/A N/A Weeks of Treatment: Open N/A N/A Wound Status: No N/A N/A Wound Recurrence: 0.7x2x0.1 N/A N/A Measurements L x W x D (cm) 1.1 N/A N/A A (cm) : rea 0.11 N/A N/A Volume (cm) : 93.80% N/A N/A % Reduction in A rea: 93.80% N/A N/A % Reduction in Volume: Grade 2 N/A N/A Classification: Medium N/A N/A Exudate A mount: Serosanguineous N/A N/A Exudate Type: red, brown N/A N/A Exudate Color: Small (1-33%) N/A N/A Granulation A mount: Red, Pink N/A N/A Granulation Quality: Large (67-100%) N/A N/A Necrotic A mount: Fat Layer (Subcutaneous Tissue): Yes N/A N/A Exposed Structures: None N/A N/A Epithelialization: Treatment Notes Electronic  Signature(s) Signed: 11/19/2023 11:21:11 AM By: [name] RN Entered By: [name] on 11/17/2023 09:55:49 -------------------------------------------------------------------------------- Multi-Disciplinary Care Plan Details Patient Name: Date of Service: Elijah Oliver RD K. 11/17/2023 12:45 PM Medical Record Number: [id number] Patient Account Number: [id number] Date of Birth/Sex: Treating RN: Sep 22, 1941 (82 y.o. Elijah Oliver Primary Care Totiana Everson: Marisue Ivan Other Clinician: Referring Gurneet Matarese: Treating Maximina Pirozzi/Extender: Maxwell Caul Weeks in Treatment: 3 Active Inactive Necrotic Tissue Nursing Diagnoses: Impaired tissue integrity related to necrotic/devitalized tissue Knowledge deficit related to management of necrotic/devitalized tissue Goals: Necrotic/devitalized tissue will be minimized in the wound bed OREL, WHITMOYER ([id number]) [phone].pdf Page 5 of 8 Date  Initiated: 10/22/2023 Target Resolution Date: 12/17/2023 Goal Status: Active Patient/caregiver will verbalize understanding of reason and process for debridement of necrotic tissue Date Initiated: 10/22/2023 Date Inactivated: 10/22/2023 Target Resolution Date: 10/22/2023 Goal Status: Met Interventions: Assess patient pain level pre-, during and post procedure and prior to discharge Provide education on necrotic tissue and debridement process Treatment Activities: Apply topical anesthetic as ordered : 10/22/2023 Excisional debridement : 10/22/2023 Notes: Wound/Skin Impairment Nursing Diagnoses: Impaired tissue integrity Knowledge deficit related to ulceration/compromised skin integrity Goals: Ulcer/skin breakdown will have a volume reduction of 30% by week 4 Date Initiated: 10/22/2023 Target Resolution Date: 11/19/2023 Goal Status: Active Ulcer/skin breakdown will have a volume reduction of 50% by week 8 Date Initiated: 10/22/2023 Target Resolution  Date: 12/17/2023 Goal Status: Active Ulcer/skin breakdown will have a volume reduction of 80% by week 12 Date Initiated: 10/22/2023 Target Resolution Date: 01/14/2024 Goal Status: Active Ulcer/skin breakdown will heal within 14 weeks Date Initiated: 10/22/2023 Target Resolution Date: 01/28/2024 Goal Status: Active Interventions: Assess patient/caregiver ability to obtain necessary supplies Assess patient/caregiver ability to perform ulcer/skin care regimen upon admission and as needed Assess ulceration(s) every visit Provide education on ulcer and skin care Notes: Electronic Signature(s) Signed: 11/19/2023 11:21:11 AM By: [name] RN Entered By: [name] on 11/17/2023 09:55:55 -------------------------------------------------------------------------------- Pain Assessment Details Patient Name: Date of Service: Elijah Oliver RD K. 11/17/2023 12:45 PM Medical Record Number: [id number] Patient Account Number: [id number] Date of Birth/Sex: Treating RN: 03-14-1941 (82 y.o. Elijah Oliver Primary Care Shirle Provencal: Marisue Ivan Other Clinician: Referring Marieann Zipp: Treating Fintan Grater/Extender: Rebecka Apley in Treatment: 3 Active Problems Location of Pain Severity and Description of Pain Patient Has Paino No Site Locations [place], [place] ([id number]) [phone].pdf Page 6 of 8 Pain Management and Medication Current Pain Management: Electronic Signature(s) Signed: 11/19/2023 11:21:11 AM By: [name] RN Entered By: [name] on 11/17/2023 09:52:14 -------------------------------------------------------------------------------- Patient/Caregiver Education Details Patient Name: Date of Service: Elijah Oliver RD K. 12/18/2024andnbsp12:45 PM Medical Record Number: [id number] Patient Account Number: [id number] Date of Birth/Gender: Treating RN: Nov 02, 1941 (82 y.o. Elijah Oliver Primary Care Physician: Marisue Ivan  Other Clinician: Referring Physician: Treating Physician/Extender: Rebecka Apley in Treatment: 3 Education Assessment Education Provided To: Patient Education Topics Provided Wound Debridement: Handouts: Wound Debridement Methods: Explain/Verbal Responses: State content correctly Electronic Signature(s) Signed: 11/19/2023 11:21:11 AM By: [name] RN Entered By: [name] on 11/17/2023 09:56:06 Durene Cal ([id number]) [id number] Page 7 of 8 -------------------------------------------------------------------------------- Wound Assessment Details Patient Name: Date of Service: Elijah Oliver RD K. 11/17/2023 12:45 PM Medical Record Number: [id number] Patient Account Number: [id number] Date of Birth/Sex: Treating RN: June 09, 1941 (82 y.o. Elijah Oliver) [name] Primary Care Jakeria Caissie: Marisue Ivan Other Clinician: Referring Dakin Madani: Treating Lashay Osborne/Extender: Maxwell Caul Weeks in Treatment: 3 Wound Status Wound Number: 1 Primary Diabetic Wound/Ulcer of the Lower Extremity Etiology: Wound Location: Right, Medial Calcaneus Wound Open Wounding Event: Blister Status: Date Acquired: 10/08/2023 Comorbid Glaucoma, Sleep Apnea, Coronary Artery Disease, Hypertension, Weeks Of Treatment: 3 History: Type II Diabetes, End Stage Renal Disease, Neuropathy, Seizure Clustered Wound: No Disorder Photos Wound Measurements Length: (cm) 0.7 Width: (cm) 2 Depth: (cm) 0.1 Area: (cm) 1.1 Volume: (cm) 0.11 % Reduction in Area: 93.8% % Reduction in Volume: 93.8% Epithelialization: None Tunneling: No Undermining: No Wound Description Classification: Grade 2 Exudate Amount: Medium Exudate Type: Serosanguineous Exudate Color: red, brown Foul Odor After Cleansing: No Slough/Fibrino Yes Wound Bed Granulation Amount: Small (1-33%) Exposed Structure  Granulation Quality: Red, Pink Fat Layer (Subcutaneous Tissue)  Exposed: Yes Necrotic Amount: Large (67-100%) Necrotic Quality: Adherent Slough Treatment Notes Wound #1 (Calcaneus) Wound Laterality: Right, Medial Cleanser [place] Ancillary Kit - 15 Day Supply Discharge Instruction: Use supplies as instructed; Kit contains: (15) Saline Bullets; (15) 3x3 Gauze; 15 pr Gloves Wound Cleanser Discharge Instruction: Wash your hands with soap and water. Remove old dressing, discard into plastic bag and place into trash. Cleanse the wound with Wound Cleanser prior to applying a clean dressing using gauze sponges, not tissues or cotton balls. Do not scrub or use excessive force. Pat dry using gauze sponges, not tissue or cotton balls. VIRDEN, MCLAFFERTY ([id number]) [id number] Page 8 of 8 Peri-Wound Care AandD Ointment Discharge Instruction: Apply AandD Ointment to periwound and wound before applying Hydrofera Blue Topical Primary Dressing Hydrofera Blue Ready Transfer Foam, 4x5 (in/in) Discharge Instruction: Apply Hydrofera Blue Ready to wound bed as directed Secondary Dressing (BORDER) Zetuvit Plus SILICONE BORDER Dressing 5x5 (in/in) Discharge Instruction: Please do not put silicone bordered dressings under wraps. Use non-bordered dressing only. Secured With Compression Wrap Compression Stockings Facilities manager) Signed: 11/19/2023 11:21:11 AM By: [name] RN Entered By: [name] on 11/17/2023 09:55:33 -------------------------------------------------------------------------------- Vitals Details Patient Name: Date of Service: Elijah Oliver RD K. 11/17/2023 12:45 PM Medical Record Number: [id number] Patient Account Number: [id number] Date of Birth/Sex: Treating RN: 1941/07/04 (82 y.o. Elijah Oliver) [name] Primary Care Paeton Studer: Marisue Ivan Other Clinician: Referring Mima Cranmore: Treating Euclide Granito/Extender: Rebecka Apley in Treatment: 3 Vital Signs Time Taken: 12:51 Temperature  (F): 97.9 Height (in): 72 Pulse (bpm): 65 Weight (lbs): 214 Respiratory Rate (breaths/min): 18 Body Mass Index (BMI): 29 Blood Pressure (mmHg): 169/61 Reference Range: 80 - 120 mg / dl Electronic Signature(s) Signed: 11/19/2023 11:21:11 AM By: [name] RN Entered By: [name] on 11/17/2023 09:52:08

## 2023-12-02 ENCOUNTER — Ambulatory Visit: Payer: Medicare Other | Admitting: Physician Assistant

## 2023-12-02 ENCOUNTER — Encounter: Payer: Medicare Other | Attending: Physician Assistant | Admitting: Physician Assistant

## 2023-12-02 DIAGNOSIS — L89612 Pressure ulcer of right heel, stage 2: Secondary | ICD-10-CM | POA: Diagnosis not present

## 2023-12-02 DIAGNOSIS — G40909 Epilepsy, unspecified, not intractable, without status epilepticus: Secondary | ICD-10-CM | POA: Diagnosis not present

## 2023-12-02 DIAGNOSIS — H409 Unspecified glaucoma: Secondary | ICD-10-CM | POA: Insufficient documentation

## 2023-12-02 DIAGNOSIS — L89622 Pressure ulcer of left heel, stage 2: Secondary | ICD-10-CM | POA: Diagnosis not present

## 2023-12-02 DIAGNOSIS — I120 Hypertensive chronic kidney disease with stage 5 chronic kidney disease or end stage renal disease: Secondary | ICD-10-CM | POA: Insufficient documentation

## 2023-12-02 DIAGNOSIS — N186 End stage renal disease: Secondary | ICD-10-CM | POA: Insufficient documentation

## 2023-12-02 DIAGNOSIS — E11621 Type 2 diabetes mellitus with foot ulcer: Secondary | ICD-10-CM | POA: Diagnosis present

## 2023-12-02 DIAGNOSIS — E1122 Type 2 diabetes mellitus with diabetic chronic kidney disease: Secondary | ICD-10-CM | POA: Diagnosis not present

## 2023-12-02 DIAGNOSIS — E1151 Type 2 diabetes mellitus with diabetic peripheral angiopathy without gangrene: Secondary | ICD-10-CM | POA: Insufficient documentation

## 2023-12-02 DIAGNOSIS — I2510 Atherosclerotic heart disease of native coronary artery without angina pectoris: Secondary | ICD-10-CM | POA: Diagnosis not present

## 2023-12-02 DIAGNOSIS — G4730 Sleep apnea, unspecified: Secondary | ICD-10-CM | POA: Diagnosis not present

## 2023-12-02 DIAGNOSIS — E1143 Type 2 diabetes mellitus with diabetic autonomic (poly)neuropathy: Secondary | ICD-10-CM | POA: Insufficient documentation

## 2023-12-02 NOTE — Progress Notes (Addendum)
 RITHWIK, SCHMIEG ([id number]) [id number] Page 1 of 8 Visit Report for 12/02/2023 Chief Complaint Document Details Patient Name: Date of Service: DEANNA NEVILLE [name] 12/02/2023 1:15 PM Medical Record Number: [id number] Patient Account Number: [id number] Date of Birth/Sex: Treating RN: 22-Jan-1941 (83 y.o. [name] Claudene Blossom Primary Care Provider: Alla Amis Other Clinician: Referring Provider: Treating Provider/Extender: Bethena Andre Alla Amis Weeks in Treatment: 5 Information Obtained from: Patient Chief Complaint Right heel ulcer and right 2nd toe ulcer Electronic Signature(s) Signed: 12/02/2023 4:41:38 PM By: Bethena Andre PA-C Previous Signature: 12/02/2023 1:25:33 PM Version By: Bethena Andre PA-C Entered By: Bethena Andre on 12/02/2023 13:41:38 -------------------------------------------------------------------------------- Debridement Details Patient Name: Date of Service: DEANNA NEVILLE [name] 12/02/2023 1:15 PM Medical Record Number: [id number] Patient Account Number: [id number] Date of Birth/Sex: Treating RN: 04-08-1941 (83 y.o. [name] Claudene Blossom Primary Care Provider: Alla Amis Other Clinician: Referring Provider: Treating Provider/Extender: Bethena Andre Alla Amis Weeks in Treatment: 5 Debridement Performed for Assessment: Wound #1 Right,Medial Calcaneus Performed By: Physician Bethena Andre, PA-C The following information was scribed by: Claudene Blossom The information was scribed for: Bethena Andre Debridement Type: Debridement Severity of Tissue Pre Debridement: Fat layer exposed Level of Consciousness (Pre-procedure): Awake and Alert Pre-procedure Verification/Time Out Yes - 14:01 Taken: Start Time: 14:01 Pain Control: Lidocaine  4% Topical Solution Percent of Wound Bed Debrided: 100% T Area Debrided (cm): otal 0.01 Tissue and other material debrided: Non-Viable, Callus Level: Non-Viable Tissue Debridement Description:  Selective/Open Wound Instrument: Curette Bleeding: Minimum MOREY, ANDONIAN ([id number]) [id number] Page 2 of 8 Hemostasis Achieved: Pressure Procedural Pain: 0 Post Procedural Pain: 0 Response to Treatment: Procedure was tolerated well Level of Consciousness (Post- Awake and Alert procedure): Post Debridement Measurements of Total Wound Length: (cm) 0.1 Width: (cm) 0.1 Depth: (cm) 0.1 Volume: (cm) 0.001 Character of Wound/Ulcer Post Debridement: Stable Severity of Tissue Post Debridement: Fat layer exposed Post Procedure Diagnosis Same as Pre-procedure Electronic Signature(s) Signed: 12/02/2023 5:05:37 PM By: Claudene Blossom MSN RN CNS WTA Signed: 12/02/2023 5:07:17 PM By: Bethena Andre PA-C Entered By: Claudene Blossom on 12/02/2023 11:11:08 -------------------------------------------------------------------------------- HPI Details Patient Name: Date of Service: DEANNA NEVILLE [name] 12/02/2023 1:15 PM Medical Record Number: [id number] Patient Account Number: [id number] Date of Birth/Sex: Treating RN: [birth year] (83 y.o. [name] Claudene Blossom Primary Care Provider: Alla Amis Other Clinician: Referring Provider: Treating Provider/Extender: Bethena Andre Alla Amis Weeks in Treatment: 5 History of Present Illness Chronic/Inactive Conditions Condition 1: 10-22-2023 patient's ABIs on screening today were 0.93 on the left and the right and appear to be sufficient for wound healing. HPI Description: 10-22-2023 upon evaluation today patient appears to be doing somewhat poorly in regard to wounds on his heels he has 1 on the left heel which is not really doing too badly and 1 on the right heel that is a little bit more significant at this point although there is a lot of callus the wound itself does not appear to be infected and in general I do not think this is too bad overall. The patient tells me he has not really been doing anything in particular from  a dressing standpoint he is just been covering it with a sock primarily. Therefore we need to get some things moving as far as his wound is concerned with regard to dressings. I did advise him he really does not need to see podiatry as we can be taken care of the wounds and get this healed. Patient does have a history of  diabetes mellitus type 2, diabetic neuropathy, and hypertension. His most recent hemoglobin A1c was 6.8 and that was actually on 04-06-2023 11-02-2023 upon evaluation patient's wounds are showing signs of improvement. With that being said he is definitely getting being needed some sharp debridement today to clearway some of the necrotic debris. Fortunately I do not see any signs of active infection 11-10-2023 upon evaluation today patient appears to be doing great currently in regard to his wound which is getting very close to complete resolution. Fortunately there does not appear to be any signs of active infection at this time which is great news and in general I do believe that making good headway here towards closure which is also excellent news. I do not see any evidence of worsening overall. No fevers, chills, nausea, vomiting, or diarrhea. 11-17-2023 upon evaluation today patient appears to be doing well currently in regard to his heel he is pretty much just about done. Fortunately I do not see any signs of active infection at this time. I do think that he is making good headway here and I am very pleased with where things stand. I do not see any signs of active infection at this time. 12-01-2022 upon evaluation today patient appears to be doing well currently in regard to his wound. Has been tolerating the dressing changes without complication. He does have a new toe ulcer which unfortunately is on the right foot as well. This is more of a skin tear he tells me that he hit this on something and that is what led to the issue here. Fortunately I do not see any signs of infection at  this time which is great news. I think in fact this may already be headed towards complete healing. Electronic Signature(s) Signed: 12/02/2023 4:42:19 PM By: Bethena Andre DEVONNA WADDELL, Alanson K ([id number]) By: Bethena Andre DEVONNA [phone].pdf Page 3 of 8 Signed: 12/02/2023 4:42:19 PM Entered By: Bethena Andre on 12/02/2023 13:42:19 -------------------------------------------------------------------------------- Physical Exam Details Patient Name: Date of Service: DEANNA NEVILLE [name] 12/02/2023 1:15 PM Medical Record Number: [id number] Patient Account Number: [id number] Date of Birth/Sex: Treating RN: 13-Feb-1941 (83 y.o. [name] Claudene Blossom Primary Care Provider: Alla Amis Other Clinician: Referring Provider: Treating Provider/Extender: Bethena Andre Alla Amis Weeks in Treatment: 5 Constitutional Well-nourished and well-hydrated in no acute distress. Respiratory normal breathing without difficulty. Psychiatric this patient is able to make decisions and demonstrates good insight into disease process. Alert and Oriented x 3. pleasant and cooperative. Notes Upon inspection patient's wound bed actually showed signs of good granulation and epithelization at this point. Fortunately I do not see any signs of worsening overall and I do believe that the patient is making good headway here towards closure which is good news. I did perform a significant debridement of the callus to try to trim this down and he tolerated this without complication today. Electronic Signature(s) Signed: 12/02/2023 4:42:40 PM By: Bethena Andre PA-C Entered By: Bethena Andre on 12/02/2023 13:42:40 -------------------------------------------------------------------------------- Physician Orders Details Patient Name: Date of Service: DEANNA NEVILLE [name] 12/02/2023 1:15 PM Medical Record Number: [id number] Patient Account Number: [id number] Date of Birth/Sex: Treating RN: [birth year] (83 y.o. 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 Bathing/ Shower/ Hygiene May shower; gently cleanse wound with antibacterial soap, rinse and pat dry prior to dressing wounds - Recommended to use Dial original gold soap and shower on days of dressing change. No tub bath. Anesthetic (Use 'Patient Medications' Section for Anesthetic Order Entry) Lidocaine  applied to wound bed Edema Control - Orders / Instructions Elevate, Exercise Daily and A void Standing for Long Periods of Time. Elevate legs to the level of the heart and pump ankles as often as possible Elevate leg(s) parallel to the floor when sitting. DO YOUR BEST to sleep in the bed at night. DO NOT sleep in your recliner. Long hours of sitting in a recliner leads to swelling of the legs and/or potential wounds on your backside. Wound Treatment Wound #1 - Calcaneus Wound Laterality: Right, Medial Cleanser: Byram Ancillary Kit - 15 Day Supply (Generic) 3 x Per Week/15 Days Discharge Instructions: Use supplies as instructed; Kit contains: (15) Saline Bullets; (15) 3x3 Gauze; 15 pr Gloves Cleanser: Wound Cleanser 3 x Per Week/15 Days Discharge Instructions: Wash your hands with soap and water. Remove old dressing, discard into plastic bag and place into  trash. Cleanse the wound with Wound Cleanser prior to applying a clean dressing using gauze sponges, not tissues or cotton balls. Do not scrub or use excessive force. Pat dry using gauze sponges, not tissue or cotton balls. Peri-Wound Care: AandD Ointment 3 x Per Week/15 Days Discharge Instructions: Apply AandD Ointment to periwound and wound before applying Hydrofera Blue Wound #3 - T Second oe Wound Laterality: Right, Anterior Cleanser: Soap and Water 1 x Per Day/15 Days Discharge Instructions: Gently cleanse wound with antibacterial soap, rinse and pat dry prior to dressing wounds Prim Dressing: Xeroform-HBD 2x2 (in/in) 1 x Per Day/15 Days ary Discharge Instructions: Apply Xeroform-HBD 2x2 (in/in) as directed Secondary Dressing: Coverlet Latex-Free Fabric Adhesive Dressings 1 x Per Day/15 Days Discharge Instructions: 1.5 x 2 Electronic Signature(s) Signed: 12/02/2023 5:05:37 PM By: Claudene Blossom MSN RN CNS WTA Signed: 12/02/2023 5:07:17 PM By: Bethena Ferraris PA-C Entered By: Claudene Blossom on 12/02/2023 11:14:25 -------------------------------------------------------------------------------- Problem List Details Patient Name: Date of Service: DEANNA NEVILLE [name] 12/02/2023 1:15 PM Medical Record Number: [id number] Patient Account Number: [id number] Date of Birth/Sex: Treating RN: Dec 04, 1940 (83 y.o. 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 10/22/2023 No Yes I10 Essential (primary) hypertension 10/22/2023 No Yes Inactive Problems Resolved Problems ICD-10 Code Description Active Date Resolved Date L89.622 Pressure ulcer of left heel, stage 2 10/22/2023 10/22/2023 Electronic Signature(s) Signed: 12/02/2023 4:41:22 PM By: Bethena Ferraris PA-C Previous Signature: 12/02/2023 1:25:28 PM Version By: Bethena Ferraris PA-C Entered By: Bethena Ferraris on 12/02/2023 13:41:22 -------------------------------------------------------------------------------- Progress Note Details Patient Name: Date of Service: DEANNA NEVILLE [name] 12/02/2023  1:15 PM Medical Record Number: [id number] Patient Account Number: [id number] Date of Birth/Sex: Treating RN: 1941/09/20 (83 y.o. [name] Claudene Blossom Primary Care Provider: Alla Amis Other Clinician: Referring Provider: Treating Provider/Extender: Bethena Andre Alla Amis Weeks in Treatment: 5 Subjective Chief Complaint Information obtained from Patient Right heel ulcer and right 2nd toe ulcer History of Present Illness (HPI) Chronic/Inactive Condition: 10-22-2023 patient's ABIs on screening today were 0.93 on the left and the right and appear to be sufficient for wound healing. TALI, COSTER ([id number]) [id number] Page 6 of 8 10-22-2023 upon evaluation today patient appears to be doing somewhat poorly in regard to wounds on his heels he has 1 on the left heel which is not really doing too badly and 1 on the right heel that is a little bit more significant at this point although there is a lot of callus the wound itself does not appear to be infected and in general I do not think this is too bad overall. The patient tells me he has not really been doing anything in particular from a dressing standpoint he is just been covering it with a sock primarily. Therefore we need to get some things moving as far as his wound  is concerned with regard to dressings. I did advise him he really does not need to see podiatry as we can be taken care of the wounds and get this healed. 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 His most recent hemoglobin A1c was 6.8 and that was actually on 04-06-2023 11-02-2023 upon evaluation patient's wounds are showing signs of improvement. With that being said he is definitely getting being needed some sharp debridement today to clearway some of the necrotic debris. Fortunately I do not see any signs of active infection 11-10-2023 upon evaluation today patient appears to be doing great currently in regard to his wound which is getting very close to complete resolution. Fortunately there does not appear to be any signs of active infection at this time which is great news and in general I do believe that making good headway here towards closure which is also excellent news. I do not see any evidence of worsening overall. No fevers, chills, nausea, vomiting, or diarrhea. 11-17-2023 upon evaluation today patient appears to be doing well currently in regard to his heel he is pretty much just about done. Fortunately I do not see any signs of active infection at this time. I do think that he is making good headway here and I am very pleased with where things stand. I do not see any signs of active infection at this time. 12-01-2022 upon evaluation today patient appears to be doing well currently in regard to his wound. Has been tolerating the dressing changes without complication. He does have a new toe ulcer which unfortunately is on the right foot as well. This is more of a skin tear he tells me that he hit this on something and that is what led to the issue here. Fortunately I do not see any signs of infection at this time which is great news. I think in fact this may already be headed towards complete  healing. Objective Constitutional Well-nourished and well-hydrated in no acute distress. Vitals Time Taken: 1:37 PM, Height: 72 in, Weight: 214 lbs, BMI: 29, Temperature: 97.7 F, Pulse: 61 bpm, Respiratory Rate: 18 breaths/min, Blood Pressure: 188/77 mmHg. Respiratory normal breathing without difficulty. Psychiatric this patient is able to make decisions and demonstrates good insight  into disease process. Alert and Oriented x 3. pleasant and cooperative. General Notes: Upon inspection patient's wound bed actually showed signs of good granulation and epithelization at this point. Fortunately I do not see any signs of worsening overall and I do believe that the patient is making good headway here towards closure which is good news. I did perform a significant debridement of the callus to try to trim this down and he tolerated this without complication today. Integumentary (Hair, Skin) Wound #1 status is Open. Original cause of wound was Blister. The date acquired was: 10/08/2023. The wound has been in treatment 5 weeks. The wound is located on the Right,Medial Calcaneus. The wound measures 0.1cm length x 0.1cm width x 0.1cm depth; 0.008cm^2 area and 0.001cm^3 volume. There is Fat Layer (Subcutaneous Tissue) exposed. There is a medium amount of serosanguineous drainage noted. There is small (1-33%) red, pink granulation within the wound bed. There is a large (67-100%) amount of necrotic tissue within the wound bed. Wound #3 status is Open. Original cause of wound was Trauma. The date acquired was: 11/26/2023. The wound is located on the Right,Anterior T Second. oe The wound measures 1cm length x 0.3cm width x 0.1cm depth; 0.236cm^2 area and 0.024cm^3 volume. There is a medium amount of serosanguineous drainage noted. There is medium (34-66%) red, pink granulation within the wound bed. There is no necrotic tissue within the wound bed. Assessment Active Problems ICD-10 Type 2 diabetes mellitus  with foot ulcer Pressure ulcer of right heel, stage 2 Laceration without foreign body of right lesser toe(s) without damage to nail, initial encounter Type 2 diabetes mellitus with diabetic autonomic (poly)neuropathy Essential (primary) hypertension Procedures Wound #1 Pre-procedure diagnosis of Wound #1 is a Diabetic Wound/Ulcer of the Lower Extremity located on the Right,Medial Calcaneus .Severity of Tissue Pre Debridement is: Fat layer exposed. There was a Selective/Open Wound Non-Viable Tissue Debridement with a total area of 0.01 sq cm performed by Bethena Ferraris, PA-C. With the following instrument(s): Curette to remove Non-Viable tissue/material. Material removed includes Callus after achieving pain control using TAKSH, HJORT ([id number]) [phone].pdf Page 7 of 8 Lidocaine  4% Topical Solution. No specimens were taken. A time out was conducted at 14:01, prior to the start of the procedure. A Minimum amount of bleeding was controlled with Pressure. The procedure was tolerated well with a pain level of 0 throughout and a pain level of 0 following the procedure. Post Debridement Measurements: 0.1cm length x 0.1cm width x 0.1cm depth; 0.001cm^3 volume. Character of Wound/Ulcer Post Debridement is stable. Severity of Tissue Post Debridement is: Fat layer exposed. Post procedure Diagnosis Wound #1: Same as Pre-Procedure Plan Follow-up Appointments: Return Appointment in 2 weeks. Bathing/ Shower/ Hygiene: May shower; gently cleanse wound with antibacterial soap, rinse and pat dry prior to dressing wounds - Recommended to use Dial original gold soap and shower on days of dressing change. No tub bath. Anesthetic (Use 'Patient Medications' Section for Anesthetic Order Entry): Lidocaine  applied to wound bed Edema Control - Orders / Instructions: Elevate, Exercise Daily and Avoid Standing for Long Periods of Time. Elevate legs to the level of the heart and pump ankles  as often as possible Elevate leg(s) parallel to the floor when sitting. DO YOUR BEST to sleep in the bed at night. DO NOT sleep in your recliner. Long hours of sitting in a recliner leads to swelling of the legs and/or potential wounds on your backside. WOUND #1: - Calcaneus Wound Laterality: Right, Medial Cleanser: Byram Ancillary Kit -  15 Day Supply (Generic) 3 x Per Week/15 Days Discharge Instructions: Use supplies as instructed; Kit contains: (15) Saline Bullets; (15) 3x3 Gauze; 15 pr Gloves Cleanser: Wound Cleanser 3 x Per Week/15 Days Discharge Instructions: Wash your hands with soap and water. Remove old dressing, discard into plastic bag and place into trash. Cleanse the wound with Wound Cleanser prior to applying a clean dressing using gauze sponges, not tissues or cotton balls. Do not scrub or use excessive force. Pat dry using gauze sponges, not tissue or cotton balls. Peri-Wound Care: AandD Ointment 3 x Per Week/15 Days Discharge Instructions: Apply AandD Ointment to periwound and wound before applying Hydrofera Blue WOUND #3: - T Second Wound Laterality: Right, Anterior oe Cleanser: Soap and Water 1 x Per Day/15 Days Discharge Instructions: Gently cleanse wound with antibacterial soap, rinse and pat dry prior to dressing wounds Prim Dressing: Xeroform-HBD 2x2 (in/in) 1 x Per Day/15 Days ary Discharge Instructions: Apply Xeroform-HBD 2x2 (in/in) as directed Secondary Dressing: Coverlet Latex-Free Fabric Adhesive Dressings 1 x Per Day/15 Days Discharge Instructions: 1.5 x 2 1. I would recommend that we have the patient continue to monitor for any signs of worsening I am going to have him go ahead and use the topical urea cream to the heel to try to see if we can soften up a lot of this dried callus. 2. I am also can recommend the patient should continue to monitor for any signs of infection or worsening of her to be using Xeroform over his toe ulcer this 1 just appears to be a  slight laceration on hoping that this will heal quite readily. We will see patient back for reevaluation in 1 week here in the clinic. If anything worsens or changes patient will contact our office for additional recommendations. Electronic Signature(s) Signed: 12/02/2023 4:43:12 PM By: Bethena Ferraris PA-C Entered By: Bethena Ferraris on 12/02/2023 13:43:12 -------------------------------------------------------------------------------- SuperBill Details Patient Name: Date of Service: DEANNA NEVILLE [name] 12/02/2023 Medical Record Number: [id number] Patient Account Number: [id number] Date of Birth/Sex: Treating RN: Jun 12, 1941 (83 y.o. 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

## 2023-12-02 NOTE — Progress Notes (Addendum)
 CHARLES, ANDRINGA ([id number]) [id number] Page 1 of 11 Visit Report for 12/02/2023 Arrival Information Details Patient Name: Date of Service: DEANNA NEVILLE [name] 12/02/2023 1:15 PM Medical Record Number: [id number] Patient Account Number: [id number] Date of Birth/Sex: Treating RN: [birth year] (83 y.o. [name] Claudene Blossom Primary Care Zaylie Gisler: Alla Amis Other Clinician: Referring Tambi Thole: Treating Davene Jobin/Extender: Bethena Andre Alla Amis Devra in Treatment: 5 Visit Information History Since Last Visit Added or deleted any medications: No Patient Arrived: Ambulatory Any new allergies or adverse reactions: No Arrival Time: 13:25 Had a fall or experienced change in No Accompanied By: self activities of daily living that may affect Transfer Assistance: None risk of falls: Patient Identification Verified: Yes Signs or symptoms of abuse/neglect since last visito No Secondary Verification Process Completed: Yes Hospitalized since last visit: No Patient Requires Transmission-Based Precautions: No Has Dressing in Place as Prescribed: Yes Patient Has Alerts: Yes Pain Present Now: No Patient Alerts: Patient on Blood Thinner type 2 diabetic Electronic Signature(s) Signed: 12/02/2023 5:05:37 PM By: Claudene Blossom MSN RN CNS WTA Entered By: Claudene Blossom on 12/02/2023 10:34:16 -------------------------------------------------------------------------------- Clinic Level of Care Assessment Details Patient Name: Date of Service: DEANNA NEVILLE [name] 12/02/2023 1:15 PM Medical Record Number: [id number] Patient Account Number: [id number] Date of Birth/Sex: Treating RN: 1941-09-25 (83 y.o. [name] Claudene Blossom Primary Care Corderius Saraceni: Alla Amis Other Clinician: Referring Yusuke Beza: Treating Asaiah Hunnicutt/Extender: Bethena Andre Alla Amis Devra in Treatment: 5 Clinic Level of Care Assessment Items TOOL 4 Quantity Score []  - 0 Use when only an EandM is  performed on FOLLOW-UP visit ASSESSMENTS - Nursing Assessment / Reassessment []  - 0 Reassessment of Co-morbidities (includes updates in patient status) []  - 0 Reassessment of Adherence to Treatment Plan ASSESSMENTS - Wound and Skin A ssessment / Reassessment []  - 0 Simple Wound Assessment / Reassessment - one wound DAXX, TIGGS ([id number]) [phone] Page 2 of 11 []  - 0 Complex Wound Assessment / Reassessment - multiple wounds []  - 0 Dermatologic / Skin Assessment (not related to wound area) ASSESSMENTS - Focused Assessment []  - 0 Circumferential Edema Measurements - multi extremities []  - 0 Nutritional Assessment / Counseling / Intervention []  - 0 Lower Extremity Assessment (monofilament, tuning fork, pulses) []  - 0 Peripheral Arterial Disease Assessment (using hand held doppler) ASSESSMENTS - Ostomy and/or Continence Assessment and Care []  - 0 Incontinence Assessment and Management []  - 0 Ostomy Care Assessment and Management (repouching, etc.) PROCESS - Coordination of Care []  - 0 Simple Patient / Family Education for ongoing care []  - 0 Complex (extensive) Patient / Family Education for ongoing care []  - 0 Staff obtains Chiropractor, Records, T Results / Process Orders est []  - 0 Staff telephones HHA, Nursing Homes / Clarify orders / etc []  - 0 Routine Transfer to another Facility (non-emergent condition) []  - 0 Routine Hospital Admission (non-emergent condition) []  - 0 New Admissions / Manufacturing Engineer / Ordering NPWT Apligraf, etc. , []  - 0 Emergency Hospital Admission (emergent condition) []  - 0 Simple Discharge Coordination []  - 0 Complex (extensive) Discharge Coordination PROCESS - Special Needs []  - 0 Pediatric / Minor Patient Management []  - 0 Isolation Patient Management []  - 0 Hearing / Language / Visual special needs []  - 0 Assessment of Community assistance (transportation, D/C planning, etc.) []  -  0 Additional assistance / Altered mentation []  - 0 Support Surface(s) Assessment (bed, cushion, seat, etc.) INTERVENTIONS - Wound Cleansing / Measurement []  - 0 Simple Wound Cleansing - one wound []  -  0 Complex Wound Cleansing - multiple wounds []  - 0 Wound Imaging (photographs - any number of wounds) []  - 0 Wound Tracing (instead of photographs) []  - 0 Simple Wound Measurement - one wound []  - 0 Complex Wound Measurement - multiple wounds INTERVENTIONS - Wound Dressings []  - 0 Small Wound Dressing one or multiple wounds []  - 0 Medium Wound Dressing one or multiple wounds []  - 0 Large Wound Dressing one or multiple wounds []  - 0 Application of Medications - topical []  - 0 Application of Medications - injection INTERVENTIONS - Miscellaneous []  - 0 External ear exam []  - 0 Specimen Collection (cultures, biopsies, blood, body fluids, etc.) []  - 0 Specimen(s) / Culture(s) sent or taken to Lab for analysis CECILIO, OHLRICH ([id number]) [phone].pdf Page 3 of 11 []  - 0 Patient Transfer (multiple staff / Nurse, Adult / Similar devices) []  - 0 Simple Staple / Suture removal (25 or less) []  - 0 Complex Staple / Suture removal (26 or more) []  - 0 Hypo / Hyperglycemic Management (close monitor of Blood Glucose) []  - 0 Ankle / Brachial Index (ABI) - do not check if billed separately []  - 0 Vital Signs Has the patient been seen at the hospital within the last three years: Yes Total Score: 0 Level Of Care: ____ Electronic Signature(s) Signed: 12/02/2023 5:05:37 PM By: Claudene Blossom MSN RN CNS WTA Entered By: Claudene Blossom on 12/02/2023 11:20:19 -------------------------------------------------------------------------------- Encounter Discharge Information Details Patient Name: Date of Service: DEANNA NEVILLE [name] 12/02/2023 1:15 PM Medical Record Number: [id number] Patient Account Number: [id number] Date of Birth/Sex: Treating RN: May 05, 1941 (83 y.o. [name] Claudene Blossom Primary Care Ozias Dicenzo: Alla Amis Other Clinician: Referring Ernie Sagrero: Treating Izaias Krupka/Extender: Bethena Andre Alla Amis Devra in Treatment: 5 Encounter Discharge Information Items Post Procedure Vitals Discharge Condition: Stable Temperature (F): 97.7 Ambulatory Status: Ambulatory Pulse (bpm): 61 Discharge Destination: Home Respiratory Rate (breaths/min): 18 Transportation: Private Auto Blood Pressure (mmHg): 188/77 Accompanied By: self Schedule Follow-up Appointment: Yes Clinical Summary of Care: Electronic Signature(s) Signed: 12/02/2023 5:05:37 PM By: Claudene Blossom MSN RN CNS WTA Entered By: Claudene Blossom on 12/02/2023 11:22:35 -------------------------------------------------------------------------------- Lower Extremity Assessment Details Patient Name: Date of Service: DEANNA NEVILLE [name] 12/02/2023 1:15 PM Medical Record Number: [id number] Patient Account Number: [id number] Date of Birth/Sex: Treating RN: 06-16-1941 (83 y.o. [name] Claudene Blossom Primary Care Monaye Blackie: Alla Amis Other Clinician: Barbary, Dezi K ([id number]) [id number] Page 4 of 11 Referring Kaci Dillie: Treating Reco Shonk/Extender: Bethena Andre Alla Amis Devra in Treatment: 5 Electronic Signature(s) Signed: 12/02/2023 5:05:37 PM By: Claudene Blossom MSN RN CNS WTA Entered By: Claudene Blossom on 12/02/2023 11:12:05 -------------------------------------------------------------------------------- Multi Wound Chart Details Patient Name: Date of Service: DEANNA NEVILLE [name] 12/02/2023 1:15 PM Medical Record Number: [id number] Patient Account Number: [id number] Date of Birth/Sex: Treating RN: 01-31-1941 (83 y.o. [name] Claudene Blossom Primary Care Tassie Pollett: Alla Amis Other Clinician: Referring Carlester Kasparek: Treating Iyari Hagner/Extender: Bethena Andre Alla Amis Weeks in Treatment: 5 Vital Signs Height(in): 72 Pulse(bpm): 61 Weight(lbs): 214 Blood  Pressure(mmHg): 188/77 Body Mass Index(BMI): 29 Temperature(F): 97.7 Respiratory Rate(breaths/min): 18 [1:Photos:] [N/A:N/A] Right, Medial Calcaneus Right, Anterior T Second oe N/A Wound Location: Blister Trauma N/A Wounding Event: Diabetic Wound/Ulcer of the Lower Trauma, Other N/A Primary Etiology: Extremity Glaucoma, Sleep Apnea, Coronary Glaucoma, Sleep Apnea, Coronary N/A Comorbid History: Artery Disease, Hypertension, Type IIArtery Disease, Hypertension, Type II Diabetes, End Stage Renal Disease, Diabetes, End Stage Renal Disease, Neuropathy, Seizure Disorder Neuropathy, Seizure Disorder 10/08/2023 11/26/2023 N/A Date Acquired:  5 0 N/A Weeks of Treatment: Open Open N/A Wound Status: No No N/A Wound Recurrence: 0.1x0.1x0.1 1x0.3x0.1 N/A Measurements L x W x D (cm) 0.008 0.236 N/A A (cm) : rea 0.001 0.024 N/A Volume (cm) : 100.00% N/A N/A % Reduction in Area: 99.90% N/A N/A % Reduction in Volume: Grade 2 Full Thickness Without Exposed N/A Classification: Support Structures Medium Medium N/A Exudate Amount: Serosanguineous Serosanguineous N/A Exudate Type: red, brown red, brown N/A Exudate Color: Small (1-33%) Medium (34-66%) N/A Granulation Amount: Red, Pink Red, Pink N/A Granulation Quality: Large (67-100%) None Present (0%) N/A Necrotic Amount: Fat Layer (Subcutaneous Tissue): Yes Fascia: No N/A Exposed Structures: Fat Layer (Subcutaneous Tissue): No Tendon: No Muscle: No Joint: No JEANPAUL, BIEHL ([id number]) [id number] Page 5 of 11 Bone: No None N/A N/A Epithelialization: Debridement - Selective/Open Wound N/A N/A Debridement: 14:01 N/A N/A Pre-procedure Verification/Time Out Taken: Lidocaine  4% Topical Solution N/A N/A Pain Control: Callus N/A N/A Tissue Debrided: Non-Viable Tissue N/A N/A Level: 0.01 N/A N/A Debridement A (sq cm): rea Curette N/A N/A Instrument: Minimum N/A N/A Bleeding: Pressure N/A  N/A Hemostasis A chieved: 0 N/A N/A Procedural Pain: 0 N/A N/A Post Procedural Pain: Procedure was tolerated well N/A N/A Debridement Treatment Response: 0.1x0.1x0.1 N/A N/A Post Debridement Measurements L x W x D (cm) 0.001 N/A N/A Post Debridement Volume: (cm) Debridement N/A N/A Procedures Performed: Treatment Notes Electronic Signature(s) Signed: 12/02/2023 5:05:37 PM By: Claudene Blossom MSN RN CNS WTA Entered By: Claudene Blossom on 12/02/2023 11:12:15 -------------------------------------------------------------------------------- Multi-Disciplinary Care Plan Details Patient Name: Date of Service: DEANNA NEVILLE [name] 12/02/2023 1:15 PM Medical Record Number: [id number] Patient Account Number: [id number] Date of Birth/Sex: Treating RN: May 06, 1941 (83 y.o. [name] Claudene Blossom Primary Care Elyza Whitt: Alla Amis Other Clinician: Referring Yoav Okane: Treating Meha Vidrine/Extender: Bethena Andre Alla Amis Weeks in Treatment: 5 Active Inactive Necrotic Tissue Nursing Diagnoses: Impaired tissue integrity related to necrotic/devitalized tissue Knowledge deficit related to management of necrotic/devitalized tissue Goals: Necrotic/devitalized tissue will be minimized in the wound bed Date Initiated: 10/22/2023 Target Resolution Date: 12/17/2023 Goal Status: Active Patient/caregiver will verbalize understanding of reason and process for debridement of necrotic tissue Date Initiated: 10/22/2023 Date Inactivated: 10/22/2023 Target Resolution Date: 10/22/2023 Goal Status: Met Interventions: Assess patient pain level pre-, during and post procedure and prior to discharge Provide education on necrotic tissue and debridement process Treatment Activities: Apply topical anesthetic as ordered : 10/22/2023 Excisional debridement : 10/22/2023 Notes: LYNDLE, PANG ([id number]) [id number] Page 6 of 11 Wound/Skin Impairment Nursing Diagnoses: Impaired tissue  integrity Knowledge deficit related to ulceration/compromised skin integrity Goals: Ulcer/skin breakdown will have a volume reduction of 30% by week 4 Date Initiated: 10/22/2023 Date Inactivated: 12/02/2023 Target Resolution Date: 11/19/2023 Goal Status: Met Ulcer/skin breakdown will have a volume reduction of 50% by week 8 Date Initiated: 10/22/2023 Target Resolution Date: 12/17/2023 Goal Status: Active Ulcer/skin breakdown will have a volume reduction of 80% by week 12 Date Initiated: 10/22/2023 Target Resolution Date: 01/14/2024 Goal Status: Active Ulcer/skin breakdown will heal within 14 weeks Date Initiated: 10/22/2023 Target Resolution Date: 01/28/2024 Goal Status: Active Interventions: Assess patient/caregiver ability to obtain necessary supplies Assess patient/caregiver ability to perform ulcer/skin care regimen upon admission and as needed Assess ulceration(s) every visit Provide education on ulcer and skin care Notes: Electronic Signature(s) Signed: 12/02/2023 5:05:37 PM By: Claudene Blossom MSN RN CNS WTA Entered By: Claudene Blossom on 12/02/2023 11:21:07 -------------------------------------------------------------------------------- Pain Assessment Details Patient Name: Date of Service: DEANNA NEVILLE [name] 12/02/2023 1:15  PM Medical Record Number: [id number] Patient Account Number: [id number] Date of Birth/Sex: Treating RN: 15-May-1941 (83 y.o. [name] Claudene Blossom Primary Care Genetta Fiero: Alla Amis Other Clinician: Referring Modestine Scherzinger: Treating Nikayla Madaris/Extender: Bethena Andre Alla Amis Weeks in Treatment: 5 Active Problems Location of Pain Severity and Description of Pain Patient Has Paino No Site Locations [place], [place] ([id number]) [id number] Page 7 of 11 Pain Management and Medication Current Pain Management: Electronic Signature(s) Signed: 12/02/2023 5:05:37 PM By: Claudene Blossom MSN RN CNS WTA Entered By: Claudene Blossom on 12/02/2023  10:37:28 -------------------------------------------------------------------------------- Patient/Caregiver Education Details Patient Name: Date of Service: DEANNA NEVILLE [name] 1/2/2025andnbsp1:15 PM Medical Record Number: [id number] Patient Account Number: [id number] Date of Birth/Gender: Treating RN: [birth year] (83 y.o. [name] Claudene Blossom Primary Care Physician: Alla Amis Other Clinician: Referring Physician: Treating Physician/Extender: Bethena Andre Alla Amis Devra in Treatment: 5 Education Assessment Education Provided To: Patient Education Topics Provided Wound Debridement: Handouts: Wound Debridement Methods: Explain/Verbal Responses: State content correctly Wound/Skin Impairment: Handouts: Caring for Your Ulcer Methods: Explain/Verbal Responses: State content correctly Electronic Signature(s) Signed: 12/02/2023 5:05:37 PM By: Claudene Blossom MSN RN CNS WTA Entered By: Claudene Blossom on 12/02/2023 11:21:22 WADDELL CHARLIE POUR ([id number]) [id number] Page 8 of 11 -------------------------------------------------------------------------------- Wound Assessment Details Patient Name: Date of Service: DEANNA NEVILLE [name] 12/02/2023 1:15 PM Medical Record Number: [id number] Patient Account Number: [id number] Date of Birth/Sex: Treating RN: [birth year] (83 y.o. [name] Claudene Blossom Primary Care Sintia Mckissic: Alla Amis Other Clinician: Referring Siriah Treat: Treating Bobbi Kozakiewicz/Extender: Bethena Andre Alla Amis Weeks in Treatment: 5 Wound Status Wound Number: 1 Primary Diabetic Wound/Ulcer of the Lower Extremity Etiology: Wound Location: Right, Medial Calcaneus Wound Open Wounding Event: Blister Status: Date Acquired: 10/08/2023 Comorbid Glaucoma, Sleep Apnea, Coronary Artery Disease, Hypertension, Weeks Of Treatment: 5 History: Type II Diabetes, End Stage Renal Disease, Neuropathy, Seizure Clustered Wound: No Disorder Photos Wound  Measurements Length: (cm) 0.1 Width: (cm) 0.1 Depth: (cm) 0.1 Area: (cm) 0.008 Volume: (cm) 0.001 % Reduction in Area: 100% % Reduction in Volume: 99.9% Epithelialization: None Wound Description Classification: Grade 2 Exudate Amount: Medium Exudate Type: Serosanguineous Exudate Color: red, brown Foul Odor After Cleansing: No Slough/Fibrino Yes Wound Bed Granulation Amount: Small (1-33%) Exposed Structure Granulation Quality: Red, Pink Fat Layer (Subcutaneous Tissue) Exposed: Yes Necrotic Amount: Large (67-100%) Treatment Notes Wound #1 (Calcaneus) Wound Laterality: Right, Medial Cleanser [employer] Ancillary Kit - 15 Day Supply Discharge Instruction: Use supplies as instructed; Kit contains: (15) Saline Bullets; (15) 3x3 Gauze; 15 pr Gloves Wound Cleanser Discharge Instruction: Wash your hands with soap and water. Remove old dressing, discard into plastic bag and place into trash. Cleanse the wound with Wound Cleanser prior to applying a clean dressing using gauze sponges, not tissues or cotton balls. Do not scrub or use excessive force. Pat dry using gauze sponges, not tissue or cotton balls. DELANCE, WEIDE ([id number]) [id number] Page 9 of 11 Peri-Wound Care AandD Ointment Discharge Instruction: Apply AandD Ointment to periwound and wound before applying Hydrofera Blue Topical Primary Dressing Secondary Dressing Secured With Compression Wrap Compression Stockings Add-Ons Electronic Signature(s) Signed: 12/02/2023 5:05:37 PM By: Claudene Blossom MSN RN CNS WTA Entered By: Claudene Blossom on 12/02/2023 11:11:52 -------------------------------------------------------------------------------- Wound Assessment Details Patient Name: Date of Service: DEANNA NEVILLE [name] 12/02/2023 1:15 PM Medical Record Number: [id number] Patient Account Number: [id number] Date of Birth/Sex: Treating RN: Dec 31, 1940 (83 y.o. [name] Claudene Blossom Primary Care Anyelo Mccue: Alla Amis Other Clinician: Referring Corgan Mormile: Treating Keelen Quevedo/Extender: Bethena Andre Alla Amis Weeks in Treatment:  5 Wound Status Wound Number: 3 Primary Trauma, Other Etiology: Wound Location: Right, Anterior T Second oe Wound Open Wounding Event: Trauma Status: Date Acquired: 11/26/2023 Comorbid Glaucoma, Sleep Apnea, Coronary Artery Disease, Hypertension, Weeks Of Treatment: 0 History: Type II Diabetes, End Stage Renal Disease, Neuropathy, Seizure Clustered Wound: No Disorder Photos Wound Measurements Length: (cm) Width: (cm) Depth: (cm) Area: (cm) Volume: (cm) 1 % Reduction in Area: 0.3 % Reduction in Volume: 0.1 0.236 0.024 Wound Description Classification: Full Thickness Without Exposed Support Structures COTEY, RAKES K ([id number]) Exudate Amount: Medium Exudate Type: Serosanguineous Exudate Color: red, brown Foul Odor After Cleansing: No [phone].pdf Page 10 of 11 Slough/Fibrino No Wound Bed Granulation Amount: Medium (34-66%) Exposed Structure Granulation Quality: Red, Pink Fascia Exposed: No Necrotic Amount: None Present (0%) Fat Layer (Subcutaneous Tissue) Exposed: No Tendon Exposed: No Muscle Exposed: No Joint Exposed: No Bone Exposed: No Treatment Notes Wound #3 (Toe Second) Wound Laterality: Right, Anterior Cleanser Soap and Water Discharge Instruction: Gently cleanse wound with antibacterial soap, rinse and pat dry prior to dressing wounds Peri-Wound Care Topical Primary Dressing Xeroform-HBD 2x2 (in/in) Discharge Instruction: Apply Xeroform-HBD 2x2 (in/in) as directed Secondary Dressing Coverlet Latex-Free Fabric Adhesive Dressings Discharge Instruction: 1.5 x 2 Secured With Compression Wrap Compression Stockings Add-Ons Electronic Signature(s) Signed: 12/02/2023 5:05:37 PM By: Claudene Blossom MSN RN CNS WTA Entered By: Claudene Blossom on 12/02/2023  10:45:01 -------------------------------------------------------------------------------- Vitals Details Patient Name: Date of Service: DEANNA NEVILLE [name] 12/02/2023 1:15 PM Medical Record Number: [id number] Patient Account Number: [id number] Date of Birth/Sex: Treating RN: 1941/03/31 (83 y.o. [name] Claudene Blossom Primary Care Dianna Ewald: Alla Amis Other Clinician: Referring Claudetta Sallie: Treating Udell Mazzocco/Extender: Bethena Andre Alla Amis Weeks in Treatment: 5 Vital Signs Time Taken: 13:37 Temperature (F): 97.7 Height (in): 72 Pulse (bpm): 61 Weight (lbs): 214 Respiratory Rate (breaths/min): 18 Body Mass Index (BMI): 29 Blood Pressure (mmHg): 188/77 Reference Range: 80 - 120 mg / dl HAIDYN, CHADDERDON ([id number]) [id number] Page 11 of 11 Electronic Signature(s) Signed: 12/02/2023 5:05:37 PM By: Claudene Blossom MSN RN CNS WTA Entered By: Claudene Blossom on 12/02/2023 10:37:21

## 2023-12-09 ENCOUNTER — Encounter: Payer: Self-pay | Admitting: Podiatry

## 2023-12-09 ENCOUNTER — Encounter: Payer: Medicare Other | Admitting: Physician Assistant

## 2023-12-09 ENCOUNTER — Ambulatory Visit: Payer: Medicare Other | Admitting: Podiatry

## 2023-12-09 VITALS — Ht 71.5 in | Wt 214.0 lb

## 2023-12-09 DIAGNOSIS — M79674 Pain in right toe(s): Secondary | ICD-10-CM | POA: Diagnosis not present

## 2023-12-09 DIAGNOSIS — E11621 Type 2 diabetes mellitus with foot ulcer: Secondary | ICD-10-CM | POA: Diagnosis not present

## 2023-12-09 DIAGNOSIS — E1142 Type 2 diabetes mellitus with diabetic polyneuropathy: Secondary | ICD-10-CM

## 2023-12-09 DIAGNOSIS — B351 Tinea unguium: Secondary | ICD-10-CM

## 2023-12-09 DIAGNOSIS — M79675 Pain in left toe(s): Secondary | ICD-10-CM | POA: Diagnosis not present

## 2023-12-09 NOTE — Progress Notes (Addendum)
 ADALBERT, ALBERTO ([id number]) [id number] Page 1 of 9 Visit Report for 12/09/2023 Chief Complaint Document Details Patient Name: Date of Service: Elijah Oliver Elijah SAMUEL RD K. 12/09/2023 10:30 A M Medical Record Number: [id number] Patient Account Number: [id number] Date of Birth/Sex: Treating RN: [birth year] (83 y.o. [name] Vaughn Mora Primary Care Provider: Alla Amis Other Clinician: Referring Provider: Treating Provider/Extender: Bethena Andre Alla Amis Weeks in Treatment: 6 Information Obtained from: Patient Chief Complaint Right heel ulcer and right 2nd toe ulcer Electronic Signature(s) Signed: 12/09/2023 10:49:19 AM By: Bethena Andre PA-C Entered By: Bethena Andre on 12/09/2023 10:49:19 -------------------------------------------------------------------------------- Debridement Details Patient Name: Date of Service: Elijah Oliver Elijah SAMUEL RD K. 12/09/2023 10:30 A M Medical Record Number: [id number] Patient Account Number: [id number] Date of Birth/Sex: Treating RN: [birth year] (83 y.o. [name] Vaughn Mora Primary Care Provider: Alla Amis Other Clinician: Referring Provider: Treating Provider/Extender: Bethena Andre Alla Amis Weeks in Treatment: 6 Debridement Performed for Assessment: Wound #3 Right,Anterior T Second oe Performed By: Physician Bethena Andre, PA-C The following information was scribed by: Vaughn Mora The information was scribed for: Bethena Andre Debridement Type: Debridement Level of Consciousness (Pre-procedure): Awake and Alert Pre-procedure Verification/Time Out Yes - 11:02 Taken: Pain Control: Lidocaine  4% T opical Solution Percent of Wound Bed Debrided: 100% T Area Debrided (cm): otal 0.16 Tissue and other material debrided: Viable, Non-Viable, Subcutaneous, Skin: Dermis , Skin: Epidermis Level: Skin/Subcutaneous Tissue Debridement Description: Excisional Instrument: Curette Bleeding: Minimum Hemostasis Achieved:  Pressure Response to Treatment: Procedure was tolerated well Level of Consciousness BertieTREYVEN, LAFAUCI ([id number]) [phone].pdf Page 2 of 9 Level of Consciousness (Post- Awake and Alert procedure): Post Debridement Measurements of Total Wound Length: (cm) 0.5 Width: (cm) 0.4 Depth: (cm) 0.1 Volume: (cm) 0.016 Character of Wound/Ulcer Post Debridement: Stable Post Procedure Diagnosis Same as Pre-procedure Electronic Signature(s) Signed: 12/09/2023 3:53:53 PM By: Bethena Andre PA-C Signed: 12/09/2023 4:11:19 PM By: Vaughn Mora Entered By: Vaughn Mora on 12/09/2023 11:03:32 -------------------------------------------------------------------------------- Debridement Details Patient Name: Date of Service: Elijah Oliver Elijah SAMUEL RD K. 12/09/2023 10:30 A M Medical Record Number: [id number] Patient Account Number: [id number] Date of Birth/Sex: Treating RN: 12-20-1940 (83 y.o. [name] Vaughn Mora Primary Care Provider: Alla Amis Other Clinician: Referring Provider: Treating Provider/Extender: Bethena Andre Alla Amis Weeks in Treatment: 6 Debridement Performed for Assessment: Wound #1 Right,Medial Calcaneus Performed By: Physician Bethena Andre, PA-C The following information was scribed by: Vaughn Mora The information was scribed for: Bethena Andre Debridement Type: Debridement Severity of Tissue Pre Debridement: Limited to breakdown of skin Level of Consciousness (Pre-procedure): Awake and Alert Pre-procedure Verification/Time Out Yes - 11:04 Taken: Pain Control: Lidocaine  4% T opical Solution Percent of Wound Bed Debrided: 100% T Area Debrided (cm): otal 0.01 Tissue and other material debrided: Viable, Non-Viable, Callus Level: Non-Viable Tissue Debridement Description: Selective/Open Wound Instrument: Curette Bleeding: Minimum Hemostasis Achieved: Pressure Response to Treatment: Procedure was tolerated well Level of Consciousness  (Post- Awake and Alert procedure): Post Debridement Measurements of Total Wound Length: (cm) 0.1 Width: (cm) 0.1 Depth: (cm) 0.1 Volume: (cm) 0.001 Character of Wound/Ulcer Post Debridement: Stable Severity of Tissue Post Debridement: Limited to breakdown of skin Post Procedure Diagnosis Same as Pre-procedure Electronic Signature(s) Podesta, Singleton K ([id number]) [phone].pdf Page 3 of 9 Signed: 12/09/2023 3:53:53 PM By: Bethena Andre PA-C Signed: 12/09/2023 4:11:19 PM By: Vaughn Mora Entered By: Vaughn Mora on 12/09/2023 11:05:04 -------------------------------------------------------------------------------- HPI Details Patient Name: Date of Service: Elijah Oliver Elijah SAMUEL RD K. 12/09/2023 10:30 A M Medical Record Number: [id number] Patient Account Number:  [id number] Date of Birth/Sex: Treating RN: January 31, 1941 (83 y.o. [name] Vaughn Mora Primary Care Provider: Alla Amis Other Clinician: Referring Provider: Treating Provider/Extender: Bethena Andre Alla Amis Weeks in Treatment: 6 History of Present Illness Chronic/Inactive Conditions Condition 1: 10-22-2023 patient's ABIs on screening today were 0.93 on the left and the right and appear to be sufficient for wound healing. HPI Description: 10-22-2023 upon evaluation today patient appears to be doing somewhat poorly in regard to wounds on his heels he has 1 on the left heel which is not really doing too badly and 1 on the right heel that is a little bit more significant at this point although there is a lot of callus the wound itself does not appear to be infected and in general I do not think this is too bad overall. The patient tells me he has not really been doing anything in particular from a dressing standpoint he is just been covering it with a sock primarily. Therefore we need to get some things moving as far as his wound is concerned with regard to dressings. I did advise him he really does not  need to see podiatry as we can be taken care of the wounds and get this healed. 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 His most recent hemoglobin A1c was 6.8 and that was actually on 04-06-2023 11-02-2023 upon evaluation patient's wounds are showing signs of improvement. With that being said he is definitely getting being needed some sharp debridement today to clearway some of the necrotic debris. Fortunately I do not see any signs of active infection 11-10-2023 upon evaluation today patient appears to be doing great currently in regard to his wound which is getting very close to complete resolution. Fortunately there does not appear to be any signs of active infection at this time which is great news and in general I do believe that making good headway here towards closure which is also excellent news. I do not see any evidence of worsening overall. No fevers, chills, nausea, vomiting, or diarrhea. 11-17-2023 upon evaluation today patient appears to be doing well currently in regard to his heel he is pretty much just about done. Fortunately I do not see any signs of active infection at this time. I do think that he is making good headway here and I am very pleased with where things stand. I do not see any signs of active infection at this time. 12-01-2022 upon evaluation today patient appears to be doing well currently in regard to his wound. Has been tolerating the dressing changes without complication. He does have a new toe ulcer which unfortunately is on the right foot as well. This is more of a skin tear he tells me that he hit this on something and that is what led to the issue here. Fortunately I do not see any signs of infection at this time which is great news. I think in fact this may already be headed towards complete healing. 12-09-23 upon evaluation today patient appears to be doing well currently in regard to his heel he has been  tolerating the dressing changes and seems to be making some good progress here. Fortunately I do not see any evidence of active infection looking or systemically which is great news. Electronic Signature(s) Signed: 12/09/2023 11:10:35 AM By: Bethena Andre PA-C Entered By: Bethena Andre on 12/09/2023 11:10:34 Physical Exam Details -------------------------------------------------------------------------------- Elijah Oliver ([id number]) [id number] Page 4 of 9 Patient Name: Date of  Service: Elijah Oliver Elijah SAMUEL RD K. 12/09/2023 10:30 A M Medical Record Number: [id number] Patient Account Number: [id number] Date of Birth/Sex: Treating RN: 21-Apr-1941 (83 y.o. [name] Vaughn Mora Primary Care Provider: Alla Amis Other Clinician: Referring Provider: Treating Provider/Extender: Bethena Andre Alla Amis Weeks in Treatment: 6 Constitutional Well-nourished and well-hydrated in no acute distress. Respiratory normal breathing without difficulty. Psychiatric this patient is able to make decisions and demonstrates good insight into disease process. Alert and Oriented x 3. pleasant and cooperative. Notes Upon inspection patient's wound bed did require debridement both in regard to the toe as well as the heel. Both spots once debrided appear to be doing much better the heel just has a pinpoint area remaining open the toe is dramatically improved although the skin we will try to get it back down did not most likely due to the fact that the patient showered with a Band-Aid on and left and on all week. Electronic Signature(s) Signed: 12/09/2023 11:11:00 AM By: Bethena Andre PA-C Entered By: Bethena Andre on 12/09/2023 11:11:00 -------------------------------------------------------------------------------- Physician Orders Details Patient Name: Date of Service: Elijah Oliver Elijah SAMUEL RD K. 12/09/2023 10:30 A M Medical Record Number: [id number] Patient Account Number: [id number] Date of  Birth/Sex: Treating RN: 02-28-1941 (83 y.o. 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 Bathing/ Shower/ Hygiene May shower; gently cleanse wound with antibacterial soap, rinse and pat dry prior to dressing wounds - Recommended to use Dial original gold soap and shower on days of dressing change. No tub bath. Anesthetic (Use 'Patient Medications' Section for Anesthetic Order Entry) Lidocaine  applied to wound bed Edema Control - Orders / Instructions Elevate, Exercise Daily and Avoid Standing for Long Periods of Time. Elijah Oliver, Elijah Oliver ([id number]) [id number] Page 5 of 9 Elevate legs to the level of the heart and pump ankles as often as possible Elevate leg(s) parallel to the floor when sitting. DO YOUR BEST to sleep in the bed at night. DO NOT sleep in your recliner. Long hours of sitting in a recliner leads to swelling of the legs and/or potential wounds on your backside. Wound Treatment Wound #1 - Calcaneus Wound Laterality: Right, Medial Cleanser: Byram Ancillary Kit - 15 Day Supply (Generic) 1 x Per Day/15 Days Discharge Instructions: Use supplies as instructed; Kit contains: (15) Saline Bullets; (15) 3x3 Gauze; 15 pr Gloves Cleanser: Wound Cleanser 1 x Per Day/15 Days Discharge  Instructions: Wash your hands with soap and water. Remove old dressing, discard into plastic bag and place into trash. Cleanse the wound with Wound Cleanser prior to applying a clean dressing using gauze sponges, not tissues or cotton balls. Do not scrub or use excessive force. Pat dry using gauze sponges, not tissue or cotton balls. Peri-Wound Care: AandD Ointment 1 x Per Day/15 Days Discharge Instructions: Morning Apply AandD Ointment to periwound and wound before applying Hydrofera Blue Peri-Wound Care: Urea Cream 40%, tube, 1 (oz) 1 x Per Day/15 Days Discharge Instructions: nightly Wound #3 - T Second  oe Wound Laterality: Right, Anterior Cleanser: Soap and Water 1 x Per Day/15 Days Discharge Instructions: Gently cleanse wound with antibacterial soap, rinse and pat dry prior to dressing wounds Prim Dressing: Xeroform-HBD 2x2 (in/in) 1 x Per Day/15 Days ary Discharge Instructions: Apply Xeroform-HBD 2x2 (in/in) as directed Secondary Dressing: Coverlet Latex-Free Fabric Adhesive Dressings 1 x Per Day/15 Days Discharge Instructions: 1.5 x 2 Electronic Signature(s) Signed: 12/09/2023 3:53:53 PM By: Bethena Ferraris PA-C Signed: 12/09/2023 4:11:19 PM By: Vaughn Mora Entered By: Vaughn Mora on 12/09/2023 11:08:34 -------------------------------------------------------------------------------- Problem List Details Patient Name: Date of Service: Elijah Oliver Elijah SAMUEL RD K. 12/09/2023 10:30 A M Medical Record Number: [id number] Patient Account Number: [id number] Date of Birth/Sex: Treating RN: August 22, 1941 (83 y.o. 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 12/09/2023 10:30 A M Medical Record Number: [id number] Patient Account Number: [id number] Date of Birth/Sex: Treating RN: [birth year] (83 y.o. [name] Vaughn Mora Primary Care Provider: Alla Amis Other Clinician: Referring Provider: Treating Provider/Extender: Bethena Ferraris Alla Amis Weeks in Treatment: 6 Subjective Chief Complaint Information obtained from Patient Right heel ulcer and right 2nd toe ulcer History of Present Illness (HPI) Chronic/Inactive Condition: 10-22-2023 patient's ABIs on screening today were 0.93 on the left and the right and appear to be sufficient for wound healing. 10-22-2023 upon evaluation today patient appears to be doing somewhat poorly in regard to wounds on his heels he has 1 on the left heel which is not really doing too badly and 1 on the right heel that is a little bit more significant at this point although there is a lot of callus the wound itself does not appear to be infected and in general I do not think this is too bad overall. The patient tells me he has not really been doing anything in particular from a dressing standpoint he is just been covering it with a  sock primarily. Therefore we need to get some things moving as far as his wound is concerned with regard to dressings. I did advise him he really does not need to see podiatry as we can be taken care of the wounds and get this healed. 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 His most recent hemoglobin A1c was 6.8 and that was actually on 04-06-2023 11-02-2023 upon evaluation patient's wounds are showing signs of improvement. With that being said he is definitely getting being needed some sharp debridement today to clearway some of the necrotic debris. Fortunately I do not see any signs of active infection 11-10-2023 upon evaluation today patient appears to be doing great currently in regard to his wound which is getting  very close to complete resolution. Fortunately there does not appear to be any signs of active infection at this time which is great news and in general I do believe that making good headway here towards closure which is also excellent news. I do not see any evidence of worsening overall. No fevers, chills, nausea, vomiting, or diarrhea. 11-17-2023 upon evaluation today patient appears to be doing well currently in regard to his heel he is pretty much just about done. Fortunately I do not see any signs of active infection at this time. I do think that he is making good headway here and I am very pleased with where things stand. I do not see any signs of active infection at this time. DEWAINE, MOROCHO ([id number]) [id number] Page 7 of 9 12-01-2022 upon evaluation today patient appears to be doing well currently in regard to his wound. Has been tolerating the dressing changes without complication. He does have a new toe ulcer which unfortunately is on the right foot as well. This is more of a skin tear he tells me that he hit this on something and that is what led to the issue here. Fortunately I do not see  any signs of infection at this time which is great news. I think in fact this may already be headed towards complete healing. 12-09-23 upon evaluation today patient appears to be doing well currently in regard to his heel he has been tolerating the dressing changes and seems to be making some good progress here. Fortunately I do not see any evidence of active infection looking or systemically which is great news. Objective Constitutional Well-nourished and well-hydrated in no acute distress. Vitals Time Taken: 10:40 AM, Height: 72 in, Weight: 214 lbs, BMI: 29, Temperature: 98 F, Pulse: 68 bpm, Respiratory Rate: 18 breaths/min, Blood Pressure: 160/63 mmHg. Respiratory normal breathing without difficulty. Psychiatric this patient is able to make decisions and demonstrates good insight into disease process. Alert and Oriented x 3. pleasant and cooperative. General Notes: Upon inspection patient's wound bed did require debridement both in regard to the toe as well as the heel. Both spots once debrided appear to be doing much better the heel just has a pinpoint area remaining open the toe is dramatically improved although the skin we will try to get it back down did not most likely due to the fact that the patient showered with a Band-Aid on and left and on all week. Integumentary (Hair, Skin) Wound #1 status is Open. Original cause of wound was Blister. The date acquired was: 10/08/2023. The wound has been in treatment 6 weeks. The wound is located on the Right,Medial Calcaneus. The wound measures 0.1cm length x 0.1cm width x 0.1cm depth; 0.008cm^2 area and 0.001cm^3 volume. There is no tunneling or undermining noted. There is a medium amount of serosanguineous drainage noted. There is no granulation within the wound bed. There is no necrotic tissue within the wound bed. Wound #3 status is Open. Original cause of wound was Trauma. The date acquired was: 11/26/2023. The wound has been in treatment 1  weeks. The wound is located on the Right,Anterior T Second. The wound measures 0.5cm length x 0.4cm width x 0.1cm depth; 0.157cm^2 area and 0.016cm^3 volume. There is oe Fat Layer (Subcutaneous Tissue) exposed. There is no tunneling or undermining noted. There is a medium amount of serosanguineous drainage noted. There is medium (34-66%) red, pink granulation within the wound bed. There is a medium (34-66%) amount of necrotic tissue within the wound  bed including Adherent Slough. Assessment Active Problems ICD-10 Type 2 diabetes mellitus with foot ulcer Pressure ulcer of right heel, stage 2 Laceration without foreign body of right lesser toe(s) without damage to nail, initial encounter Type 2 diabetes mellitus with diabetic autonomic (poly)neuropathy Essential (primary) hypertension Procedures Wound #1 Pre-procedure diagnosis of Wound #1 is a Diabetic Wound/Ulcer of the Lower Extremity located on the Right,Medial Calcaneus .Severity of Tissue Pre Debridement is: Limited to breakdown of skin. There was a Selective/Open Wound Non-Viable Tissue Debridement with a total area of 0.01 sq cm performed by Bethena Ferraris, PA-C. With the following instrument(s): Curette to remove Viable and Non-Viable tissue/material. Material removed includes Callus after achieving pain control using Lidocaine  4% Topical Solution. No specimens were taken. A time out was conducted at 11:04, prior to the start of the procedure. A Minimum amount of bleeding was controlled with Pressure. The procedure was tolerated well. Post Debridement Measurements: 0.1cm length x 0.1cm width x 0.1cm depth; 0.001cm^3 volume. Character of Wound/Ulcer Post Debridement is stable. Severity of Tissue Post Debridement is: Limited to breakdown of skin. Post procedure Diagnosis Wound #1: Same as Pre-Procedure Wound #3 Pre-procedure diagnosis of Wound #3 is a Trauma, Other located on the Right,Anterior T Second . There was a Excisional  Skin/Subcutaneous Tissue oe Debridement with a total area of 0.16 sq cm performed by Bethena Ferraris, PA-C. With the following instrument(s): Curette to remove Viable and Non-Viable tissue/material. Material removed includes Subcutaneous Tissue, Skin: Dermis, and Skin: Epidermis after achieving pain control using Lidocaine  4% Topical Solution. No specimens were taken. A time out was conducted at 11:02, prior to the start of the procedure. A Minimum amount of bleeding was controlled with Pressure. The procedure was tolerated well. Post Debridement Measurements: 0.5cm length x 0.4cm width x 0.1cm depth; 0.016cm^3 volume. Character of Wound/Ulcer Post Debridement is stable. Post procedure Diagnosis Wound #3: Same as Pre-Procedure Elijah Oliver, Elijah Oliver ([id number]) [phone].pdf Page 8 of 9 Plan Follow-up Appointments: Return Appointment in 1 week. Bathing/ Shower/ Hygiene: May shower; gently cleanse wound with antibacterial soap, rinse and pat dry prior to dressing wounds - Recommended to use Dial original gold soap and shower on days of dressing change. No tub bath. Anesthetic (Use 'Patient Medications' Section for Anesthetic Order Entry): Lidocaine  applied to wound bed Edema Control - Orders / Instructions: Elevate, Exercise Daily and Avoid Standing for Long Periods of Time. Elevate legs to the level of the heart and pump ankles as often as possible Elevate leg(s) parallel to the floor when sitting. DO YOUR BEST to sleep in the bed at night. DO NOT sleep in your recliner. Long hours of sitting in a recliner leads to swelling of the legs and/or potential wounds on your backside. WOUND #1: - Calcaneus Wound Laterality: Right, Medial Cleanser: Byram Ancillary Kit - 15 Day Supply (Generic) 1 x Per Day/15 Days Discharge Instructions: Use supplies as instructed; Kit contains: (15) Saline Bullets; (15) 3x3 Gauze; 15 pr Gloves Cleanser: Wound Cleanser 1 x Per Day/15 Days Discharge  Instructions: Wash your hands with soap and water. Remove old dressing, discard into plastic bag and place into trash. Cleanse the wound with Wound Cleanser prior to applying a clean dressing using gauze sponges, not tissues or cotton balls. Do not scrub or use excessive force. Pat dry using gauze sponges, not tissue or cotton balls. Peri-Wound Care: AandD Ointment 1 x Per Day/15 Days Discharge Instructions: Morning Apply AandD Ointment to periwound and wound before applying Hydrofera Blue Peri-Wound Care: Urea Cream  40%, tube, 1 (oz) 1 x Per Day/15 Days Discharge Instructions: nightly WOUND #3: - T Second Wound Laterality: Right, Anterior oe Cleanser: Soap and Water 1 x Per Day/15 Days Discharge Instructions: Gently cleanse wound with antibacterial soap, rinse and pat dry prior to dressing wounds Prim Dressing: Xeroform-HBD 2x2 (in/in) 1 x Per Day/15 Days ary Discharge Instructions: Apply Xeroform-HBD 2x2 (in/in) as directed Secondary Dressing: Coverlet Latex-Free Fabric Adhesive Dressings 1 x Per Day/15 Days Discharge Instructions: 1.5 x 2 1. I would recommend that we have the patient going continue to monitor for any signs of infection or worsening nothing looks infected I think the heels about done I think the toe is looking better. 2. I did recommend that he should continue with the Xeroform to the toe with a coverlet. This should be changed daily and he is aware of this. 3. I am also can recommend that we continue with the AandD ointment to the heel during the day and then using the urea cream at night I think this is doing much better for him and the heel looks much more smooth without this deep crevice with a laceration of the bottom. We will see patient back for reevaluation in 1 week here in the clinic. If anything worsens or changes patient will contact our office for additional recommendations. Electronic Signature(s) Signed: 12/09/2023 11:11:37 AM By: Bethena Ferraris PA-C Entered By:  Bethena Ferraris on 12/09/2023 11:11:37 -------------------------------------------------------------------------------- SuperBill Details Patient Name: Date of Service: Elijah Oliver Elijah SAMUEL RD K. 12/09/2023 Medical Record Number: [id number] Patient Account Number: [id number] Date of Birth/Sex: Treating RN: [birth year] (83 y.o. 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 I10 Essential (primary) hypertension Facility Procedures : CPT4 Code: 63899987 Description: 11042 - DEB SUBQ TISSUE 20 SQ CM/< ICD-10 Diagnosis Description S91.114A Laceration without foreign body of right lesser toe(s) without damage to nail, init Modifier: RT ial encounter Quantity: 1 : CPT4 Code: 23899873 Description: 97597 - DEBRIDE WOUND 1ST 20 SQ CM OR < ICD-10 Diagnosis Description 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

## 2023-12-09 NOTE — Progress Notes (Signed)
 Elijah Oliver ([id number]) [id number] Page 1 of 10 Visit Report for 12/09/2023 Arrival Information Details Patient Name: Date of Service: Elijah Oliver Elijah SAMUEL RD K. 12/09/2023 10:30 A M Medical Record Number: [id number] Patient Account Number: [id number] Date of Birth/Sex: Treating RN: Apr 26, 1941 (83 y.o. Elijah Oliver Vaughn Mora Primary Care Trinka Keshishyan: Alla Amis Other Clinician: Referring Patrese Neal: Treating Kennedi Lizardo/Extender: Bethena Andre Alla Amis Devra in Treatment: 6 Visit Information History Since Last Visit Added or deleted any medications: No Patient Arrived: Ambulatory Any new allergies or adverse reactions: No Arrival Time: 10:41 Had a fall or experienced change in No Accompanied By: self activities of daily living that may affect Transfer Assistance: None risk of falls: Patient Identification Verified: Yes Hospitalized since last visit: No Secondary Verification Process Completed: Yes Has Dressing in Place as Prescribed: Yes Patient Requires Transmission-Based Precautions: No Pain Present Now: No Patient Has Alerts: Yes Patient Alerts: Patient on Blood Thinner type 2 diabetic Electronic Signature(s) Signed: 12/09/2023 4:11:19 PM By: Vaughn Mora Entered By: Vaughn Mora on 12/09/2023 10:42:16 -------------------------------------------------------------------------------- Clinic Level of Care Assessment Details Patient Name: Date of Service: Elijah Oliver Elijah SAMUEL RD K. 12/09/2023 10:30 A M Medical Record Number: [id number] Patient Account Number: [id number] Date of Birth/Sex: Treating RN: 05-08-1941 (83 y.o. Elijah Oliver Vaughn Mora Primary Care Kalise Fickett: Alla Amis Other Clinician: Referring Tinea Nobile: Treating Elijah Oliver/Extender: Bethena Andre Alla Amis Devra in Treatment: 6 Clinic Level of Care Assessment Items TOOL 1 Quantity Score []  - 0 Use when EandM and Procedure is performed on INITIAL visit ASSESSMENTS - Nursing  Assessment / Reassessment []  - 0 General Physical Exam (combine w/ comprehensive assessment (listed just below) when performed on new pt. evals) []  - 0 Comprehensive Assessment (HX, ROS, Risk Assessments, Wounds Hx, etc.) ASSESSMENTS - Wound and Skin Assessment / Reassessment []  - 0 Dermatologic / Skin Assessment (not related to wound area) Elijah Oliver ([id number]) [id number] Page 2 of 10 ASSESSMENTS - Ostomy and/or Continence Assessment and Care []  - 0 Incontinence Assessment and Management []  - 0 Ostomy Care Assessment and Management (repouching, etc.) PROCESS - Coordination of Care []  - 0 Simple Patient / Family Education for ongoing care []  - 0 Complex (extensive) Patient / Family Education for ongoing care []  - 0 Staff obtains Chiropractor, Records, T Results / Process Orders est []  - 0 Staff telephones HHA, Nursing Homes / Clarify orders / etc []  - 0 Routine Transfer to another Facility (non-emergent condition) []  - 0 Routine Hospital Admission (non-emergent condition) []  - 0 New Admissions / Manufacturing Engineer / Ordering NPWT Apligraf, etc. , []  - 0 Emergency Hospital Admission (emergent condition) PROCESS - Special Needs []  - 0 Pediatric / Minor Patient Management []  - 0 Isolation Patient Management []  - 0 Hearing / Language / Visual special needs []  - 0 Assessment of Community assistance (transportation, D/C planning, etc.) []  - 0 Additional assistance / Altered mentation []  - 0 Support Surface(s) Assessment (bed, cushion, seat, etc.) INTERVENTIONS - Miscellaneous []  - 0 External ear exam []  - 0 Patient Transfer (multiple staff / Nurse, Adult / Similar devices) []  - 0 Simple Staple / Suture removal (25 or less) []  - 0 Complex Staple / Suture removal (26 or more) []  - 0 Hypo/Hyperglycemic Management (do not check if billed separately) []  - 0 Ankle / Brachial Index (ABI) - do not check if billed separately Has the  patient been seen at the hospital within the last three years: Yes Total Score: 0 Level Of Care: ____ Electronic Signature(s) Signed: 12/09/2023 4:11:19  PM By: Vaughn Mora Entered By: Gordon, Caitlin on 12/09/2023 11:08:39 -------------------------------------------------------------------------------- Encounter Discharge Information Details Patient Name: Date of Service: Elijah Oliver Elijah SAMUEL RD K. 12/09/2023 10:30 A M Medical Record Number: [id number] Patient Account Number: [id number] Date of Birth/Sex: Treating RN: 1941-09-17 (83 y.o. Elijah Oliver Vaughn Mora Primary Care Nyala Kirchner: Alla Amis Other Clinician: Referring Deshaun Weisinger: Treating Elijah Oliver/Extender: Bethena Andre Alla Amis Devra in Treatment: 6 Encounter Discharge Information Items Post Procedure Elijah Oliver ([id number]) [id number] Page 3 of 10 Discharge Condition: Stable Temperature (F): 98 Ambulatory Status: Ambulatory Pulse (bpm): 68 Discharge Destination: Home Respiratory Rate (breaths/min): 18 Transportation: Private Auto Blood Pressure (mmHg): 160/63 Accompanied By: self Schedule Follow-up Appointment: Yes Clinical Summary of Care: Electronic Signature(s) Signed: 12/09/2023 4:11:19 PM By: Vaughn Mora Entered By: Vaughn Mora on 12/09/2023 11:09:50 -------------------------------------------------------------------------------- Lower Extremity Assessment Details Patient Name: Date of Service: Elijah Oliver Elijah SAMUEL RD K. 12/09/2023 10:30 A M Medical Record Number: [id number] Patient Account Number: [id number] Date of Birth/Sex: Treating RN: [birth year] (83 y.o. Elijah Oliver Vaughn Mora Primary Care Kevaughn Ewing: Alla Amis Other Clinician: Referring Elijah Oliver: Treating Elijah Oliver/Extender: Bethena Andre Alla Amis Weeks in Treatment: 6 Edema Assessment Assessed: [Left: No] [Right: No] Edema: [Left: N] [Right: o] Vascular Assessment Pulses: Dorsalis Pedis Palpable:  [Right:Yes] Extremity colors, hair growth, and conditions: Extremity Color: [Right:Normal] Hair Growth on Extremity: [Right:No] Temperature of Extremity: [Right:Cool < 3 seconds] Toe Nail Assessment Left: Right: Thick: Yes Discolored: Yes Deformed: No Improper Length and Hygiene: No Electronic Signature(s) Signed: 12/09/2023 4:11:19 PM By: Vaughn Mora Entered By: Vaughn Mora on 12/09/2023 10:48:27 WADDELL CHARLIE MARLA ([id number]) [id number] Page 4 of 10 -------------------------------------------------------------------------------- Multi Wound Chart Details Patient Name: Date of Service: Elijah Oliver Elijah SAMUEL RD K. 12/09/2023 10:30 A M Medical Record Number: [id number] Patient Account Number: [id number] Date of Birth/Sex: Treating RN: 03-Jun-1941 (83 y.o. Elijah Oliver Vaughn Mora Primary Care Craig Ionescu: Alla Amis Other Clinician: Referring Camden Knotek: Treating Jah Alarid/Extender: Bethena Andre Alla Amis Weeks in Treatment: 6 Vital Signs Height(in): 72 Pulse(bpm): 68 Weight(lbs): 214 Blood Pressure(mmHg): 160/63 Body Mass Index(BMI): 29 Temperature(F): 98 Respiratory Rate(breaths/min): 18 [1:Photos:] [N/A:N/A] Right, Medial Calcaneus Right, Anterior T Second oe N/A Wound Location: Blister Trauma N/A Wounding Event: Diabetic Wound/Ulcer of the Lower Trauma, Other N/A Primary Etiology: Extremity Glaucoma, Sleep Apnea, Coronary Glaucoma, Sleep Apnea, Coronary N/A Comorbid History: Artery Disease, Hypertension, Type II Artery Disease, Hypertension, Type II Diabetes, End Stage Renal Disease, Diabetes, End Stage Renal Disease, Neuropathy, Seizure Disorder Neuropathy, Seizure Disorder 10/08/2023 11/26/2023 N/A Date Acquired: 6 1 N/A Weeks of Treatment: Open Open N/A Wound Status: No No N/A Wound Recurrence: 0.1x0.1x0.1 0.5x0.4x0.1 N/A Measurements L x W x D (cm) 0.008 0.157 N/A A (cm) : rea 0.001 0.016 N/A Volume (cm) : 100.00% 33.50%  N/A % Reduction in Area: 99.90% 33.30% N/A % Reduction in Volume: Grade 2 Full Thickness Without Exposed N/A Classification: Support Structures Medium Medium N/A Exudate Amount: Serosanguineous Serosanguineous N/A Exudate Type: red, brown red, brown N/A Exudate Color: None Present (0%) Medium (34-66%) N/A Granulation Amount: N/A Red, Pink N/A Granulation Quality: None Present (0%) Medium (34-66%) N/A Necrotic Amount: Fat Layer (Subcutaneous Tissue): No Fat Layer (Subcutaneous Tissue): Yes N/A Exposed Structures: Fascia: No Tendon: No Muscle: No Joint: No Bone: No None None N/A Epithelialization: Treatment Notes Electronic Signature(s) Signed: 12/09/2023 4:11:19 PM By: Vaughn Mora Entered By: Vaughn Mora on 12/09/2023 10:48:46 WADDELL CHARLIE MARLA ([id number]) [id number] Page 5 of 10 -------------------------------------------------------------------------------- Multi-Disciplinary Care Plan Details Patient Name: Date of Service: TA Oliver Elijah SAMUEL RD K.  12/09/2023 10:30 A M Medical Record Number: [id number] Patient Account Number: [id number] Date of Birth/Sex: Treating RN: 01/22/1941 (83 y.o. Elijah Oliver Vaughn Mora Primary Care Liberta Gimpel: Alla Amis Other Clinician: Referring Elwood Bazinet: Treating Keenan Trefry/Extender: Bethena Andre Alla Amis Devra in Treatment: 6 Active Inactive Necrotic Tissue Nursing Diagnoses: Impaired tissue integrity related to necrotic/devitalized tissue Knowledge deficit related to management of necrotic/devitalized tissue Goals: Necrotic/devitalized tissue will be minimized in the wound bed Date Initiated: 10/22/2023 Target Resolution Date: 12/17/2023 Goal Status: Active Patient/caregiver will verbalize understanding of reason and process for debridement of necrotic tissue Date Initiated: 10/22/2023 Date Inactivated: 10/22/2023 Target Resolution Date: 10/22/2023 Goal Status: Met Interventions: Assess  patient pain level pre-, during and post procedure and prior to discharge Provide education on necrotic tissue and debridement process Treatment Activities: Apply topical anesthetic as ordered : 10/22/2023 Excisional debridement : 10/22/2023 Notes: Wound/Skin Impairment Nursing Diagnoses: Impaired tissue integrity Knowledge deficit related to ulceration/compromised skin integrity Goals: Ulcer/skin breakdown will have a volume reduction of 30% by week 4 Date Initiated: 10/22/2023 Date Inactivated: 12/02/2023 Target Resolution Date: 11/19/2023 Goal Status: Met Ulcer/skin breakdown will have a volume reduction of 50% by week 8 Date Initiated: 10/22/2023 Target Resolution Date: 12/17/2023 Goal Status: Active Ulcer/skin breakdown will have a volume reduction of 80% by week 12 Date Initiated: 10/22/2023 Target Resolution Date: 01/14/2024 Goal Status: Active Ulcer/skin breakdown will heal within 14 weeks Date Initiated: 10/22/2023 Target Resolution Date: 01/28/2024 Goal Status: Active Interventions: Assess patient/caregiver ability to obtain necessary supplies Assess patient/caregiver ability to perform ulcer/skin care regimen upon admission and as needed Assess ulceration(s) every visit Provide education on ulcer and skin care SETH, HIGGINBOTHAM ([id number]) [phone].pdf Page 6 of 10 Notes: Electronic Signature(s) Signed: 12/09/2023 4:11:19 PM By: Vaughn Mora Entered By: Vaughn Mora on 12/09/2023 11:09:01 -------------------------------------------------------------------------------- Pain Assessment Details Patient Name: Date of Service: Elijah Oliver Elijah SAMUEL RD K. 12/09/2023 10:30 A M Medical Record Number: [id number] Patient Account Number: [id number] Date of Birth/Sex: Treating RN: [birth year] (83 y.o. Elijah Oliver Vaughn Mora Primary Care Shakaya Bhullar: Alla Amis Other Clinician: Referring Tiyona Desouza: Treating Beretta Ginsberg/Extender: Bethena Andre Alla Amis Weeks in Treatment: 6 Active Problems Location of Pain Severity and Description of Pain Patient Has Paino No Site Locations Pain Management and Medication Current Pain Management: Electronic Signature(s) Signed: 12/09/2023 4:11:19 PM By: Vaughn Mora Entered By: Gordon, Caitlin on 12/09/2023 10:42:41 WADDELL CHARLIE POUR ([id number]) [id number] Page 7 of 10 -------------------------------------------------------------------------------- Patient/Caregiver Education Details Patient Name: Date of Service: TA Oliver Elijah SAMUEL RD K. 1/9/2025andnbsp10:30 A M Medical Record Number: [id number] Patient Account Number: [id number] Date of Birth/Gender: Treating RN: February 08, 1941 (83 y.o. Elijah Oliver Vaughn Mora Primary Care Physician: Alla Amis Other Clinician: Referring Physician: Treating Physician/Extender: Bethena Andre Alla Amis Devra in Treatment: 6 Education Assessment Education Provided To: Patient Education Topics Provided Wound Debridement: Handouts: Wound Debridement Methods: Explain/Verbal Responses: State content correctly Wound/Skin Impairment: Handouts: Caring for Your Ulcer Methods: Explain/Verbal Responses: State content correctly Electronic Signature(s) Signed: 12/09/2023 4:11:19 PM By: Vaughn Mora Entered By: Vaughn Mora on 12/09/2023 11:09:13 -------------------------------------------------------------------------------- Wound Assessment Details Patient Name: Date of Service: Elijah Oliver Elijah SAMUEL RD K. 12/09/2023 10:30 A M Medical Record Number: [id number] Patient Account Number: [id number] Date of Birth/Sex: Treating RN: 20-May-1941 (83 y.o. Elijah Oliver Vaughn Mora Primary Care Ahsley Attwood: Alla Amis Other Clinician: Referring Shawneen Deetz: Treating Rushi Chasen/Extender: Bethena Andre Alla Amis Weeks in Treatment: 6 Wound Status Wound Number: 1 Primary Diabetic Wound/Ulcer of the Lower Extremity Etiology: Wound Location:  Right, Medial Calcaneus Wound Open Wounding Event: Blister Status: Date Acquired:  10/08/2023 Comorbid Glaucoma, Sleep Apnea, Coronary Artery Disease, Hypertension, Weeks Of Treatment: 6 History: Type II Diabetes, End Stage Renal Disease, Neuropathy, Seizure Clustered Wound: No Disorder Photos LIBRADO, GUANDIQUE ([id number]) [id number] Page 8 of 10 Wound Measurements Length: (cm) 0.1 Width: (cm) 0.1 Depth: (cm) 0.1 Area: (cm) 0.008 Volume: (cm) 0.001 % Reduction in Area: 100% % Reduction in Volume: 99.9% Epithelialization: None Tunneling: No Undermining: No Wound Description Classification: Grade 2 Exudate Amount: Medium Exudate Type: Serosanguineous Exudate Color: red, brown Foul Odor After Cleansing: No Slough/Fibrino Yes Wound Bed Granulation Amount: None Present (0%) Exposed Structure Necrotic Amount: None Present (0%) Fat Layer (Subcutaneous Tissue) Exposed: No Treatment Notes Wound #1 (Calcaneus) Wound Laterality: Right, Medial Cleanser [place] Ancillary Kit - 15 Day Supply Discharge Instruction: Use supplies as instructed; Kit contains: (15) Saline Bullets; (15) 3x3 Gauze; 15 pr Gloves Wound Cleanser Discharge Instruction: Wash your hands with soap and water. Remove old dressing, discard into plastic bag and place into trash. Cleanse the wound with Wound Cleanser prior to applying a clean dressing using gauze sponges, not tissues or cotton balls. Do not scrub or use excessive force. Pat dry using gauze sponges, not tissue or cotton balls. Peri-Wound Care AandD Ointment Discharge Instruction: Morning Apply AandD Ointment to periwound and wound before applying Hydrofera Blue Urea Cream 40%, tube, 1 (oz) Discharge Instruction: nightly Topical Primary Dressing Secondary Dressing Secured With Compression Wrap Compression Stockings Add-Ons Electronic Signature(s) Signed: 12/09/2023 4:11:19 PM By: Vaughn Mora Entered By: Gordon, Caitlin  on 12/09/2023 10:47:35 Schlee, Dartagnan K ([id number]) [id number] Page 9 of 10 -------------------------------------------------------------------------------- Wound Assessment Details Patient Name: Date of Service: Elijah Oliver Elijah SAMUEL RD K. 12/09/2023 10:30 A M Medical Record Number: [id number] Patient Account Number: [id number] Date of Birth/Sex: Treating RN: [birth year] (82 y.o. Elijah Oliver Vaughn Mora Primary Care Talitha Dicarlo: Alla Amis Other Clinician: Referring Naiah Donahoe: Treating Zurii Hewes/Extender: Bethena Andre Alla Amis Weeks in Treatment: 6 Wound Status Wound Number: 3 Primary Trauma, Other Etiology: Wound Location: Right, Anterior T Second oe Wound Open Wounding Event: Trauma Status: Date Acquired: 11/26/2023 Comorbid Glaucoma, Sleep Apnea, Coronary Artery Disease, Hypertension, Weeks Of Treatment: 1 History: Type II Diabetes, End Stage Renal Disease, Neuropathy, Seizure Clustered Wound: No Disorder Photos Wound Measurements Length: (cm) 0.5 Width: (cm) 0.4 Depth: (cm) 0.1 Area: (cm) 0.157 Volume: (cm) 0.016 % Reduction in Area: 33.5% % Reduction in Volume: 33.3% Epithelialization: None Tunneling: No Undermining: No Wound Description Classification: Full Thickness Without Exposed Suppor Exudate Amount: Medium Exudate Type: Serosanguineous Exudate Color: red, brown t Structures Foul Odor After Cleansing: No Slough/Fibrino No Wound Bed Granulation Amount: Medium (34-66%) Exposed Structure Granulation Quality: Red, Pink Fascia Exposed: No Necrotic Amount: Medium (34-66%) Fat Layer (Subcutaneous Tissue) Exposed: Yes Necrotic Quality: Adherent Slough Tendon Exposed: No Muscle Exposed: No Joint Exposed: No Bone Exposed: No Treatment Notes Wound #3 (Toe Second) Wound Laterality: Right, Anterior Cleanser Soap and Water Discharge Instruction: Gently cleanse wound with antibacterial soap, rinse and pat dry prior to dressing  wounds ANDRUS, SHARP ([id number]) [phone].pdf Page 10 of 10 Peri-Wound Care Topical Primary Dressing Xeroform-HBD 2x2 (in/in) Discharge Instruction: Apply Xeroform-HBD 2x2 (in/in) as directed Secondary Dressing Coverlet Latex-Free Fabric Adhesive Dressings Discharge Instruction: 1.5 x 2 Secured With Compression Wrap Compression Stockings Add-Ons Electronic Signature(s) Signed: 12/09/2023 4:11:19 PM By: Vaughn Mora Entered By: Gordon, Caitlin on 12/09/2023 10:48:04 -------------------------------------------------------------------------------- Vitals Details Patient Name: Date of Service: Elijah Oliver Elijah SAMUEL RD K. 12/09/2023 10:30 A M Medical Record Number: [id number] Patient Account Number: [id number] Date of Birth/Sex:  Treating RN: 1941-04-04 (83 y.o. Elijah Oliver Vaughn Mora Primary Care Mehran Guderian: Alla Amis Other Clinician: Referring Criag Wicklund: Treating Stacy Deshler/Extender: Bethena Andre Alla Amis Weeks in Treatment: 6 Vital Signs Time Taken: 10:40 Temperature (F): 98 Height (in): 72 Pulse (bpm): 68 Weight (lbs): 214 Respiratory Rate (breaths/min): 18 Body Mass Index (BMI): 29 Blood Pressure (mmHg): 160/63 Reference Range: 80 - 120 mg / dl Electronic Signature(s) Signed: 12/09/2023 4:11:19 PM By: Vaughn Mora Entered By: Vaughn Mora on 12/09/2023 10:42:36

## 2023-12-16 ENCOUNTER — Encounter: Payer: Medicare Other | Admitting: Physician Assistant

## 2023-12-16 DIAGNOSIS — E11621 Type 2 diabetes mellitus with foot ulcer: Secondary | ICD-10-CM | POA: Diagnosis not present

## 2023-12-17 ENCOUNTER — Encounter: Payer: Self-pay | Admitting: Podiatry

## 2023-12-17 NOTE — Progress Notes (Signed)
[name], [name] ([id number]) [id number] Page 1 of 6 Visit Report for 12/16/2023 Chief Complaint Document Details Patient Name: Date of Service: Elijah Oliver RD K. 12/16/2023 12:30 PM Medical Record Number: [id number] Patient Account Number: [id number] Date of Birth/Sex: Treating RN: 1941/04/19 (83 y.o. Elijah Oliver) [name] Primary Care Provider: Marisue Ivan Other Clinician: Referring Provider: Treating Provider/Extender: Maxwell Caul Weeks in Treatment: 7 Information Obtained from: Patient Chief Complaint Right heel ulcer and right 2nd toe ulcer Electronic Signature(s) Signed: 12/16/2023 1:19:11 PM By: Allen Derry PA-C Entered By: Allen Derry on 12/16/2023 13:19:11 -------------------------------------------------------------------------------- HPI Details Patient Name: Date of Service: Elijah Oliver RD K. 12/16/2023 12:30 PM Medical Record Number: [id number] Patient Account Number: [id number] Date of Birth/Sex: Treating RN: 1941/08/11 (83 y.o. Elijah Oliver Primary Care Provider: Marisue Ivan Other Clinician: Referring Provider: Treating Provider/Extender: Maxwell Caul Weeks in Treatment: 7 History of Present Illness Chronic/Inactive Conditions Condition 1: 10-22-2023 patient's ABIs on screening today were 0.93 on the left and the right and appear to be sufficient for wound healing. HPI Description: 10-22-2023 upon evaluation today patient appears to be doing somewhat poorly in regard to wounds on his heels he has 1 on the left heel which is not really doing too badly and 1 on the right heel that is a little bit more significant at this point although there is a lot of callus the wound itself does not appear to be infected and in general I do not think this is too bad overall. The patient tells me he has not really been doing anything in particular from a dressing standpoint he is just been covering it with a  sock primarily. Therefore we need to get some things moving as far as his wound is concerned with regard to dressings. I did advise him he really does not need to see podiatry as we can be taken care of the wounds and get this healed. 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 His most recent hemoglobin A1c was 6.8 and that was actually on 04-06-2023 11-02-2023 upon evaluation patient's wounds are showing signs of improvement. With that being said he is definitely getting being needed some sharp debridement today to clearway some of the necrotic debris. Fortunately I do not see any signs of active infection 11-10-2023 upon evaluation today patient appears to be doing great currently in regard to his wound which is getting very close to complete resolution. Fortunately there does not appear to be any signs of active infection at this time which is great news and in general I do believe that making good headway here towards closure which is also excellent news. I do not see any evidence of worsening overall. No fevers, chills, nausea, vomiting, or diarrhea. CHANCY, BOLMAN ([id number]) [id number] Page 2 of 6 11-17-2023 upon evaluation today patient appears to be doing well currently in regard to his heel he is pretty much just about done. Fortunately I do not see any signs of active infection at this time. I do think that he is making good headway here and I am very pleased with where things stand. I do not see any signs of active infection at this time. 12-01-2022 upon evaluation today patient appears to be doing well currently in regard to his wound. Has been tolerating the dressing changes without complication. He does have a new toe ulcer which unfortunately is on the right foot as well. This is more of  a skin tear he tells me that he hit this on something and that is what led to the issue here. Fortunately I do not see  any signs of infection at this time which is great news. I think in fact this may already be headed towards complete healing. 12-09-23 upon evaluation today patient appears to be doing well currently in regard to his heel he has been tolerating the dressing changes and seems to be making some good progress here. Fortunately I do not see any evidence of active infection looking or systemically which is great news. 12-16-2023 upon evaluation today patient appears to be doing well currently in regard to his wounds in fact he appears to be completely healed based on what I am seeing. I do not see any evidence of active infection at this time. Electronic Signature(s) Signed: 12/16/2023 5:00:09 PM By: Allen Derry PA-C Entered By: Allen Derry on 12/16/2023 17:00:09 -------------------------------------------------------------------------------- Physical Exam Details Patient Name: Date of Service: Elijah Oliver RD K. 12/16/2023 12:30 PM Medical Record Number: [id number] Patient Account Number: [id number] Date of Birth/Sex: Treating RN: 1941/02/23 (83 y.o. Elijah Oliver Primary Care Provider: Marisue Ivan Other Clinician: Referring Provider: Treating Provider/Extender: Maxwell Caul Weeks in Treatment: 7 Constitutional Well-nourished and well-hydrated in no acute distress. Respiratory normal breathing without difficulty. Psychiatric this patient is able to make decisions and demonstrates good insight into disease process. Alert and Oriented x 3. pleasant and cooperative. Notes Upon inspection patient's wound showed complete epithelization at all locations and he overall seems to be doing excellent not see anything that is of concern at this point. I do Electronic Signature(s) Signed: 12/16/2023 5:00:20 PM By: Allen Derry PA-C Entered By: Allen Derry on 12/16/2023 17:00:20 -------------------------------------------------------------------------------- Physician Orders  Details Patient Name: Date of Service: Elijah Oliver RD K. 12/16/2023 12:30 PM Durene Cal ([id number]) [id number] Page 3 of 6 Medical Record Number: [id number] Patient Account Number: [id number] Date of Birth/Sex: Treating RN: [birth year] (82 y.o. Elijah Oliver) [name] Primary Care Provider: Marisue Ivan Other Clinician: Referring Provider: Treating Provider/Extender: Rebecka Apley in Treatment: 7 The following information was scribed by: [name] The information was scribed for: Allen Derry Verbal / Phone Orders: No Diagnosis Coding Discharge From [facility] Discharge from Wound Care Center Treatment Complete - paint 2nd with betadine until scab falls off, apply urea cream at night and AandD ointment in the am to right calcaneus, may start using tread mill in 2 weeks, may soak feet in 2 weeks Electronic Signature(s) Signed: 12/16/2023 6:05:15 PM By: Allen Derry PA-C Signed: 12/17/2023 8:18:57 AM By: [name] RN Entered By: [name] on 12/16/2023 13:18:09 -------------------------------------------------------------------------------- Problem List Details Patient Name: Date of Service: Elijah Oliver RD K. 12/16/2023 12:30 PM Medical Record Number: [id number] Patient Account Number: [id number] Date of Birth/Sex: Treating RN: [birth year] (82 y.o. Elijah Oliver Primary Care Provider: Marisue Ivan Other Clinician: Referring Provider: Treating Provider/Extender: Maxwell Caul Weeks in Treatment: 7 Active Problems ICD-10 Encounter Code Description Active Date MDM Diagnosis E11.621 Type 2 diabetes mellitus with foot ulcer 10/22/2023 No Yes L89.612 Pressure ulcer of right heel, stage 2 10/22/2023 No Yes S91.114A Laceration without foreign body of right lesser toe(s) without damage to nail, 12/02/2023 No Yes initial encounter E11.43 Type 2 diabetes mellitus with diabetic autonomic  (poly)neuropathy 10/22/2023 No Yes I10 Essential (primary) hypertension 10/22/2023 No Yes Inactive Problems Resolved Problems DADE, KRUPNICK ([id number]) [phone].pdf Page 4 of 6 ICD-10 Code  Description Active Date Resolved Date L89.622 Pressure ulcer of left heel, stage 2 10/22/2023 10/22/2023 Electronic Signature(s) Signed: 12/16/2023 1:19:06 PM By: Allen Derry PA-C Entered By: Allen Derry on 12/16/2023 13:19:06 -------------------------------------------------------------------------------- Progress Note Details Patient Name: Date of Service: Elijah Oliver RD K. 12/16/2023 12:30 PM Medical Record Number: [id number] Patient Account Number: [id number] Date of Birth/Sex: Treating RN: Apr 07, 1941 (82 y.o. Elijah Oliver) [name] Primary Care Provider: Marisue Ivan Other Clinician: Referring Provider: Treating Provider/Extender: Maxwell Caul Weeks in Treatment: 7 Subjective Chief Complaint Information obtained from Patient Right heel ulcer and right 2nd toe ulcer History of Present Illness (HPI) Chronic/Inactive Condition: 10-22-2023 patient's ABIs on screening today were 0.93 on the left and the right and appear to be sufficient for wound healing. 10-22-2023 upon evaluation today patient appears to be doing somewhat poorly in regard to wounds on his heels he has 1 on the left heel which is not really doing too badly and 1 on the right heel that is a little bit more significant at this point although there is a lot of callus the wound itself does not appear to be infected and in general I do not think this is too bad overall. The patient tells me he has not really been doing anything in particular from a dressing standpoint he is just been covering it with a sock primarily. Therefore we need to get some things moving as far as his wound is concerned with regard to dressings. I did advise him he really does not need to see podiatry as we can be  taken care of the wounds and get this healed. 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 His most recent hemoglobin A1c was 6.8 and that was actually on 04-06-2023 11-02-2023 upon evaluation patient's wounds are showing signs of improvement. With that being said he is definitely getting being needed some sharp debridement today to clearway some of the necrotic debris. Fortunately I do not see any signs of active infection 11-10-2023 upon evaluation today patient appears to be doing great currently in regard to his wound which is getting very close to complete resolution. Fortunately there does not appear to be any signs of active infection at this time which is great news and in general I do believe that making good headway here towards closure which is also excellent news. I do not see any evidence of worsening overall. No fevers, chills, nausea, vomiting, or diarrhea. 11-17-2023 upon evaluation today patient appears to be doing well currently in regard to his heel he is pretty much just about done. Fortunately I do not see any signs of active infection at this time. I do think that he is making good headway here and I am very pleased with where things stand. I do not see any signs of active infection at this time. 12-01-2022 upon evaluation today patient appears to be doing well currently in regard to his wound. Has been tolerating the dressing changes without complication. He does have a new toe ulcer which unfortunately is on the right foot as well. This is more of a skin tear he tells me that he hit this on something and that is what led to the issue here. Fortunately I do not see any signs of infection at this time which is great news. I think in fact this may already be headed towards complete healing. 12-09-23 upon evaluation today patient appears to be doing well currently in regard to his  heel he has been tolerating the dressing changes and seems  to be making some good progress here. Fortunately I do not see any evidence of active infection looking or systemically which is great news. 12-16-2023 upon evaluation today patient appears to be doing well currently in regard to his wounds in fact he appears to be completely healed based on what I am seeing. I do not see any evidence of active infection at this time. DENARIUS, SZEKELY ([id number]) [id number] Page 5 of 6 Objective Constitutional Well-nourished and well-hydrated in no acute distress. Vitals Time Taken: 12:48 PM, Height: 72 in, Weight: 214 lbs, BMI: 29, Temperature: 97.8 F, Pulse: 62 bpm, Respiratory Rate: 18 breaths/min, Blood Pressure: 136/78 mmHg. Respiratory normal breathing without difficulty. Psychiatric this patient is able to make decisions and demonstrates good insight into disease process. Alert and Oriented x 3. pleasant and cooperative. General Notes: Upon inspection patient's wound showed complete epithelization at all locations and he overall seems to be doing excellent not see anything that is of concern at this point. I do Integumentary (Hair, Skin) Wound #1 status is Healed - Epithelialized. Original cause of wound was Blister. The date acquired was: 10/08/2023. The wound has been in treatment 7 weeks. The wound is located on the Right,Medial Calcaneus. The wound measures 0cm length x 0cm width x 0cm depth; 0cm^2 area and 0cm^3 volume. There is no tunneling or undermining noted. There is a none present amount of drainage noted. There is no granulation within the wound bed. There is no necrotic tissue within the wound bed. Wound #3 status is Healed - Epithelialized. Original cause of wound was Trauma. The date acquired was: 11/26/2023. The wound has been in treatment 2 weeks. The wound is located on the Right,Anterior T Second. The wound measures 0cm length x 0cm width x 0cm depth; 0cm^2 area and 0cm^3 volume. oe There is no tunneling  or undermining noted. There is a none present amount of drainage noted. There is no granulation within the wound bed. There is no necrotic tissue within the wound bed. Assessment Active Problems ICD-10 Type 2 diabetes mellitus with foot ulcer Pressure ulcer of right heel, stage 2 Laceration without foreign body of right lesser toe(s) without damage to nail, initial encounter Type 2 diabetes mellitus with diabetic autonomic (poly)neuropathy Essential (primary) hypertension Plan Discharge From [facility] Services: Discharge from Wound Care Center Treatment Complete - paint 2nd with betadine until scab falls off, apply urea cream at night and AandD ointment in the am to right calcaneus, may start using tread mill in 2 weeks, may soak feet in 2 weeks 1. I am going to recommend that we have the patient continue to use an AandD ointment over the area and that he is doing this during the morning time and that at night he is using the urea cream 40% which I think is doing quite well. 2. Also can recommend that he continue to monitor the wounds to make sure nothing worsens and if anything changes he knows contact the office and let me know. We will see patient back for reevaluation in 1 week here in the clinic. If anything worsens or changes patient will contact our office for additional recommendations. Electronic Signature(s) Signed: 12/16/2023 5:00:58 PM By: Allen Derry PA-C Entered By: Allen Derry on 12/16/2023 17:00:58 Durene Cal ([id number]) [id number] Page 6 of 6 -------------------------------------------------------------------------------- SuperBill Details Patient Name: Date of Service: Elijah Oliver RD K. 12/16/2023 Medical Record Number: [id number] Patient Account Number: [id number] Date  of Birth/Sex: Treating RN: 08/24/1941 (82 y.o. Elijah Oliver) [name] Primary Care Provider: Marisue Ivan Other Clinician: Referring Provider: Treating Provider/Extender:  Maxwell Caul Weeks in Treatment: 7 Diagnosis Coding ICD-10 Codes Code Description E11.621 Type 2 diabetes mellitus with foot ulcer L89.612 Pressure ulcer of right heel, stage 2 S91.114A Laceration without foreign body of right lesser toe(s) without damage to nail, initial encounter E11.43 Type 2 diabetes mellitus with diabetic autonomic (poly)neuropathy I10 Essential (primary) hypertension Physician Procedures : CPT4 Code Description Modifier [id number] - WC PHYS LEVEL 3 - EST PT ICD-10 Diagnosis Description E11.621 Type 2 diabetes mellitus with foot ulcer L89.612 Pressure ulcer of right heel, stage 2 S91.114A Laceration without foreign body of right lesser  toe(s) without damage to nail, initial encounter E11.43 Type 2 diabetes mellitus with diabetic autonomic (poly)neuropathy Quantity: 1 Electronic Signature(s) Signed: 12/16/2023 5:01:33 PM By: Allen Derry PA-C Entered By: Allen Derry on 12/16/2023 17:01:32

## 2023-12-17 NOTE — Progress Notes (Signed)
  Subjective:  Patient ID: Elijah Oliver, male    DOB: 1941-07-06,  MRN: [id number]  Elijah Oliver presents to clinic today for at risk foot care with history of diabetic neuropathy and painful elongated mycotic toenails 1-5 bilaterally which are tender when wearing enclosed shoe gear. Pain is relieved with periodic professional debridement.   Patient is seeing Wound Care for wound on right 2nd digit and right heel.   Chief Complaint  Patient presents with   Nail Problem    Pt is here for [facility] last A1C was 7 PCP is Dr Burnadette Pop and LOV was in December.    PCP is Marisue Ivan, MD.  Allergies  Allergen Reactions   Other Anaphylaxis    Oriental food- anaphylaxis    Review of Systems: Negative except as noted in the HPI.  Objective: No changes noted in today's physical examination. There were no vitals filed for this visit. Elijah Oliver is a pleasant 83 y.o. male in NAD. AAO x 3.  Vascular Examination: Vascular status intact b/l with palpable pedal pulses. CFT <3 seconds.  Pedal hair diminished. No edema. No pain with calf compression b/l. Skin temperature gradient WNL b/l. No varicosities noted. No cyanosis or clubbing noted.  Neurological Examination: Protective sensation diminished with 10g monofilament b/l.  Dermatological Examination: Pedal skin with normal turgor, texture and tone b/l. Dressing clean, dry and intact to right 2nd toe. Heel fissuring noted posterior aspect right heel No erythema, no edema, no drainage, no fluctuance. Toenails 1-5 b/l thick, discolored, elongated with subungual debris and pain on dorsal palpation.   Musculoskeletal Examination: Muscle strength 5/5 to b/l LE.  No pain, crepitus noted b/l. Hammertoe deformity noted 1-5 b/l  Assessment/Plan: 1. Pain due to onychomycosis of toenails of both feet   2. Diabetic polyneuropathy associated with type 2 diabetes mellitus (HCC)    Patient was evaluated and treated. All patient's and/or  POA's questions/concerns addressed on today's visit. Toenails 1-5 debrided in length and girth without incident. Continue soft, supportive shoe gear daily. Report any pedal injuries to medical professional. Call office if there are any questions/concerns. -Patient under care of [facility] for right 2nd digit and right heel. -Patient/POA to call should there be question/concern in the interim.   Return in about 3 months (around 03/08/2024).  Freddie Breech, DPM      [place] LOCATION: [address], [place] [address]                   Office [phone]   [facility] [place] LOCATION: [address] [place], [place] [address] Office [phone]

## 2023-12-17 NOTE — Progress Notes (Addendum)
[name], [name] ([id number]) [id number] Page 1 of 9 Visit Report for 12/16/2023 Arrival Information Details Patient Name: Date of Service: Elijah Peers RD K. 12/16/2023 12:30 PM Medical Record Number: [id number] Patient Account Number: [id number] Date of Birth/Sex: Treating RN: 02/06/1941 (83 y.o. Elijah Oliver) Elijah Oliver Primary Care Parlee Amescua: Marisue Ivan Other Clinician: Referring Anuhea Gassner: Treating Kirston Luty/Extender: Rebecka Apley in Treatment: 7 Visit Information History Since Last Visit Added or deleted any medications: No Patient Arrived: Ambulatory Any new allergies or adverse reactions: No Arrival Time: 12:44 Had a fall or experienced change in No Accompanied By: self activities of daily living that may affect Transfer Assistance: None risk of falls: Patient Identification Verified: Yes Signs or symptoms of abuse/neglect since last visito No Secondary Verification Process Completed: Yes Hospitalized since last visit: No Patient Requires Transmission-Based Precautions: No Implantable device outside of the clinic excluding No Patient Has Alerts: Yes cellular tissue based products placed in the center Patient Alerts: Patient on Blood Thinner since last visit: type 2 diabetic Has Dressing in Place as Prescribed: Yes Pain Present Now: No Electronic Signature(s) Signed: 12/17/2023 8:18:57 AM By: Elijah Pax RN Entered By: Elijah Oliver on 12/16/2023 12:48:40 -------------------------------------------------------------------------------- Clinic Level of Care Assessment Details Patient Name: Date of Service: Elijah Peers RD K. 12/16/2023 12:30 PM Medical Record Number: [id number] Patient Account Number: [id number] Date of Birth/Sex: Treating RN: December 19, 1940 (83 y.o. Elijah Oliver) Elijah Oliver Primary Care Trenise Turay: Marisue Ivan Other Clinician: Referring Aniket Paye: Treating Pastor Sgro/Extender: Maxwell Caul Weeks  in Treatment: 7 Clinic Level of Care Assessment Items TOOL 4 Quantity Score X- 1 0 Use when only an EandM is performed on FOLLOW-UP visit ASSESSMENTS - Nursing Assessment / Reassessment X- 1 10 Reassessment of Co-morbidities (includes updates in patient status) X- 1 5 Reassessment of Adherence to Treatment Plan Elijah, Oliver ([id number]) [phone] Page 2 of 9 ASSESSMENTS - Wound and Skin A ssessment / Reassessment []  - Simple Wound Assessment / Reassessment - one wound 0 []  - 0 Complex Wound Assessment / Reassessment - multiple wounds []  - 0 Dermatologic / Skin Assessment (not related to wound area) ASSESSMENTS - Focused Assessment []  - 0 Circumferential Edema Measurements - multi extremities []  - 0 Nutritional Assessment / Counseling / Intervention []  - 0 Lower Extremity Assessment (monofilament, tuning fork, pulses) []  - 0 Peripheral Arterial Disease Assessment (using hand held doppler) ASSESSMENTS - Ostomy and/or Continence Assessment and Care []  - 0 Incontinence Assessment and Management []  - 0 Ostomy Care Assessment and Management (repouching, etc.) PROCESS - Coordination of Care X - Simple Patient / Family Education for ongoing care 1 15 []  - 0 Complex (extensive) Patient / Family Education for ongoing care []  - 0 Staff obtains Chiropractor, Records, T Results / Process Orders est []  - 0 Staff telephones HHA, Nursing Homes / Clarify orders / etc []  - 0 Routine Transfer to another Facility (non-emergent condition) []  - 0 Routine Hospital Admission (non-emergent condition) []  - 0 New Admissions / Manufacturing engineer / Ordering NPWT Apligraf, etc. , []  - 0 Emergency Hospital Admission (emergent condition) X- 1 10 Simple Discharge Coordination []  - 0 Complex (extensive) Discharge Coordination PROCESS - Special Needs []  - 0 Pediatric / Minor Patient Management []  - 0 Isolation Patient Management []  - 0 Hearing / Language /  Visual special needs []  - 0 Assessment of Community assistance (transportation, D/C planning, etc.) []  - 0 Additional assistance / Altered mentation []  - 0 Support Surface(s) Assessment (bed, cushion, seat, etc.)  INTERVENTIONS - Wound Cleansing / Measurement []  - 0 Simple Wound Cleansing - one wound []  - 0 Complex Wound Cleansing - multiple wounds []  - 0 Wound Imaging (photographs - any number of wounds) []  - 0 Wound Tracing (instead of photographs) []  - 0 Simple Wound Measurement - one wound []  - 0 Complex Wound Measurement - multiple wounds INTERVENTIONS - Wound Dressings []  - 0 Small Wound Dressing one or multiple wounds []  - 0 Medium Wound Dressing one or multiple wounds []  - 0 Large Wound Dressing one or multiple wounds []  - 0 Application of Medications - topical []  - 0 Application of Medications - injection INTERVENTIONS - Miscellaneous []  - 0 External ear exam Elijah, Oliver ([id number]) [phone].pdf Page 3 of 9 []  - 0 Specimen Collection (cultures, biopsies, blood, body fluids, etc.) []  - 0 Specimen(s) / Culture(s) sent or taken to Lab for analysis []  - 0 Patient Transfer (multiple staff / Michiel Sites Lift / Similar devices) []  - 0 Simple Staple / Suture removal (25 or less) []  - 0 Complex Staple / Suture removal (26 or more) []  - 0 Hypo / Hyperglycemic Management (close monitor of Blood Glucose) []  - 0 Ankle / Brachial Index (ABI) - do not check if billed separately X- 1 5 Vital Signs Has the patient been seen at the hospital within the last three years: Yes Total Score: 45 Level Of Care: New/Established - Level 2 Electronic Signature(s) Unsigned Entered ByYevonne Oliver on 12/17/2023 08:24:42 -------------------------------------------------------------------------------- Encounter Discharge Information Details Patient Name: Date of Service: Elijah Peers RD K. 12/16/2023 12:30 PM Medical Record Number: [id number] Patient  Account Number: [id number] Date of Birth/Sex: Treating RN: 1941-04-03 (83 y.o. Elijah Oliver Primary Care Zulema Pulaski: Marisue Ivan Other Clinician: Referring Gidget Quizhpi: Treating Jaelen Soth/Extender: Rebecka Apley in Treatment: 7 Encounter Discharge Information Items Discharge Condition: Stable Ambulatory Status: Ambulatory Discharge Destination: Home Transportation: Private Auto Accompanied By: self Schedule Follow-up Appointment: Yes Clinical Summary of Care: Electronic Signature(s) Signed: 12/17/2023 8:26:06 AM By: Elijah Pax RN Entered By: Elijah Oliver on 12/17/2023 08:26:05 Lower Extremity Assessment Details -------------------------------------------------------------------------------- Durene Cal ([id number]) [id number] Page 4 of 9 Patient Name: Date of Service: Elijah Peers RD K. 12/16/2023 12:30 PM Medical Record Number: [id number] Patient Account Number: [id number] Date of Birth/Sex: Treating RN: Jul 29, 1941 (83 y.o. Elijah Oliver) Elijah Oliver Primary Care Reola Buckles: Marisue Ivan Other Clinician: Referring Dakwon Wenberg: Treating Melisse Caetano/Extender: Maxwell Caul Weeks in Treatment: 7 Electronic Signature(s) Signed: 12/17/2023 8:18:57 AM By: Elijah Pax RN Entered By: Elijah Oliver on 12/16/2023 13:14:29 -------------------------------------------------------------------------------- Multi Wound Chart Details Patient Name: Date of Service: Elijah Peers RD K. 12/16/2023 12:30 PM Medical Record Number: [id number] Patient Account Number: [id number] Date of Birth/Sex: Treating RN: 1941-09-08 (83 y.o. Elijah Oliver) Elijah Oliver Primary Care Julyssa Kyer: Marisue Ivan Other Clinician: Referring Kipper Buch: Treating Quantae Martel/Extender: Maxwell Caul Weeks in Treatment: 7 Vital Signs Height(in): 72 Pulse(bpm): 62 Weight(lbs): 214 Blood Pressure(mmHg): 136/78 Body Mass Index(BMI): 29 Temperature(F):  97.8 Respiratory Rate(breaths/min): 18 [1:Photos:] [N/A:N/A] Right, Medial Calcaneus Right, Anterior T Second oe N/A Wound Location: Blister Trauma N/A Wounding Event: Diabetic Wound/Ulcer of the Lower Trauma, Other N/A Primary Etiology: Extremity Glaucoma, Sleep Apnea, Coronary Glaucoma, Sleep Apnea, Coronary N/A Comorbid History: Artery Disease, Hypertension, Type II Artery Disease, Hypertension, Type II Diabetes, End Stage Renal Disease, Diabetes, End Stage Renal Disease, Neuropathy, Seizure Disorder Neuropathy, Seizure Disorder 10/08/2023 11/26/2023 N/A Date Acquired: 7 2 N/A Weeks of Treatment: Healed - Epithelialized Healed - Epithelialized  N/A Wound Status: No No N/A Wound Recurrence: 0x0x0 0x0x0 N/A Measurements L x W x D (cm) 0 0 N/A A (cm) : rea 0 0 N/A Volume (cm) : 100.00% 100.00% N/A % Reduction in Area: 100.00% 100.00% N/A % Reduction in Volume: Grade 2 Full Thickness Without Exposed N/A Classification: Support Structures None Present None Present N/A Exudate Amount: None Present (0%) None Present (0%) N/A Granulation Amount: None Present (0%) None Present (0%) N/A Necrotic Amount: Fascia: No Fascia: No N/A Exposed Structures: Fat Layer (Subcutaneous Tissue): No Fat Layer (Subcutaneous Tissue): No Tendon: No Tendon: No JOVON, LETTER ([id number]) [phone] Page 5 of 9 Muscle: No Muscle: No Joint: No Joint: No Bone: No Bone: No Large (67-100%) Large (67-100%) N/A Epithelialization: Treatment Notes Electronic Signature(s) Signed: 12/17/2023 8:18:57 AM By: Elijah Pax RN Entered By: Elijah Oliver on 12/16/2023 13:15:02 -------------------------------------------------------------------------------- Multi-Disciplinary Care Plan Details Patient Name: Date of Service: Elijah Peers RD K. 12/16/2023 12:30 PM Medical Record Number: [id number] Patient Account Number: [id number] Date of Birth/Sex: Treating  RN: [birth year] (82 y.o. Elijah Oliver Primary Care Nayelli Inglis: Marisue Ivan Other Clinician: Referring Areyana Leoni: Treating Corynne Scibilia/Extender: Maxwell Caul Weeks in Treatment: 7 Active Inactive Electronic Signature(s) Signed: 12/17/2023 8:25:04 AM By: Elijah Pax RN Entered By: Elijah Oliver on 12/17/2023 08:25:04 -------------------------------------------------------------------------------- Pain Assessment Details Patient Name: Date of Service: Elijah Peers RD K. 12/16/2023 12:30 PM Medical Record Number: [id number] Patient Account Number: [id number] Date of Birth/Sex: Treating RN: 1941/06/20 (82 y.o. Elijah Oliver Primary Care Angi Goodell: Marisue Ivan Other Clinician: Referring Ashanti Littles: Treating Rik Wadel/Extender: Maxwell Caul Weeks in Treatment: 7 Active Problems Location of Pain Severity and Description of Pain Patient Has Paino No Site Locations [place], McLeod K ([id number]) [id number] Page 6 of 9 Pain Management and Medication Current Pain Management: Electronic Signature(s) Signed: 12/17/2023 8:18:57 AM By: Elijah Pax RN Entered By: Elijah Oliver on 12/16/2023 12:49:15 -------------------------------------------------------------------------------- Patient/Caregiver Education Details Patient Name: Date of Service: Elijah Peers RD K. 1/16/2025andnbsp12:30 PM Medical Record Number: [id number] Patient Account Number: [id number] Date of Birth/Gender: Treating RN: 1941/09/16 (82 y.o. Elijah Oliver Primary Care Physician: Marisue Ivan Other Clinician: Referring Physician: Treating Physician/Extender: Rebecka Apley in Treatment: 7 Education Assessment Education Provided To: Patient Education Topics Provided Wound/Skin Impairment: Handouts: Other: discharge instructions Methods: Explain/Verbal Responses: State content correctly Electronic  Signature(s) Unsigned Entered ByYevonne Oliver on 12/17/2023 08:25:21 Signature(s): LOUKAS, CRUZEN ([id number]) [phone] Date(s): [id number] Page 7 of 9 -------------------------------------------------------------------------------- Wound Assessment Details Patient Name: Date of Service: Elijah Peers RD K. 12/16/2023 12:30 PM Medical Record Number: [id number] Patient Account Number: [id number] Date of Birth/Sex: Treating RN: 25-Jul-1941 (83 y.o. Elijah Oliver) Elijah Oliver Primary Care Zailyn Thoennes: Marisue Ivan Other Clinician: Referring Cranston Koors: Treating Jeanice Dempsey/Extender: Maxwell Caul Weeks in Treatment: 7 Wound Status Wound Number: 1 Primary Diabetic Wound/Ulcer of the Lower Extremity Etiology: Wound Location: Right, Medial Calcaneus Wound Healed - Epithelialized Wounding Event: Blister Status: Date Acquired: 10/08/2023 Comorbid Glaucoma, Sleep Apnea, Coronary Artery Disease, Hypertension, Weeks Of Treatment: 7 History: Type II Diabetes, End Stage Renal Disease, Neuropathy, Seizure Clustered Wound: No Disorder Photos Wound Measurements Length: (cm) Width: (cm) Depth: (cm) Area: (cm) Volume: (cm) 0 % Reduction in Area: 100% 0 % Reduction in Volume: 100% 0 Epithelialization: Large (67-100%) 0 Tunneling: No 0 Undermining: No Wound Description Classification: Grade 2 Exudate Amount: None Present Foul Odor After Cleansing: No Slough/Fibrino No Wound Bed Granulation Amount: None Present (0%) Exposed Structure Necrotic  Amount: None Present (0%) Fascia Exposed: No Fat Layer (Subcutaneous Tissue) Exposed: No Tendon Exposed: No Muscle Exposed: No Joint Exposed: No Bone Exposed: No Treatment Notes Wound #1 (Calcaneus) Wound Laterality: Right, Medial Cleanser Peri-Wound Care Topical Primary Dressing KAIRON, PELOSO ([id number]) [id number] Page 8 of 9 Secondary Dressing Secured With Compression  Wrap Compression Stockings Add-Ons Electronic Signature(s) Signed: 12/17/2023 8:18:57 AM By: Elijah Pax RN Entered By: Elijah Oliver on 12/16/2023 13:13:56 -------------------------------------------------------------------------------- Wound Assessment Details Patient Name: Date of Service: Elijah Peers RD K. 12/16/2023 12:30 PM Medical Record Number: [id number] Patient Account Number: [id number] Date of Birth/Sex: Treating RN: [birth year] (83 y.o. Elijah Oliver) Elijah Oliver Primary Care Teresa Lemmerman: Marisue Ivan Other Clinician: Referring Tilly Pernice: Treating Marynell Bies/Extender: Maxwell Caul Weeks in Treatment: 7 Wound Status Wound Number: 3 Primary Trauma, Other Etiology: Wound Location: Right, Anterior T Second oe Wound Healed - Epithelialized Wounding Event: Trauma Status: Date Acquired: 11/26/2023 Comorbid Glaucoma, Sleep Apnea, Coronary Artery Disease, Hypertension, Weeks Of Treatment: 2 History: Type II Diabetes, End Stage Renal Disease, Neuropathy, Seizure Clustered Wound: No Disorder Photos Wound Measurements Length: (cm) Width: (cm) Depth: (cm) Area: (cm) Volume: (cm) 0 % Reduction in Area: 100% 0 % Reduction in Volume: 100% 0 Epithelialization: Large (67-100%) 0 Tunneling: No 0 Undermining: No Wound Description Classification: Full Thickness Without Exposed Support Exudate Amount: None Present Structures Foul Odor After Cleansing: No Slough/Fibrino No Wound Bed Granulation Amount: None Present (0%) Exposed Structure Necrotic Amount: None Present (0%) Fascia Exposed: No Fat Layer (Subcutaneous Tissue) Exposed: No SAMEUL, SCHRICK ([id number]) [id number].pdf Page 9 of 9 Tendon Exposed: No Muscle Exposed: No Joint Exposed: No Bone Exposed: No Treatment Notes Wound #3 (Toe Second) Wound Laterality: Right, Anterior Cleanser Peri-Wound Care Topical Primary Dressing Secondary Dressing Secured With Compression  Wrap Compression Stockings Add-Ons Electronic Signature(s) Signed: 12/17/2023 8:18:57 AM By: Elijah Pax RN Entered By: Elijah Oliver on 12/16/2023 13:14:18 -------------------------------------------------------------------------------- Vitals Details Patient Name: Date of Service: Elijah Peers RD K. 12/16/2023 12:30 PM Medical Record Number: [id number] Patient Account Number: [id number] Date of Birth/Sex: Treating RN: 1941-10-16 (82 y.o. Elijah Oliver Primary Care Cayle Cordoba: Marisue Ivan Other Clinician: Referring Kendrix Orman: Treating Maryse Brierley/Extender: Maxwell Caul Weeks in Treatment: 7 Vital Signs Time Taken: 12:48 Temperature (F): 97.8 Height (in): 72 Pulse (bpm): 62 Weight (lbs): 214 Respiratory Rate (breaths/min): 18 Body Mass Index (BMI): 29 Blood Pressure (mmHg): 136/78 Reference Range: 80 - 120 mg / dl Electronic Signature(s) Signed: 12/17/2023 8:18:57 AM By: Elijah Pax RN Entered By: Elijah Oliver on 12/16/2023 12:49:05

## 2024-03-10 ENCOUNTER — Ambulatory Visit: Payer: Medicare Other | Admitting: Podiatry

## 2024-03-10 ENCOUNTER — Encounter: Payer: Self-pay | Admitting: Podiatry

## 2024-03-10 VITALS — Ht 71.5 in | Wt 214.0 lb

## 2024-03-10 DIAGNOSIS — M79675 Pain in left toe(s): Secondary | ICD-10-CM

## 2024-03-10 DIAGNOSIS — E1142 Type 2 diabetes mellitus with diabetic polyneuropathy: Secondary | ICD-10-CM

## 2024-03-10 DIAGNOSIS — L84 Corns and callosities: Secondary | ICD-10-CM | POA: Diagnosis not present

## 2024-03-10 DIAGNOSIS — B351 Tinea unguium: Secondary | ICD-10-CM | POA: Diagnosis not present

## 2024-03-10 DIAGNOSIS — M79674 Pain in right toe(s): Secondary | ICD-10-CM | POA: Diagnosis not present

## 2024-03-16 NOTE — Progress Notes (Signed)
  Subjective:  Patient ID: Elijah Oliver, male    DOB: March 30, 1941,  MRN: [id number]  Elijah Oliver presents to clinic today for at risk foot care with history of diabetic neuropathy and painful mycotic toenails x 10 which interfere with daily activities. Pain is relieved with periodic professional debridement.  Chief Complaint  Patient presents with   Nail Problem    Pt is here for [facility] last A1C was 6.8 PCP is Dr Jerone Moorman and LOV was in December.   New problem(s): None.   PCP is Monique Ano, MD.  Allergies  Allergen Reactions   Other Anaphylaxis    Oriental food- anaphylaxis   Review of Systems: Negative except as noted in the HPI.  Objective: No changes noted in today's physical examination. There were no vitals filed for this visit. Elijah Oliver is a pleasant 83 y.o. male in NAD. AAO x 3.  Vascular Examination: Vascular status intact b/l with palpable pedal pulses. CFT <3 seconds.  Pedal hair diminished. No edema. No pain with calf compression b/l. Skin temperature gradient WNL b/l. No varicosities noted. No cyanosis or clubbing noted.  Neurological Examination: Protective sensation diminished with 10g monofilament b/l.  Dermatological Examination: Pedal skin with normal turgor, texture and tone b/l. Fissured hyperkeratotic lesion(s) noted posterior aspect of right heel. No erythema, no edema, no drainage, no fluctuance. Toenails 1-5 b/l thick, discolored, elongated with subungual debris and pain on dorsal palpation.   Musculoskeletal Examination: Muscle strength 5/5 to b/l LE.  No pain, crepitus noted b/l. Hammertoe deformity noted 1-5 b/l  Assessment/Plan: 1. Pain due to onychomycosis of toenails of both feet   2. Callus   3. Diabetic polyneuropathy associated with type 2 diabetes mellitus (HCC)   -Consent given for treatment as described below: -Examined patient. -Continue foot and shoe inspections daily. Monitor blood glucose per PCP/Endocrinologist's  recommendations. -Patient to continue soft, supportive shoe gear daily. -Mycotic toenails 1-5 bilaterally were debrided in length and girth with sterile nail nippers and dremel without incident. -Callus(es) right heel pared utilizing rotary bur without complication or incident. Total number pared =1. Recommended daily moisturizing with OTC moisturizing lotion followed by Vaseline. -Patient/POA to call should there be question/concern in the interim.   No follow-ups on file.  Luella Sager, DPM      [place] LOCATION: [address], [place] [address]                   Office [phone]   [facility] LOCATION: [address] [place], [place] [address] Office [phone]

## 2024-05-05 ENCOUNTER — Ambulatory Visit: Payer: Self-pay | Admitting: Urology

## 2024-05-12 ENCOUNTER — Ambulatory Visit: Admitting: Urology

## 2024-06-01 ENCOUNTER — Other Ambulatory Visit: Payer: Self-pay

## 2024-06-01 DIAGNOSIS — C61 Malignant neoplasm of prostate: Secondary | ICD-10-CM

## 2024-06-01 DIAGNOSIS — Z8551 Personal history of malignant neoplasm of bladder: Secondary | ICD-10-CM

## 2024-06-01 DIAGNOSIS — N3941 Urge incontinence: Secondary | ICD-10-CM

## 2024-06-03 NOTE — Progress Notes (Unsigned)
 06/05/2024 4:18 PM   Elijah Oliver 12-07-1940 [id number]  Referring provider: Alla Amis, MD [id number] [facility] [address] [facility] [place],  [place] [address]  Urological history: 1. Bladder cancer - TgTa TCC (01/2012) - on annual surveillance  2. Prostate cancer - PSA pending - RRP (2004)   3. 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 Urgency - Myrbetriq  50 gm daily cost prohibitive - oxybutynin  IR 5 mg TID   5. Nephrolithiasis - ESWL x 3 (2013, 2013, 2015) - CT (2018) no stones - RUS (2024) no stones   Chief Complaint  Patient presents with   Follow-up   HPI: Elijah Oliver is a 83 y.o. man who presents today for yearly routine visit.    Previous records reviewed.   He has a history of bladder cancer and was supposed to be scheduled with Dr. Penne for annual surveillance cystoscopy.  Non smoker.  No alcohol.   He continues to dribble.  He wears incontinent pads to manage this.  Patient denies any modifying or aggravating factors.  Patient denies any recent UTI's, gross hematuria, dysuria or suprapubic/flank pain.  Patient denies any fevers, chills, nausea or vomiting.    UA with moderate bacteria.  Serum creatinine (03/2024) 1.5, eGFR 46  Hbg A1c (03/2024) 6.9  PSA pending  PMH: Past Medical History:  Diagnosis Date   Backache 07/23/2017   Benign essential hypertension 07/23/2017   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/23/2017   Cellulitis of great toe of left foot 07/23/2017   CKD (chronic kidney disease) stage 3, GFR 30-59 ml/min (HCC) 07/06/2017   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   Overview:  Sequential left internal mammary to the mid LAD and distal LAD, SVG to the second obtuse marginal and SVG to the right coronary artery. June 24th, 2006 Dr. Debby Cough   History of prostate cancer 07/02/2017   Ingrowing nail with infection 07/23/2017   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 07/23/2017   Neck  sprain 07/23/2017   Neoplasm of uncertain behavior of skin 07/23/2017   Nephrolithiasis 07/23/2017   Pure hypercholesterolemia 07/02/2017   Seizure disorder (HCC) 07/02/2017   Shoulder strain 07/23/2017   Sleep apnea 07/23/2017   SOB (shortness of breath) 07/23/2017   Thoracic sprain 07/23/2017   Type 2 diabetes mellitus with diabetic neuropathy, without long-term current use of insulin (HCC) 07/02/2017   Urge incontinence of urine 07/23/2017    Surgical History: Past Surgical History:  Procedure Laterality Date   CARDIAC SURGERY  2006   PROSTATECTOMY  2004   REPLACEMENT TOTAL KNEE  2016   TRANSURETHRAL RESECTION OF BLADDER TUMOR WITH GYRUS (TURBT-GYRUS)  2010?   URETEROSCOPY WITH HOLMIUM LASER LITHOTRIPSY  2010?    Home Medications:  Allergies as of 06/05/2024       Reactions   Other Anaphylaxis   Oriental food- anaphylaxis        Medication List        Accurate as of June 05, 2024  4:18 PM. If you have any questions, ask your nurse or doctor.          aspirin 81 MG chewable tablet Chew by mouth.   atorvastatin 40 MG tablet Commonly known as: LIPITOR Take 1 tablet by mouth daily.   carvedilol 3.125 MG tablet Commonly known as: COREG TAKE 1 TABLET BY MOUTH  TWICE DAILY   Centrum Silver tablet Take by mouth.   Cholecalciferol 25 MCG (1000 UT) tablet Take by mouth.  glipiZIDE 2.5 MG 24 hr tablet Commonly known as: GLUCOTROL XL Take 2.5 mg by mouth daily.   levETIRAcetam 1000 MG tablet Commonly known as: KEPPRA Take 1 tablet by mouth 2 (two) times daily.   losartan 50 MG tablet Commonly known as: COZAAR Take 1 tablet by mouth daily.   MIRALAX PO Take by mouth. daily   omeprazole 20 MG capsule Commonly known as: PRILOSEC Take by mouth.   oxybutynin  5 MG tablet Commonly known as: DITROPAN  Take 1 tablet by mouth 3 (three) times daily.        Allergies:  Allergies  Allergen Reactions   Other Anaphylaxis    Oriental food- anaphylaxis    Family  History: Family History  Problem Relation Age of Onset   Tuberculosis Father    Prostate cancer Brother    Kidney cancer Neg Hx    Bladder Cancer Neg Hx     Social History: See HPI for pertinent social history  ROS: Pertinent ROS in HPI  Physical Exam: BP 139/75 (BP Location: Left Arm, Patient Position: Sitting, Cuff Size: Normal)   Pulse 67   Ht 5' 11 (1.803 m)   Wt 210 lb (95.3 kg)   SpO2 96%   BMI 29.29 kg/m   Constitutional:  Well nourished. Alert and oriented, No acute distress. HEENT: [place] AT, moist mucus membranes.  Trachea midline Cardiovascular: No clubbing, cyanosis, or edema. Respiratory: Normal respiratory effort, no increased work of breathing. Neurologic: Grossly intact, no focal deficits, moving all 4 extremities. Psychiatric: Normal mood and affect.  Laboratory Data: See EPIC and HPI  I have reviewed the labs.   Pertinent Imaging: N/A  Assessment & Plan:    1. Bladder cancer - needs yearly cysto scheduled  2. Prostate cancer - PSA pending  3. SUI - continue to manage with incontinence pads   4. UI - continue oxybutynin  IR 5 mg TID  Return for Cystoscopy for annual surveillance for bladder cancer.  These notes generated with voice recognition software. I apologize for typographical errors.  CLOTILDA HELON RIGGERS  [facility] [address]  [address] [place], [place] [address] [phone]

## 2024-06-05 ENCOUNTER — Ambulatory Visit: Admitting: Urology

## 2024-06-05 ENCOUNTER — Encounter: Payer: Self-pay | Admitting: Urology

## 2024-06-05 VITALS — BP 139/75 | HR 67 | Ht 71.0 in | Wt 210.0 lb

## 2024-06-05 DIAGNOSIS — C61 Malignant neoplasm of prostate: Secondary | ICD-10-CM | POA: Diagnosis not present

## 2024-06-05 DIAGNOSIS — N393 Stress incontinence (female) (male): Secondary | ICD-10-CM

## 2024-06-05 DIAGNOSIS — N3941 Urge incontinence: Secondary | ICD-10-CM

## 2024-06-05 DIAGNOSIS — C679 Malignant neoplasm of bladder, unspecified: Secondary | ICD-10-CM | POA: Diagnosis not present

## 2024-06-05 LAB — URINALYSIS, COMPLETE
Bilirubin, UA: NEGATIVE
Glucose, UA: NEGATIVE
Ketones, UA: NEGATIVE
Leukocytes,UA: NEGATIVE
Nitrite, UA: POSITIVE — AB
Specific Gravity, UA: 1.025 (ref 1.005–1.030)
Urobilinogen, Ur: 0.2 mg/dL (ref 0.2–1.0)
pH, UA: 6 (ref 5.0–7.5)

## 2024-06-05 LAB — MICROSCOPIC EXAMINATION

## 2024-06-05 NOTE — Patient Instructions (Signed)

## 2024-06-06 ENCOUNTER — Ambulatory Visit: Payer: Self-pay | Admitting: Urology

## 2024-06-06 LAB — PSA: Prostate Specific Ag, Serum: <0.1 ng/mL (ref 0.0–4.0)

## 2024-06-08 ENCOUNTER — Other Ambulatory Visit: Payer: Self-pay

## 2024-06-08 LAB — CULTURE, URINE COMPREHENSIVE

## 2024-06-08 MED ORDER — CEFUROXIME AXETIL 250 MG PO TABS: 250.0000 mg | ORAL_TABLET | Freq: Two times a day (BID) | ORAL | 0 refills | Status: AC

## 2024-06-09 ENCOUNTER — Other Ambulatory Visit (INDEPENDENT_AMBULATORY_CARE_PROVIDER_SITE_OTHER): Admitting: Podiatry

## 2024-06-09 ENCOUNTER — Other Ambulatory Visit

## 2024-06-09 DIAGNOSIS — M2041 Other hammer toe(s) (acquired), right foot: Secondary | ICD-10-CM

## 2024-06-09 DIAGNOSIS — E1142 Type 2 diabetes mellitus with diabetic polyneuropathy: Secondary | ICD-10-CM

## 2024-06-09 DIAGNOSIS — M2042 Other hammer toe(s) (acquired), left foot: Secondary | ICD-10-CM

## 2024-06-09 NOTE — Progress Notes (Signed)
 1. Hammer toes of both feet   2. Diabetic polyneuropathy associated with type 2 diabetes mellitus (HCC)    Orders Placed This Encounter  Procedures   For home use only DME Other see comment    To [facility] [address] [place], [place]  Dispense one pair extra depth shoes and 3 pair heat moldable insoles.    Length of Need:   Lifetime

## 2024-06-15 ENCOUNTER — Ambulatory Visit: Admitting: Podiatry

## 2024-06-15 ENCOUNTER — Encounter: Payer: Self-pay | Admitting: Podiatry

## 2024-06-15 DIAGNOSIS — M79674 Pain in right toe(s): Secondary | ICD-10-CM

## 2024-06-15 DIAGNOSIS — M2030 Hallux varus (acquired), unspecified foot: Secondary | ICD-10-CM | POA: Diagnosis not present

## 2024-06-15 DIAGNOSIS — M79675 Pain in left toe(s): Secondary | ICD-10-CM | POA: Diagnosis not present

## 2024-06-15 DIAGNOSIS — M2041 Other hammer toe(s) (acquired), right foot: Secondary | ICD-10-CM

## 2024-06-15 DIAGNOSIS — E119 Type 2 diabetes mellitus without complications: Secondary | ICD-10-CM

## 2024-06-15 DIAGNOSIS — E1142 Type 2 diabetes mellitus with diabetic polyneuropathy: Secondary | ICD-10-CM

## 2024-06-15 DIAGNOSIS — B351 Tinea unguium: Secondary | ICD-10-CM

## 2024-06-15 DIAGNOSIS — M2042 Other hammer toe(s) (acquired), left foot: Secondary | ICD-10-CM

## 2024-06-15 NOTE — Progress Notes (Signed)
 ANNUAL DIABETIC FOOT EXAM  Subjective: Elijah Oliver presents today for annual diabetic foot exam.  Chief Complaint  Patient presents with   Nail Problem    Thick painful toenails, 3 month follow up    Patient confirms h/o diabetes.  Patient denies any h/o foot wounds.  Patient has been diagnosed with neuropathy.  Elijah Amis, MD is patient's PCP. LOV 05/01/2024.  Past Medical History:  Diagnosis Date   Backache 07/23/2017   Benign essential hypertension 07/23/2017   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/23/2017   Cellulitis of great toe of left foot 07/23/2017   CKD (chronic kidney disease) stage 3, GFR 30-59 ml/min (HCC) 07/06/2017   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   Overview:  Sequential left internal mammary to the mid LAD and distal LAD, SVG to the second obtuse marginal and SVG to the right coronary artery. June 24th, 2006 Dr. Debby Cough   History of prostate cancer 07/02/2017   Ingrowing nail with infection 07/23/2017   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 07/23/2017   Neck sprain 07/23/2017   Neoplasm of uncertain behavior of skin 07/23/2017   Nephrolithiasis 07/23/2017   Pure hypercholesterolemia 07/02/2017   Seizure disorder (HCC) 07/02/2017   Shoulder strain 07/23/2017   Sleep apnea 07/23/2017   SOB (shortness of breath) 07/23/2017   Thoracic sprain 07/23/2017   Type 2 diabetes mellitus with diabetic neuropathy, without long-term current use of insulin (HCC) 07/02/2017   Urge incontinence of urine 07/23/2017   Patient Active Problem List   Diagnosis Date Noted   Systolic murmur 02/10/2021   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   Cellulitis 12/01/2018   Dysarthria 12/01/2018   HTN (hypertension) 12/01/2018   Other hyperlipidemia 12/01/2018   DNR (do not resuscitate) 03/07/2018   Urge incontinence of urine 07/23/2017   Thoracic sprain 07/23/2017   Stye 07/23/2017   SOB (shortness of breath) 07/23/2017   Sleep apnea 07/23/2017   Shoulder strain  07/23/2017   Nephrolithiasis 07/23/2017   Neoplasm of uncertain behavior of skin 07/23/2017   Neck sprain 07/23/2017   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 07/23/2017   Leg swelling 07/23/2017   Ingrowing nail with infection 07/23/2017   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   DM (diabetes mellitus) (HCC) 07/23/2017   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/23/2017   Benign essential hypertension 07/23/2017   Backache 07/23/2017   Cellulitis of great toe of left foot 07/23/2017   Seizures (HCC) 07/13/2017   CKD (chronic kidney disease) stage 3, GFR 30-59 ml/min (HCC) 07/06/2017   Type 2 diabetes mellitus with diabetic neuropathy, without long-term current use of insulin (HCC) 07/02/2017   Seizure disorder (HCC) 07/02/2017   Pure hypercholesterolemia 07/02/2017   Obesity (BMI 30.0-34.9) 07/02/2017   History of prostate cancer 07/02/2017   Coronary artery disease involving coronary bypass graft of native heart without angina pectoris 07/02/2017   Past Surgical History:  Procedure Laterality Date   CARDIAC SURGERY  2006   PROSTATECTOMY  2004   REPLACEMENT TOTAL KNEE  2016   TRANSURETHRAL RESECTION OF BLADDER TUMOR WITH GYRUS (TURBT-GYRUS)  2010?   URETEROSCOPY WITH HOLMIUM LASER LITHOTRIPSY  2010?   Current Outpatient Medications on File Prior to Visit  Medication Sig Dispense Refill   aspirin 81 MG chewable tablet Chew by mouth.     atorvastatin (LIPITOR) 40 MG tablet Take 1 tablet by mouth daily.     carvedilol (COREG) 3.125 MG tablet TAKE 1 TABLET BY MOUTH  TWICE DAILY     Cholecalciferol 25 MCG (1000 UT) tablet Take  by mouth.     glipiZIDE (GLUCOTROL XL) 2.5 MG 24 hr tablet Take 2.5 mg by mouth daily.     levETIRAcetam (KEPPRA) 1000 MG tablet Take 1 tablet by mouth 2 (two) times daily.     losartan (COZAAR) 50 MG tablet Take 1 tablet by mouth daily.     Multiple Vitamins-Minerals (CENTRUM SILVER) tablet Take by mouth.     omeprazole (PRILOSEC) 20 MG capsule Take by mouth.     oxybutynin  (DITROPAN ) 5 MG  tablet Take 1 tablet by mouth 3 (three) times daily.     Polyethylene Glycol 3350 (MIRALAX PO) Take by mouth. daily     No current facility-administered medications on file prior to visit.    Allergies  Allergen Reactions   Other Anaphylaxis    Oriental food- anaphylaxis   Social History   Occupational History   Not on file  Tobacco Use   Smoking status: Never   Smokeless tobacco: Never  Substance and Sexual Activity   Alcohol use: No   Drug use: No   Sexual activity: Not on file   Family History  Problem Relation Age of Onset   Tuberculosis Father    Prostate cancer Brother    Kidney cancer Neg Hx    Bladder Cancer Neg Hx    Immunization History  Administered Date(s) Administered   Fluad Quad(high Dose 65+) 09/08/2021   Influenza, High Dose Seasonal PF 09/16/2013, 10/24/2014, 09/05/2016   Influenza,inj,Quad PF,6+ Mos 09/21/2015   Influenza-Unspecified 10/02/2006, 10/01/2007, 08/30/2010, 09/19/2011, 09/21/2015, 11/05/2017   PFIZER Comirnaty(Gray Top)Covid-19 Tri-Sucrose Vaccine 12/28/2019, 01/19/2020, 09/09/2020, 05/08/2021   Pfizer Covid-19 Vaccine Bivalent Booster 4yrs & up 12/08/2021   Pfizer(Comirnaty)Fall Seasonal Vaccine 12 years and older 10/23/2022   Pneumococcal Conjugate-13 08/29/2019   Pneumococcal Polysaccharide-23 08/27/2014   Tdap 11/17/2012   Zoster Recombinant(Shingrix) 04/25/2020, 11/21/2020   Zoster, Live 11/17/2012     Review of Systems: Negative except as noted in the HPI.   Objective: There were no vitals filed for this visit.  Elijah Oliver is a pleasant 83 y.o. male in NAD. AAO X 3.  Diabetic foot exam was performed with the following findings:   Vascular Examination: Capillary refill time immediate b/l. Palpable pedal pulses. Pedal hair absent b/l. No pain with calf compression b/l. Skin temperature gradient WNL b/l. No cyanosis or clubbing b/l. No ischemia or gangrene noted b/l. No edema noted b/l LE.  Neurological  Examination: Protective sensation diminished with 10g monofilament b/l.  Dermatological Examination: Pedal skin with normal turgor, texture and tone b/l.  No open wounds. No interdigital macerations.   Toenails 1-5 b/l thick, discolored, elongated with subungual debris and pain on dorsal palpation.   No corns, calluses nor porokeratotic lesions noted.  Musculoskeletal Examination: Muscle strength 5/5 to all lower extremity muscle groups bilaterally. Hallux hammertoe noted b/l LE. Hammertoe(s) 2-5 b/l.  Radiographs: None   ADA Risk Categorization: High Risk  Patient has one or more of the following: Loss of protective sensation Absent pedal pulses Severe Foot deformity History of foot ulcer  Assessment: 1. Pain due to onychomycosis of toenails of both feet   2. Hallux malleus, unspecified laterality   3. Hammer toes of both feet   4. Diabetic polyneuropathy associated with type 2 diabetes mellitus (HCC)   5. Encounter for diabetic foot exam (HCC)     Plan: Diabetic foot examination performed today. All patient's and/or POA's questions/concerns addressed on today's visit. Toenails 1-5 debrided in length and girth without incident. Continue foot and  shoe inspections daily. Monitor blood glucose per PCP/Endocrinologist's recommendations. Continue soft, supportive shoe gear daily. Report any pedal injuries to medical professional. Call office if there are any questions/concerns. -Patient/POA to call should there be question/concern in the interim. Return in about 3 months (around 09/15/2024).  Elijah Oliver, DPM      [facility] LOCATION: [address] [place] [address]                   Office [phone]   [facility] LOCATION: [address] [place], [place] [address] Office [phone]

## 2024-06-21 ENCOUNTER — Encounter: Payer: Self-pay | Admitting: Ophthalmology

## 2024-06-21 NOTE — Anesthesia Preprocedure Evaluation (Addendum)
 Anesthesia Evaluation  Patient identified by MRN, date of birth, ID band Patient awake    Reviewed: Allergy & Precautions, H&P , NPO status , Patient's Chart, lab work & pertinent test results  Airway Mallampati: II  TM Distance: >3 FB Neck ROM: Full    Dental no notable dental hx.    Pulmonary neg pulmonary ROS, sleep apnea    Pulmonary exam normal breath sounds clear to auscultation       Cardiovascular hypertension, + CAD  Normal cardiovascular exam+ Valvular Problems/Murmurs  Rhythm:Regular Rate:Normal     Neuro/Psych Seizures -,  negative neurological ROS  negative psych ROS   GI/Hepatic negative GI ROS, Neg liver ROS,GERD  ,,  Endo/Other  diabetes    Renal/GU Renal diseasenegative Renal ROS  negative genitourinary   Musculoskeletal negative musculoskeletal ROS (+) Arthritis ,    Abdominal   Peds negative pediatric ROS (+)  Hematology negative hematology ROS (+)   Anesthesia Other Findings Office note 02-10-24 He reports ongoing balance issues but has not experienced any recent falls. He is very careful to avoid falls and uses a cane occasionally.  He maintains an exercise routine at [employer] six days a week, using the treadmill and recumbent bike for 15 minutes each, and engaging in leg exercises. Despite not enjoying exercise, he continues for health benefits. No muscle weakness or difficulty with exercise is noted.  Seizures well controlled on Keppra   Coronary arteriosclerosis  Benign essential hypertension Sleep apnea Type 2 diabetes mellitus with diabetic neuropathy, without long-term current use of insulin (HCC) Seizure disorder   Thoracic sprain Shoulder strain  Neoplasm of uncertain behavior of skin Neck sprain  Ingrowing nail with infection Nephrolithiasis  Bladder cancer (HCC) CKD (chronic kidney disease) stage 3, GFR 30-59 ml/min (HCC)  Urge incontinence of urine SOB (shortness of  breath)  Pure hypercholesterolemia 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  History of prostate cancer Cellulitis of great toe of left foot Backache Arthritis  GERD (gastroesophageal reflux disease) Stage 3a chronic kidney disease       Reproductive/Obstetrics negative OB ROS                              Anesthesia Physical Anesthesia Plan  ASA: 3  Anesthesia Plan: MAC   Post-op Pain Management:    Induction: Intravenous  PONV Risk Score and Plan:   Airway Management Planned: Natural Airway and Nasal Cannula  Additional Equipment:   Intra-op Plan:   Post-operative Plan:   Informed Consent: I have reviewed the patients History and Physical, chart, labs and discussed the procedure including the risks, benefits and alternatives for the proposed anesthesia with the patient or authorized representative who has indicated his/her understanding and acceptance.     Dental Advisory Given  Plan Discussed with: Anesthesiologist, CRNA and Surgeon  Anesthesia Plan Comments: (Patient consented for risks of anesthesia including but not limited to:  - adverse reactions to medications - damage to eyes, teeth, lips or other oral mucosa - nerve damage due to positioning  - sore throat or hoarseness - Damage to heart, brain, nerves, lungs, other parts of body or loss of life  Patient voiced understanding and assent.)         Anesthesia Quick Evaluation

## 2024-06-27 NOTE — Discharge Instructions (Signed)

## 2024-06-29 ENCOUNTER — Ambulatory Visit: Payer: Self-pay | Admitting: Anesthesiology

## 2024-06-29 ENCOUNTER — Encounter: Payer: Self-pay | Admitting: Ophthalmology

## 2024-06-29 ENCOUNTER — Encounter
Admission: RE | Disposition: A | Discharge status: Home / Self Care | Payer: Self-pay | Source: Home / Self Care | Attending: Ophthalmology

## 2024-06-29 ENCOUNTER — Ambulatory Visit
Admission: RE | Admit: 2024-06-29 | Discharge: 2024-06-29 | Disposition: A | Discharge status: Home / Self Care | Attending: Ophthalmology | Admitting: Ophthalmology

## 2024-06-29 ENCOUNTER — Other Ambulatory Visit: Payer: Self-pay

## 2024-06-29 DIAGNOSIS — I2510 Atherosclerotic heart disease of native coronary artery without angina pectoris: Secondary | ICD-10-CM | POA: Diagnosis not present

## 2024-06-29 DIAGNOSIS — N1831 Chronic kidney disease, stage 3a: Secondary | ICD-10-CM | POA: Diagnosis not present

## 2024-06-29 DIAGNOSIS — K219 Gastro-esophageal reflux disease without esophagitis: Secondary | ICD-10-CM | POA: Insufficient documentation

## 2024-06-29 DIAGNOSIS — G4730 Sleep apnea, unspecified: Secondary | ICD-10-CM | POA: Insufficient documentation

## 2024-06-29 DIAGNOSIS — Z7984 Long term (current) use of oral hypoglycemic drugs: Secondary | ICD-10-CM | POA: Insufficient documentation

## 2024-06-29 DIAGNOSIS — M1990 Unspecified osteoarthritis, unspecified site: Secondary | ICD-10-CM | POA: Insufficient documentation

## 2024-06-29 DIAGNOSIS — E1122 Type 2 diabetes mellitus with diabetic chronic kidney disease: Secondary | ICD-10-CM | POA: Insufficient documentation

## 2024-06-29 DIAGNOSIS — E1136 Type 2 diabetes mellitus with diabetic cataract: Secondary | ICD-10-CM | POA: Insufficient documentation

## 2024-06-29 DIAGNOSIS — I129 Hypertensive chronic kidney disease with stage 1 through stage 4 chronic kidney disease, or unspecified chronic kidney disease: Secondary | ICD-10-CM | POA: Diagnosis not present

## 2024-06-29 DIAGNOSIS — H2512 Age-related nuclear cataract, left eye: Secondary | ICD-10-CM | POA: Insufficient documentation

## 2024-06-29 HISTORY — PX: CATARACT EXTRACTION W/PHACO: SHX586

## 2024-06-29 HISTORY — DX: Chronic kidney disease, stage 3a: N18.31

## 2024-06-29 HISTORY — DX: Gastro-esophageal reflux disease without esophagitis: K21.9

## 2024-06-29 HISTORY — DX: Unspecified osteoarthritis, unspecified site: M19.90

## 2024-06-29 LAB — GLUCOSE, CAPILLARY: Glucose-Capillary: 132 mg/dL — ABNORMAL HIGH (ref 70–99)

## 2024-06-29 SURGERY — PHACOEMULSIFICATION, CATARACT, WITH IOL INSERTION
Anesthesia: Monitor Anesthesia Care | Site: Eye | Laterality: Left

## 2024-06-29 MED ORDER — MOXIFLOXACIN HCL 0.5 % OP SOLN
OPHTHALMIC | Status: DC | PRN
Start: 2024-06-29 — End: 2024-06-29
  Administered 2024-06-29: .2 mL via OPHTHALMIC

## 2024-06-29 MED ORDER — TETRACAINE HCL 0.5 % OP SOLN
1.0000 [drp] | OPHTHALMIC | Status: DC | PRN
  Administered 2024-06-29 (×3): 1 [drp] via OPHTHALMIC

## 2024-06-29 MED ORDER — SIGHTPATH DOSE#1 BSS IO SOLN
INTRAOCULAR | Status: DC | PRN
  Administered 2024-06-29: 98 mL via OPHTHALMIC

## 2024-06-29 MED ORDER — MIDAZOLAM HCL 2 MG/2ML IJ SOLN
INTRAMUSCULAR | Status: AC
Start: 2024-06-29 — End: 2024-06-29
  Filled 2024-06-29: qty 2

## 2024-06-29 MED ORDER — TETRACAINE HCL 0.5 % OP SOLN
OPHTHALMIC | Status: AC
  Filled 2024-06-29: qty 4

## 2024-06-29 MED ORDER — FENTANYL CITRATE (PF) 100 MCG/2ML IJ SOLN
INTRAMUSCULAR | Status: AC
  Filled 2024-06-29: qty 2

## 2024-06-29 MED ORDER — LIDOCAINE HCL (PF) 2 % IJ SOLN
INTRAOCULAR | Status: DC | PRN
  Administered 2024-06-29: 4 mL via INTRAOCULAR

## 2024-06-29 MED ORDER — BRIMONIDINE TARTRATE-TIMOLOL 0.2-0.5 % OP SOLN
OPHTHALMIC | Status: DC | PRN
  Administered 2024-06-29: 1 [drp] via OPHTHALMIC

## 2024-06-29 MED ORDER — ARMC OPHTHALMIC DILATING DROPS
1.0000 | OPHTHALMIC | Status: DC | PRN
  Administered 2024-06-29 (×3): 1 via OPHTHALMIC

## 2024-06-29 MED ORDER — ARMC OPHTHALMIC DILATING DROPS
OPHTHALMIC | Status: AC
  Filled 2024-06-29: qty 0.5

## 2024-06-29 MED ORDER — MIDAZOLAM HCL 2 MG/2ML IJ SOLN
INTRAMUSCULAR | Status: DC | PRN
  Administered 2024-06-29 (×2): 1 mg via INTRAVENOUS

## 2024-06-29 MED ORDER — SIGHTPATH DOSE#1 NA HYALUR & NA CHOND-NA HYALUR IO KIT
PACK | INTRAOCULAR | Status: DC | PRN
  Administered 2024-06-29: 1 via OPHTHALMIC

## 2024-06-29 MED ORDER — SIGHTPATH DOSE#1 BSS IO SOLN
INTRAOCULAR | Status: DC | PRN
Start: 2024-06-29 — End: 2024-06-29
  Administered 2024-06-29: 15 mL via INTRAOCULAR

## 2024-06-29 MED ORDER — FENTANYL CITRATE (PF) 100 MCG/2ML IJ SOLN
INTRAMUSCULAR | Status: DC | PRN
  Administered 2024-06-29 (×2): 50 ug via INTRAVENOUS

## 2024-06-29 SURGICAL SUPPLY — 11 items
CATARACT SUITE SIGHTPATH (MISCELLANEOUS) ×1 IMPLANT
DISSECTOR HYDRO NUCLEUS 50X22 (MISCELLANEOUS) ×1 IMPLANT
DRSG TEGADERM 2-3/8X2-3/4 SM (GAUZE/BANDAGES/DRESSINGS) ×1 IMPLANT
FEE CATARACT SUITE SIGHTPATH (MISCELLANEOUS) ×1 IMPLANT
GLOVE BIOGEL PI IND STRL 8 (GLOVE) ×1 IMPLANT
GLOVE SURG LX STRL 7.5 STRW (GLOVE) ×1 IMPLANT
GLOVE SURG SYN 6.5 PF PI BL (GLOVE) ×1 IMPLANT
LENS IOL ENVISTA UV+ 22.0 (Intraocular Lens) IMPLANT
NDL FILTER BLUNT 18X1 1/2 (NEEDLE) ×1 IMPLANT
NEEDLE FILTER BLUNT 18X1 1/2 (NEEDLE) ×1 IMPLANT
SYR 3ML LL SCALE MARK (SYRINGE) ×1 IMPLANT

## 2024-06-29 NOTE — Anesthesia Postprocedure Evaluation (Signed)
 Anesthesia Post Note  Patient: Elijah Oliver  Procedure(s) Performed: PHACOEMULSIFICATION, CATARACT, WITH IOL INSERTION 5.09 00:44.1 (Left: Eye)  Patient location during evaluation: PACU Anesthesia Type: MAC Level of consciousness: awake and alert Pain management: pain level controlled Vital Signs Assessment: post-procedure vital signs reviewed and stable Respiratory status: spontaneous breathing, nonlabored ventilation, respiratory function stable and patient connected to nasal cannula oxygen Cardiovascular status: stable and blood pressure returned to baseline Postop Assessment: no apparent nausea or vomiting Anesthetic complications: no   No notable events documented.   Last Vitals:  Vitals:   06/29/24 1015 06/29/24 1019  BP: 131/75 (!) 141/68  Pulse: (!) 52 (!) 56  Resp: (!) 9 11  Temp:  (!) 36.3 C  SpO2: 93% 94%    Last Pain:  Vitals:   06/29/24 1019  TempSrc:   PainSc: 0-No pain                 Echo [place] C Aveen Stansel

## 2024-06-29 NOTE — Op Note (Signed)
 OPERATIVE NOTE  Elijah Oliver [id number] 06/29/2024   PREOPERATIVE DIAGNOSIS: Nuclear sclerotic cataract left eye. H25.12   POSTOPERATIVE DIAGNOSIS: Nuclear sclerotic cataract left eye. H25.12   PROCEDURE:  Phacoemusification with posterior chamber intraocular lens placement of the left eye  Ultrasound time: Procedure(s): PHACOEMULSIFICATION, CATARACT, WITH IOL INSERTION 5.09 00:44.1 (Left)  LENS:   Implant Name Type Inv. Item Serial No. Manufacturer Lot No. LRB No. Used Action  LENS IOL ENVISTA UV+ 22.0 - [id number] Intraocular Lens LENS IOL ENVISTA UV+ 22.0 [id number] [facility] [id number] Left 1 Implanted      SURGEON:  Feliciano HERO. Enola, MD   ANESTHESIA:  Topical with tetracaine  drops, augmented with 1% preservative-free intracameral lidocaine .   COMPLICATIONS:  None.   DESCRIPTION OF PROCEDURE:  The patient was identified in the holding room and transported to the operating room and placed in the supine position under the operating microscope.  The left eye was identified as the operative eye, which was prepped and draped in the usual sterile ophthalmic fashion.   A 1 millimeter clear-corneal paracentesis was made inferotemporally. Preservative-free 1% lidocaine  mixed with 1:1,000 bisulfite-free aqueous solution of epinephrine  was injected into the anterior chamber. The anterior chamber was then filled with Viscoat viscoelastic. A 2.4 millimeter keratome was used to make a clear-corneal incision superotemporally. A curvilinear capsulorrhexis was made with a cystotome and capsulorrhexis forceps. Balanced salt solution was used to hydrodissect and hydrodelineate the nucleus. Phacoemulsification was then used to remove the lens nucleus and epinucleus. The remaining cortex was then removed using the irrigation and aspiration handpiece. Provisc was then placed into the capsular bag to distend it for lens placement. A +22.00 D EE intraocular lens was then injected into the capsular bag. The  remaining viscoelastic was aspirated.   Wounds were hydrated with balanced salt solution.  The anterior chamber was inflated to a physiologic pressure with balanced salt solution.  No wound leaks were noted. Moxifloxacin  was injected intracamerally.  Timolol  and Brimonidine  drops were applied to the eye.  The patient was taken to the recovery room in stable condition without complications of anesthesia or surgery.  [employer] 06/29/2024, 10:12 AM

## 2024-06-29 NOTE — H&P (Signed)
 [facility]   Primary Care Physician:  Alla Amis, MD Ophthalmologist: Dr. Feliciano Ober  Pre-Procedure History & Physical: HPI:  Elijah Oliver is a 83 y.o. male here for cataract surgery.   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 June 24th, 2006 Dr. Debby Cough   GERD (gastroesophageal reflux disease)    History of prostate cancer 07/02/2017   Ingrowing nail with infection 07/23/2017   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 07/23/2017   Neck sprain 07/23/2017   Neoplasm of uncertain behavior of skin 07/23/2017   Nephrolithiasis 07/23/2017   Pure hypercholesterolemia 07/02/2017   Seizure disorder (HCC) 07/02/2017   Shoulder strain 07/23/2017   Sleep apnea 07/23/2017   SOB (shortness of breath) 07/23/2017   Stage 3a chronic kidney disease (CKD) (HCC)    Thoracic sprain 07/23/2017   Type 2 diabetes mellitus with diabetic neuropathy, without long-term current use of insulin (HCC) 07/02/2017   Urge incontinence of urine 07/23/2017    Past Surgical History:  Procedure Laterality Date   CARDIAC SURGERY  2006   PROSTATECTOMY  2004   REPLACEMENT TOTAL KNEE  2016   TRANSURETHRAL RESECTION OF BLADDER TUMOR WITH GYRUS (TURBT-GYRUS)  2010?   URETEROSCOPY WITH HOLMIUM LASER LITHOTRIPSY  2010?    Prior to Admission medications   Medication Sig Start Date End Date Taking? Authorizing Provider  aspirin 81 MG chewable tablet Chew by mouth.    [provider]  atorvastatin (LIPITOR) 40 MG tablet Take 1 tablet by mouth daily. 10/28/21   [provider]  carvedilol (COREG) 3.125 MG tablet TAKE 1 TABLET BY MOUTH  TWICE DAILY 03/06/20   [provider]  Cholecalciferol 25 MCG (1000 UT) tablet Take by mouth.    [provider]  glipiZIDE (GLUCOTROL XL) 2.5 MG 24 hr tablet Take 2.5 mg by mouth daily.    [provider]  levETIRAcetam (KEPPRA) 1000 MG tablet Take 1 tablet by mouth 2 (two) times daily. 11/14/20   [provider]  losartan (COZAAR) 50 MG tablet Take 1 tablet by mouth daily. 08/27/20   [provider]  Multiple Vitamins-Minerals (CENTRUM SILVER) tablet Take by mouth.    [provider]  omeprazole (PRILOSEC) 20 MG capsule Take by mouth. 07/02/17   [provider]  oxybutynin  (DITROPAN ) 5 MG tablet Take 1 tablet by mouth 3 (three) times daily. 12/24/21   [provider]  Polyethylene Glycol 3350 (MIRALAX PO) Take by mouth. daily    [provider]    Allergies as of 06/09/2024 - Review Complete 06/05/2024  Allergen Reaction Noted   Other Anaphylaxis 07/02/2017    Family History  Problem Relation Age of Onset   Tuberculosis Father    Prostate cancer Brother    Kidney cancer Neg Hx    Bladder Cancer Neg Hx     Social History   Socioeconomic History   Marital status: Widowed    Spouse name: Not on file   Number of children: Not on file   Years of education: Not on file   Highest education level:  Not on file  Occupational History   Not on file  Tobacco Use   Smoking status: Never   Smokeless tobacco: Never  Substance and Sexual Activity   Alcohol use: No   Drug use: No   Sexual activity: Not on file  Other Topics Concern   Not on file  Social History Narrative   Not on file   Social Drivers of Health   Financial Resource Strain: Low Risk  (10/15/2023)   Received from [facility]   Overall Financial Resource Strain (CARDIA)    Difficulty of Paying Living Expenses: Not hard at all  Food Insecurity: No  Food Insecurity (10/15/2023)   Received from [facility]   Hunger Vital Sign    Within the past 12 months, you worried that your food would run out before you got the money to buy more.: Never true    Within the past 12 months, the food you bought just didn't last and you didn't have money to get more.: Never true  Transportation Needs: No Transportation Needs (10/15/2023)   Received from [facility] - [facility]    In the past 12 months, has lack of transportation kept you from medical appointments or from getting medications?: No    Lack of Transportation (Non-Medical): No  Physical Activity: Not on file  Stress: Not on file  Social Connections: Not on file  Intimate Partner Violence: Not on file    Review of Systems: See HPI, otherwise negative ROS  Physical Exam: Ht 6' (1.829 m)   Wt 97.5 kg   BMI 29.16 kg/m  General:   Alert, cooperative in NAD Head:  Normocephalic and atraumatic. Respiratory:  Normal work of breathing. Cardiovascular:  RRR  Impression/Plan: Elijah Oliver is here for cataract surgery.  Risks, benefits, limitations, and alternatives regarding cataract surgery have been reviewed with the patient.  Questions have been answered.  All parties agreeable.   Feliciano Bryan Ober, MD  06/29/2024, 7:05 AM

## 2024-06-29 NOTE — Transfer of Care (Signed)
 Immediate Anesthesia Transfer of Care Note  Patient: Elijah Oliver  Procedure(s) Performed: PHACOEMULSIFICATION, CATARACT, WITH IOL INSERTION 5.09 00:44.1 (Left: Eye)  Patient Location: PACU  Anesthesia Type: MAC  Level of Consciousness: awake, alert  and patient cooperative  Airway and Oxygen Therapy: Patient Spontanous Breathing and Patient connected to supplemental oxygen  Post-op Assessment: Post-op Vital signs reviewed, Patient's Cardiovascular Status Stable, Respiratory Function Stable, Patent Airway and No signs of Nausea or vomiting  Post-op Vital Signs: Reviewed and stable  Complications: No notable events documented.

## 2024-07-03 ENCOUNTER — Encounter: Payer: Self-pay | Admitting: Urology

## 2024-07-03 ENCOUNTER — Ambulatory Visit: Admitting: Urology

## 2024-07-03 VITALS — BP 159/76 | HR 64 | Ht 72.0 in | Wt 218.0 lb

## 2024-07-03 DIAGNOSIS — Z8551 Personal history of malignant neoplasm of bladder: Secondary | ICD-10-CM

## 2024-07-03 LAB — MICROSCOPIC EXAMINATION: WBC, UA: >30 /HPF — AB (ref 0–5)

## 2024-07-03 LAB — URINALYSIS, COMPLETE
Bilirubin, UA: NEGATIVE
Glucose, UA: NEGATIVE
Ketones, UA: NEGATIVE
Nitrite, UA: POSITIVE — AB
Specific Gravity, UA: 1.02 (ref 1.005–1.030)
Urobilinogen, Ur: 0.2 mg/dL (ref 0.2–1.0)
pH, UA: 6 (ref 5.0–7.5)

## 2024-07-03 MED ORDER — LIDOCAINE HCL URETHRAL/MUCOSAL 2 % EX GEL
1.0000 | Freq: Once | CUTANEOUS | Status: AC
  Administered 2024-07-03: 1 via URETHRAL

## 2024-07-03 MED ORDER — SULFAMETHOXAZOLE-TRIMETHOPRIM 800-160 MG PO TABS
1.0000 | ORAL_TABLET | Freq: Once | ORAL | Status: AC
  Administered 2024-07-03: 1 via ORAL

## 2024-07-03 NOTE — Progress Notes (Signed)
   07/03/24  CC:  Chief Complaint  Patient presents with   Cysto   Urological history: 1. Bladder cancer - HgTa TCC (01/2012) - on annual surveillance   2. Prostate cancer - RRP (2004)    3. 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 Urgency - Myrbetriq  50 gm daily cost prohibitive - oxybutynin  IR 5 mg TID    5. Nephrolithiasis - ESWL x 3 (2013, 2013, 2015) - CT (2018) no stones - RUS (2024) no stones   HPI: No complaints since last visit.  PSA last month remains undetectable <0.1  Blood pressure (!) 159/76, pulse 64, height 6' (1.829 m), weight 218 lb (98.9 kg). NED. A&Ox3.   No respiratory distress   Abd soft, NT, ND Normal phallus with bilateral descended testicles  Cystoscopy Procedure Note  Patient identification was confirmed, informed consent was obtained, and patient was prepped using Betadine solution.  Lidocaine  jelly was administered per urethral meatus.     Pre-Procedure: - Inspection reveals a normal caliber urethral meatus.  Procedure: The flexible cystoscope was introduced without difficulty - No urethral strictures/lesions are present. - Surgically absent prostate  - Open bladder neck - Bilateral ureteral orifices identified - Bladder mucosa  reveals no ulcers, tumors, or lesions - No bladder stones - No trabeculation  Retroflexion shows no abnormalities   Post-Procedure: - Patient tolerated the procedure well  Assessment/ Plan: No recurrent bladder tumor Continue annual surveillance cystoscopy UA today nitrite + with pyuria though patient asymptomatic.  He was given Septra  DS tab Po cystoscopy    Elijah JAYSON Barba, MD

## 2024-09-15 ENCOUNTER — Encounter: Payer: Self-pay | Admitting: Podiatry

## 2024-09-15 ENCOUNTER — Ambulatory Visit: Admitting: Podiatry

## 2024-09-15 DIAGNOSIS — M79675 Pain in left toe(s): Secondary | ICD-10-CM | POA: Diagnosis not present

## 2024-09-15 DIAGNOSIS — B351 Tinea unguium: Secondary | ICD-10-CM

## 2024-09-15 DIAGNOSIS — M2041 Other hammer toe(s) (acquired), right foot: Secondary | ICD-10-CM | POA: Diagnosis not present

## 2024-09-15 DIAGNOSIS — M2042 Other hammer toe(s) (acquired), left foot: Secondary | ICD-10-CM

## 2024-09-15 DIAGNOSIS — M79674 Pain in right toe(s): Secondary | ICD-10-CM | POA: Diagnosis not present

## 2024-09-15 DIAGNOSIS — M2030 Hallux varus (acquired), unspecified foot: Secondary | ICD-10-CM | POA: Diagnosis not present

## 2024-09-15 DIAGNOSIS — L84 Corns and callosities: Secondary | ICD-10-CM

## 2024-09-15 DIAGNOSIS — E1142 Type 2 diabetes mellitus with diabetic polyneuropathy: Secondary | ICD-10-CM | POA: Diagnosis not present

## 2024-09-15 NOTE — Progress Notes (Signed)
 Subjective:  Patient ID: Elijah Oliver, male    DOB: [birth year],  MRN: [id number]  Elijah Oliver presents to clinic today for at risk foot care with history of diabetic neuropathy and callus(es) submet head 1 left foot and painful mycotic toenails that are difficult to trim. Painful toenails interfere with ambulation. Aggravating factors include wearing enclosed shoe gear. Pain is relieved with periodic professional debridement. Painful calluses are aggravated when weightbearing with and without shoegear. Pain is relieved with periodic professional debridement. Patient states he needs diabetic shoes and has been to [facility]. He spoke to staff there and has received a call to come in. He wants to be sure he has what he needs in order to get his diabetic shoes/inserts. Chief Complaint  Patient presents with   Toe Pain    A1c is 6.9. His PCP is [place]. Last visit was in Oct 2025   New problem(s): None.   PCP is Alla Amis, MD.  Allergies  Allergen Reactions   Other Anaphylaxis    Oriental food- anaphylaxis    Review of Systems: Negative except as noted in the HPI.  Objective:  There were no vitals filed for this visit. Elijah Oliver is a pleasant 83 y.o. male in NAD. AAO x 3.  Vascular Examination: Capillary refill time immediate b/l. Palpable pedal pulses. Pedal hair absent b/l. No pain with calf compression b/l. Skin temperature gradient WNL b/l. No cyanosis or clubbing b/l. No ischemia or gangrene noted b/l. No edema noted b/l LE.  Neurological Examination: Pt has subjective symptoms of neuropathy. Protective sensation diminished with 10g monofilament b/l.  Dermatological Examination: Pedal skin with normal turgor, texture and tone b/l.  No open wounds. No interdigital macerations.   Toenails 1-5 b/l thick, discolored, elongated with subungual debris and pain on dorsal palpation.   No corns, calluses nor porokeratotic lesions noted.  Musculoskeletal  Examination: Muscle strength 5/5 to all lower extremity muscle groups bilaterally. Hallux hammertoe noted b/l LE. Hammertoe(s) 2-5 b/l.  Radiographs: None    Assessment/Plan: 1. Pain due to onychomycosis of toenails of both feet   2. Hallux malleus, unspecified laterality   3. Hammer toes of both feet   4. Callus   5. Diabetic polyneuropathy associated with type 2 diabetes mellitus (HCC)    Orders Placed This Encounter  Procedures   For home use only DME Other see comment    To [facility]: Dispense one pair extra depth shoes and 3 pair custom insoles. Offload callus submet head 1 left foot.    Length of Need:   12 Months  Patient was evaluated and treated. All patient's and/or POA's questions/concerns addressed on today's visit. Toenails 1-5 debrided in length and girth without incident. Callus(es) submet head 1 left foot pared with sharp debridement without incident. Continue daily foot inspections and monitor blood glucose per PCP/Endocrinologist's recommendations. Continue soft, supportive shoe gear daily. Report any pedal injuries to medical professional. Call office if there are any questions/concerns. -Rx written to [facility] for one pair diabetic shoes and 3 pair custom insoles. To [facility] [address] [place], [place] [address] Phone: [phone] Fax: [phone].  Return in about 3 months (around 12/16/2024).  Elijah Oliver, DPM      [place] LOCATION: [address]  [place], [place] [address]                   Office [phone]   [facility] LOCATION: [address] [place], [place] [address] Office [phone]

## 2024-12-25 ENCOUNTER — Ambulatory Visit: Admitting: Podiatry

## 2025-02-19 ENCOUNTER — Ambulatory Visit: Admitting: Podiatry

## 2025-07-04 ENCOUNTER — Other Ambulatory Visit: Admitting: Urology
